# Patient Record
Sex: Male | Born: 1935 | Race: White | Hispanic: No | Marital: Married | Smoking: Former smoker
Health system: Southern US, Community
[De-identification: ages and names within clinical notes are randomized; demographics above are authoritative.]

## PROBLEM LIST (undated history)

## (undated) DIAGNOSIS — R0609 Other forms of dyspnea: Secondary | ICD-10-CM

## (undated) DIAGNOSIS — E039 Hypothyroidism, unspecified: Secondary | ICD-10-CM

## (undated) DIAGNOSIS — E7800 Pure hypercholesterolemia, unspecified: Secondary | ICD-10-CM

## (undated) DIAGNOSIS — I2510 Atherosclerotic heart disease of native coronary artery without angina pectoris: Secondary | ICD-10-CM

## (undated) DIAGNOSIS — J449 Chronic obstructive pulmonary disease, unspecified: Secondary | ICD-10-CM

## (undated) DIAGNOSIS — R200 Anesthesia of skin: Secondary | ICD-10-CM

## (undated) DIAGNOSIS — I219 Acute myocardial infarction, unspecified: Secondary | ICD-10-CM

## (undated) DIAGNOSIS — D649 Anemia, unspecified: Secondary | ICD-10-CM

## (undated) DIAGNOSIS — T4145XA Adverse effect of unspecified anesthetic, initial encounter: Secondary | ICD-10-CM

## (undated) DIAGNOSIS — E079 Disorder of thyroid, unspecified: Secondary | ICD-10-CM

## (undated) DIAGNOSIS — E785 Hyperlipidemia, unspecified: Secondary | ICD-10-CM

## (undated) DIAGNOSIS — T39395A Adverse effect of other nonsteroidal anti-inflammatory drugs [NSAID], initial encounter: Secondary | ICD-10-CM

## (undated) DIAGNOSIS — I209 Angina pectoris, unspecified: Secondary | ICD-10-CM

## (undated) DIAGNOSIS — R202 Paresthesia of skin: Secondary | ICD-10-CM

## (undated) DIAGNOSIS — K922 Gastrointestinal hemorrhage, unspecified: Secondary | ICD-10-CM

## (undated) DIAGNOSIS — Z9289 Personal history of other medical treatment: Secondary | ICD-10-CM

## (undated) HISTORY — PX: WRIST HARDWARE REMOVAL: SHX2673

## (undated) HISTORY — PX: WRIST FRACTURE SURGERY: SHX121

## (undated) HISTORY — PX: CARDIAC CATHETERIZATION: SHX172

## (undated) HISTORY — PX: CORONARY ANGIOPLASTY WITH STENT PLACEMENT: SHX49

---

## 1985-05-08 DIAGNOSIS — I219 Acute myocardial infarction, unspecified: Secondary | ICD-10-CM

## 1985-05-08 DIAGNOSIS — T8859XA Other complications of anesthesia, initial encounter: Secondary | ICD-10-CM

## 1985-05-08 HISTORY — DX: Acute myocardial infarction, unspecified: I21.9

## 1985-05-08 HISTORY — DX: Other complications of anesthesia, initial encounter: T88.59XA

## 1985-05-08 HISTORY — PX: CORONARY ARTERY BYPASS GRAFT: SHX141

## 2001-09-03 ENCOUNTER — Encounter: Payer: Self-pay | Admitting: Family Medicine

## 2001-09-03 ENCOUNTER — Encounter: Admission: RE | Admit: 2001-09-03 | Discharge: 2001-09-03 | Payer: Self-pay | Admitting: Family Medicine

## 2001-10-16 ENCOUNTER — Encounter: Admission: RE | Admit: 2001-10-16 | Discharge: 2001-10-16 | Payer: Self-pay | Admitting: Family Medicine

## 2001-10-16 ENCOUNTER — Encounter: Payer: Self-pay | Admitting: Family Medicine

## 2002-09-04 ENCOUNTER — Ambulatory Visit (HOSPITAL_BASED_OUTPATIENT_CLINIC_OR_DEPARTMENT_OTHER): Admission: RE | Admit: 2002-09-04 | Discharge: 2002-09-05 | Payer: Self-pay | Admitting: Orthopedic Surgery

## 2002-09-05 ENCOUNTER — Encounter (INDEPENDENT_AMBULATORY_CARE_PROVIDER_SITE_OTHER): Payer: Self-pay | Admitting: Specialist

## 2003-02-17 ENCOUNTER — Ambulatory Visit (HOSPITAL_BASED_OUTPATIENT_CLINIC_OR_DEPARTMENT_OTHER): Admission: RE | Admit: 2003-02-17 | Discharge: 2003-02-17 | Payer: Self-pay | Admitting: Orthopedic Surgery

## 2003-04-28 ENCOUNTER — Ambulatory Visit (HOSPITAL_COMMUNITY): Admission: RE | Admit: 2003-04-28 | Discharge: 2003-04-28 | Payer: Self-pay | Admitting: Cardiology

## 2003-06-08 ENCOUNTER — Ambulatory Visit (HOSPITAL_COMMUNITY): Admission: RE | Admit: 2003-06-08 | Discharge: 2003-06-09 | Payer: Self-pay | Admitting: Cardiology

## 2003-07-23 ENCOUNTER — Encounter: Payer: Self-pay | Admitting: Cardiology

## 2003-07-23 ENCOUNTER — Ambulatory Visit (HOSPITAL_COMMUNITY): Admission: RE | Admit: 2003-07-23 | Discharge: 2003-07-23 | Payer: Self-pay | Admitting: Cardiology

## 2003-07-27 ENCOUNTER — Inpatient Hospital Stay (HOSPITAL_COMMUNITY): Admission: RE | Admit: 2003-07-27 | Discharge: 2003-07-28 | Payer: Self-pay | Admitting: Cardiology

## 2003-08-10 ENCOUNTER — Encounter: Admission: RE | Admit: 2003-08-10 | Discharge: 2003-08-10 | Payer: Self-pay | Admitting: Family Medicine

## 2003-09-03 ENCOUNTER — Inpatient Hospital Stay (HOSPITAL_COMMUNITY): Admission: AD | Admit: 2003-09-03 | Discharge: 2003-09-07 | Payer: Self-pay | Admitting: Cardiology

## 2004-03-22 ENCOUNTER — Ambulatory Visit (HOSPITAL_COMMUNITY): Admission: RE | Admit: 2004-03-22 | Discharge: 2004-03-22 | Payer: Self-pay | Admitting: Orthopedic Surgery

## 2004-03-22 ENCOUNTER — Encounter (INDEPENDENT_AMBULATORY_CARE_PROVIDER_SITE_OTHER): Payer: Self-pay | Admitting: *Deleted

## 2004-03-22 ENCOUNTER — Ambulatory Visit (HOSPITAL_BASED_OUTPATIENT_CLINIC_OR_DEPARTMENT_OTHER): Admission: RE | Admit: 2004-03-22 | Discharge: 2004-03-22 | Payer: Self-pay | Admitting: Orthopedic Surgery

## 2006-02-23 ENCOUNTER — Inpatient Hospital Stay (HOSPITAL_COMMUNITY): Admission: AD | Admit: 2006-02-23 | Discharge: 2006-02-26 | Payer: Self-pay | Admitting: Internal Medicine

## 2006-05-04 ENCOUNTER — Encounter (INDEPENDENT_AMBULATORY_CARE_PROVIDER_SITE_OTHER): Payer: Self-pay | Admitting: Specialist

## 2006-05-04 ENCOUNTER — Ambulatory Visit (HOSPITAL_BASED_OUTPATIENT_CLINIC_OR_DEPARTMENT_OTHER): Admission: RE | Admit: 2006-05-04 | Discharge: 2006-05-04 | Payer: Self-pay | Admitting: Orthopedic Surgery

## 2006-07-16 ENCOUNTER — Encounter: Admission: RE | Admit: 2006-07-16 | Discharge: 2006-07-16 | Payer: Self-pay | Admitting: Family Medicine

## 2006-07-25 ENCOUNTER — Encounter: Admission: RE | Admit: 2006-07-25 | Discharge: 2006-07-25 | Payer: Self-pay | Admitting: Family Medicine

## 2008-10-30 ENCOUNTER — Encounter: Admission: RE | Admit: 2008-10-30 | Discharge: 2008-10-30 | Payer: Self-pay | Admitting: Orthopedic Surgery

## 2008-11-13 ENCOUNTER — Encounter: Admission: RE | Admit: 2008-11-13 | Discharge: 2008-11-13 | Payer: Self-pay | Admitting: Orthopedic Surgery

## 2008-11-17 ENCOUNTER — Ambulatory Visit (HOSPITAL_BASED_OUTPATIENT_CLINIC_OR_DEPARTMENT_OTHER): Admission: RE | Admit: 2008-11-17 | Discharge: 2008-11-18 | Payer: Self-pay | Admitting: Orthopedic Surgery

## 2009-05-08 HISTORY — PX: COLONOSCOPY W/ POLYPECTOMY: SHX1380

## 2010-01-15 ENCOUNTER — Emergency Department (HOSPITAL_COMMUNITY): Admission: EM | Admit: 2010-01-15 | Discharge: 2010-01-15 | Payer: Self-pay | Admitting: Emergency Medicine

## 2010-05-08 HISTORY — PX: CATARACT EXTRACTION W/ INTRAOCULAR LENS  IMPLANT, BILATERAL: SHX1307

## 2010-09-20 NOTE — Op Note (Signed)
NAMECARLITO, [name]                ACCOUNT NO.:  [id number]   MEDICAL RECORD NO.:  [id number]          PATIENT TYPE:  AMB   LOCATION:  DSC                          FACILITY:  [facility]   PHYSICIAN:  [name]. [name], M.D. DATE OF BIRTH:  Oct 26, 1933   DATE OF PROCEDURE:  11/17/2008  DATE OF DISCHARGE:                               OPERATIVE REPORT   PREOPERATIVE DIAGNOSES:  1. Symptomatic os acromiale left shoulder.  2. MRI documented full-thickness rotator cuff tear supraspinatus,      infraspinatus tendons left shoulder.  3. Mild acromioclavicular degenerative arthritis.  4. Rule out significant biceps pathology.   POSTOPERATIVE DIAGNOSES:  1. Significant adhesive capsulitis.  2. Symptomatic os acromiale.  3. Full-thickness rotator cuff tear.  4. Modest biceps pathology and degenerative labral pathology at 12      o'clock, 1 o'clock, and 2 o'clock anteriorly.   OPERATION:  1. Examination of the left shoulder under anesthesia.  2. Arthroscopic capsulectomy and debridement of labrum and biceps as      well as rotator cuff debridement.  3. Arthroscopic subacromial decompression with os plasty and removal      of osteophyte on posterior portion of acromion with preservation of      acromioclavicular joint capsule to maintain stability of os      acromiale.  4. A hybrid repair of rotator cuff with arthroscopic debridement, use      of arthroscopic burs to decorticate greater tuberosity and open      suture placement through a limited muscle-splitting incision.   SURGEON:  [name]. [name], MD   ASSISTANT:  Annye Rusk, PA-C   ANESTHESIA:  General endotracheal supplemented by an interscalene block.   SUPERVISING ANESTHESIOLOGIST:  [name]. [name], MD   INDICATIONS:  [name] is a 77 year old gentleman well acquainted  with our practice.   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  He presented for evaluation  of a chronically painful left shoulder.  Clinical examination revealed  mild adhesive capsulitis and weakness of abduction and external  rotation.  Plain x-rays revealed an os acromiale.  An MRI confirmed a  rotator cuff tear with full-thickness and modestly retracted,  unfavorable AC anatomy and particularly unfavorable hypertrophic  osteophytes and an unstable os acromiale.   We advised [name] to undergo diagnostic arthroscopy anticipating  probable capsulectomy, debridement of his labrum, debridement of his  cuff, subacromial decompression, debridement of osteophytes on the  adjacent surface of the os acromiale, a choice not to resect the distal  clavicle to maintain stability of the os anterior fragment and rotator  cuff repair.   Given his choice to continue on a lifting capacity we will have a low  threshold to perform a hybrid repair with open suture placement for  maximum strength.   After informed consent [name] is brought to the operating room at  this time.  PROCEDURE:  [name] is brought to the operating room and placed in  supine position upon operating table.   Following an anesthesia consult with Dr. [name], general anesthesia  by endotracheal technique was recommended and accepted.   A left interscalene block was placed in the holding area under Dr.  Thornton Dales direct supervision followed by transfer of [name] to the  operating room and placement in the supine position.  General  endotracheal anesthesia was induced under Dr. Thornton Dales direct  supervision followed by careful positioning in a beach-chair position  with aid of a torso and head holder designed for shoulder arthroscopy.   The entire left upper extremity forequarter prepped with DuraPrep and  draped with impervious  arthroscopy drapes.   The procedure commenced with examination of left shoulder under  anesthesia.  [name] had rather substantial adhesive capsulitis under  anesthesia.  Combined elevation was limited at 155 degrees, external  rotation limited at 10 degrees, internal rotation limited at  approximately 20 degrees.  We did not manipulate his shoulder due to his  os acromiale and documented rotator cuff tear.   The arthroscope was placed with a standard blunt technique through a  posterior portal.  Diagnostic arthroscopy confirmed abundant adhesive  capsulitis granulation tissues and adhesions between the subscapularis  middle glenohumeral ligament and the anterior capsule.  There was a  moderate degree of degenerative type 1 SLAP injury to the labrum and  about a 15% degenerative tear of the biceps adjacent to the superior  labrum.   An anterior portal was created under direct vision followed by use of  suction shaver to debride the adhesive capsulitis tissues, augmented by  use of electrocautery with tenolysis of the subscapularis, release of  the adhesions to the glenohumeral ligaments and capsulectomy.  The  external rotation was increased to 70 degrees after capsular release and  debridement.  The biceps was debrided as was the deep surface rotator  cuff.   The scope was then removed from the glenohumeral joint and placed in a  subacromial space.  There was noted to be a very hypertrophic os  acromiale articulation that was easily visualized from within the bursa.  There was no capsule present on the bursal side.  The AC capsule was  intact and appeared to be stable.   After use of electrocautery to define the margins of the os acromiale  articulation, a suction bur was used to level the osteophyte on the  anterior and adjacent posterior fragments.  Care was taken to preserve  all the capsular attachments superiorly.  The suction shaver was then  used to debride bursa and the  rotator cuff tear easily visualized.  There was a two tendon tear involving most of the supraspinatus and the  anterior fibers of the infraspinatus.  This was debrided with a suction  shaver.   Given Mr. Sally choice to be an active worker at age 50 and a lifting  capacity in my judgment open technique with precise placement of anchors  and a double-row repair was indicated.   We proceeded to complete decortication of the footprint for rotator cuff  repair with the arthroscopic bur followed by a muscle-splitting  incision.  A suction shaver was used to debride redundant bursa.  The  resection at the os acromiale was very satisfactory.   A two row repair was created with a 5.5-mm PEEK corkscrews placed at the  medial footprint followed by placement of four mattress sutures that  were tied with open technique followed by use of a swivel lock  posteriorly and a PushLock anteriorly for an over-the-top crisscross  repair.  An excellent footprint was achieved.  The dog ears were  debrided with a rongeur.  The scope was then replaced in the joint  assuring that there were no sutures following the long head of the  biceps or causing other problems.  The joint was thoroughly lavaged as  was subacromial space.  The muscle split was repaired with simple suture  of 0 Vicryl followed by repair of the skin with subcutaneous suture of 2-  0 Vicryl and intradermal 3-0 Prolene.   Mr. Ewart tolerated the surgery and anesthesia well.  He was placed in a  DonJoy shoulder immobilizer, awakened from general anesthesia and  transferred to the recovery room with stable vital signs.   For aftercare he will be provided p.o. and IV Dilaudid as well as IV  Ancef 1 gram q.8 hours x3 doses as a prophylactic antibiotic.      [name] [name], M.D.  Electronically Signed     RVS/MEDQ  D:  11/17/2008  T:  11/18/2008  Job:  [id number]   cc:   Donia Guiles, M.D.  Nanetta Batty, M.D.

## 2010-09-23 NOTE — Op Note (Signed)
[name], [name]                          ACCOUNT NO.:  [id number]   MEDICAL RECORD NO.:  [id number]                   PATIENT TYPE:  INP   LOCATION:  2001                                 FACILITY:  [facility]   PHYSICIAN:  Graylin Shiver, M.D.                DATE OF BIRTH:  08/18/1933   DATE OF PROCEDURE:  09/07/2003  DATE OF DISCHARGE:  09/07/2003                                 OPERATIVE REPORT   PROCEDURE PERFORMED:  Colonoscopy.   INDICATIONS FOR PROCEDURE:  Iron deficiency anemia.   Informed consent was obtained after explanation of the risks of bleeding,  infection, and perforation.   PREMEDICATIONS:  The procedure was done immediately after an  esophagogastroduodenoscopy with no additional medication given.   DESCRIPTION OF PROCEDURE:  With the patient in the left lateral decubitus  position, a rectal exam was performed and no masses were felt.  The Olympus  colonoscope was inserted into the rectum and advanced around the colon to  the cecum.  Cecal landmarks were identified.  The cecum and ascending colon  were normal.  The transverse colon normal.  The descending colon, sigmoid  and rectum were normal.  The patient tolerated the procedure well without  complications.   IMPRESSION:  Normal colonoscopy to the cecum.                                               Graylin Shiver, M.D.    [name]  D:  09/08/2003  T:  09/08/2003  Job:  [id number]

## 2010-09-23 NOTE — Op Note (Signed)
NAMEEDU, ON                ACCOUNT NO.:  [id number]   MEDICAL RECORD NO.:  [id number]          PATIENT TYPE:  AMB   LOCATION:  DSC                          FACILITY:  [facility]   PHYSICIAN:  [name]. 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  [name] is also status post a second procedure for removal of one AcuTrak  screw and removal of a loose body noted on the dorsal aspect of his left  wrist with release of the left ring finger A-1 pulley on February 17, 2003.  He subsequently did well for one year until recently developing acute pain  in his left wrist.  He was seen on February 10, 2004 at which time x-ray  suggested a mature fusion at his midcarpal joint, however, he had  progressive narrowing at the dorsal aspect of his radiocarpal articulation,  primarily the radiolunate articulation, suggestive of progressive  radiocarpal arthrosis with pain despite what appeared to be a satisfactory  capitolunate arthrodesis with a standard AcuTrak screw.  A lidocaine block  of the wrist in the office completely relieved his pain.  I recommended that  he proceed with arthrodesis of his wrist.  He thought this over for a period  of four weeks and elects to proceed with arthrodesis of his left wrist at  this time anticipating a possible autogenous iliac graft versus allograft.   Preoperatively, we advised that we may remove the AcuTrak screw  if it  impedes our ability to obtain a satisfactory internal fixation construct.  [name] was status post coronary intervention in the spring of  2005.  Therefore, we obtained a cardiac clearance from Antionette Char, MD at  [facility].  This was obtained on February 15, 2004 and is  currently documented in the chart.  After informed consent [name] is  brought to the operating room at this time anticipating radiocarpal  arthrodesis with autogenous and/or allograft bone graft.   Preoperatively, he was apprised of the potential risks and benefits  including infection, possible failure of the hardware, possible failure to  obtain a satisfactory fusion and the need for possible secondary procedures.  Questions were invited and answered.   POSTOPERATIVE DIAGNOSIS:   OPERATION PERFORMED:   SURGEON:  [name]. [name], M.D.   DESCRIPTION OF PROCEDURE:  [name] was brought to the operating room  and placed in supine position on the operating table. Following induction of  general anesthesia by endotracheal technique, the left arm was prepped with  Betadine soap and solution and sterilely draped with a pneumatic tourniquet  upon the proximal brachium.  The left anterior iliac crest region was  likewise prepped with Betadine soap and solution and sterilely  draped with  towels and an [place] Steri-drape.   The procedure commenced with exsanguination of the left hand and arm with an  Esmarch bandage and inflation of the arterial tourniquet to 220 mmHg.  The  prior dorsal surgical scar was excised and the extensor tendons exposed by  release of the retinaculum on the radial aspect of the fourth dorsal  compartment.  The extensor tendons were retracted in an ulnar direction and  the dorsal aspect of the carpus explored.  The third dorsal compartment was  released and the extensor pollicis longus retracted radially.  A  subperiosteal elevation of the second dorsal compartment allowed inspection  of the radiocarpal articulation.  The space created by prior resection of  the scaphoid had filled in with scar.  This was  carefully resected with a  rongeur.  The midcarpal fusion had what appeared to be a secondary fracture  with probable avascular necrosis of the dorsal aspect of the lunate.  The  AcuTrak screw was slightly loose within the lunate. There was clearly false  motion present.  This is distinctly different from what we identified at the  time of his procedure of October 2004.  I question whether or not the same  microvascular process that led to the Prisers disease may have now claimed  the lunate.   The lunate was morcellized on its dorsal aspect to create a bone graft and  the articular surface of the capitate, lunate and distal radius were all  meticulously cleared of hyaline cartilage utilizing a curette and a small  osteotome.  A small osteotome was then used to fish scale the end of the  radius to bleeding cancellous bone and likewise the proximal pole of the  lunate and exposed portion of capitate were thoroughly roughened with an  osteotome and fine rongeur.  The AcuTrak screw was unwound with a hemostat  and removed.  A short ASIF titanium wrist fusion plate was selected the  appropriate size and the distal radius contoured with resection of Lister's  tubercle and a dorsal wedge of bone to appropriately contour the plate.  The  bone graft was morcellized and used to fill the cavity created by scaphoid  excision.  The wrist was placed at approximately 15 degrees of dorsiflexion  and neutral deviation and the plate was applied with standard ASIF technique  utilizing x-ray control of screw length and plate position.  The bone graft  was then tamped tightly and additional allograft was used to pack into all  the remaining spaces at the capsule at the site of the prior scaphoid  excision.  The capsule was then repaired over the plate with mattress  sutures of 3-0 Ethibond and the extensor retinaculum was reconstructed utilizing a rotation flap of the radial and ulnar portions of the   retinaculum to accommodate the bulk from the plate.  The wound was  thoroughly lavaged with sterile saline followed by release of the  tourniquet.  Some venous bleeding proximally was controlled with a bipolar  forceps.  Once hemostasis was achieved, the wound was closed with subdermal  sutures of 4-0 Vicryl and intradermal 3-0 Prolene.  A voluminous gauze  dressing was applied with a sugar tong splint maintaining the wrist in a  slightly supinated posture.  There were no apparent complications.   TOURNIQUET TIME:  81 minutes at 220 mmHg.   ESTIMATED BLOOD LOSS:  20 mL.   [name] will be admitted to the recovery care center for  observation of  his vital signs.  We anticipate IV PCA morphine and p.o. and IV Dilaudid for  pain as well as Ancef 1 g IV every eight hours as an IV prophylactic  antibiotic.  We anticipate discharge in 24 hours.  We have encouraged him to  avoid smoking and other activities that may impair fusion.      Robe   RVS/MEDQ  D:  03/22/2004  T:  03/22/2004  Job:  [id number]

## 2010-09-23 NOTE — Op Note (Signed)
[name], DUROSS                          ACCOUNT NO.:  [id number]   MEDICAL RECORD NO.:  [id number]                   PATIENT TYPE:  AMB   LOCATION:  DSC                                  FACILITY:  [facility]   PHYSICIAN:  [name]. [name]., M.D.          DATE OF BIRTH:  [birth year]   DATE OF PROCEDURE:  02/17/2003  DATE OF DISCHARGE:                                 OPERATIVE REPORT   PREOPERATIVE DIAGNOSES:  1. Chronic pain, left dorsal wrist with blue spotting noted on the dorsal     aspect of the lunate and scaphoid excision cavity.  2. 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  3. Retained hardware, status post ulnar forebone fusion.  4. Chronic stenosing tenosynovitis, left ring finger at A1 pulley.   POSTOPERATIVE DIAGNOSES:  1. Chronic pain, left dorsal wrist with blue spotting noted on the dorsal     aspect of the lunate and scaphoid excision cavity.  2. 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  3. Retained hardware, status post ulnar forebone fusion.  4. Chronic stenosing tenosynovitis, left ring finger at A1 pulley.   OPERATION:  1. Through a dorsal incision arthrotomy of the left wrist, with removal of     an 8.0 mm x 6.0 mm osseous loose body with synovectomy of scaphoid facet.  2. Pisiform excision through separate left volar ulnar incision.  3. Removal of Acutrak screw through the volar ulnar incision.  4. Release of A1 pulley, left ring finger through third separate palmar     incision.   SURGEON:  [name]. [name], M.D.   ASSISTANT:  [name]. [name], P.A.-C.   ANESTHESIA:  General by LMA.   ANESTHESIOLOGIST:  [name]. [name], M.D.   INDICATIONS FOR PROCEDURE:  The patient is a 77 year old gentleman referred  for the evaluation and management of a painful left wrist.  A clinical  examination revealed a chronic slack wrist deformity.  He also was noted to  have mild stenosing  tenosynovitis.  In April 2004, he underwent a scaphoid excision and ulnar forebone fusion.  Postoperatively he had some residual dorsal pain and swelling, and was noted  to have a loose body on the dorsal radial aspect of his lunate.  This was  observed for six months, and continued to be painful.  During his  rehabilitation he also complained of pain on the ulnar aspect of his wrist,  and was noted to have pisotriquetral arthrosis.  We recommended proceeding  to the operating room at this time for an arthrotomy on the dorsal aspect of  the wrist, to remove his loose body and a pisiform excision.  We anticipated  a removal of the Acutrak screw from the pisotriquetral approach, and in the  preoperative holding area the patient noted persistent triggering of his  left ring finger.  Therefore he requested that we also proceed with a  release of the A1  pulley and a correction of his chronic stenosing  tenosynovitis.   DESCRIPTION OF PROCEDURE:  The patient is brought to the operating room and  placed in the supine position on the operating room table.  Following the  induction of general anesthesia by LMA, the left arm was prepped with  Betadine soap and solution, and sterilely draped.  A pneumatic tourniquet  was applied to the proximal left arm.  Following the exsanguination of the  left arm with an Esmarch bandage, an arterial tourniquet was placed to 220  mmHg.  The procedure commenced with an excision of a portion of the dorsal  scar from the previous ulnar forebone fusion.  The subcutaneous tissues were  carefully divided, revealing the extensor retinaculum.  This was thickened  to approximately 3.0 mm in width.  The tendons of the fourth dorsal  compartment were retracted in an ulnar direction, and a transverse  arthrotomy was created directly over the lunate facet, taking care to spare  the dorsal radial carpal ligaments.  The loose body was palpated with the  aid of a 2.0 mm  osteotome, and sharp Freers.  This was circumferentially  dissected and excised.  The synovium that had filled the scaphoid facet was resected, to allow  visualization of other possible loose bodies.  This area was carefully  palpated, and no other predicaments were noted.  The wound was thoroughly  irrigated, followed by a repair of the capsule with a suture of #3-0  Ethibond, followed by a repair of the extensor retinaculum with mattress  sutures of #3-0 Ethibond.  The knots buried, and the repair of the skin with  intradermal #3-0 Prolene.  Attention was then directed to the palmar aspect of the wrist.  A  curvilinear incision was fashioned along the ulnar proximal border of the  pisiform.  This was developed, revealing the flexor carpal ulnaris.  A fine  osteotome was used to perform a subperiosteal, subcapsular, and subtendinous  resection of the pisiform.  The pisiform was defined on its palmar, radial,  distal, proximal and ulnar surfaces, followed by the use of a rongeur to  remove it piecemeal.  The removal of a portion of the high articular  cartilage of the triquetrum that was no longer articulating allowed  visualization of the Extract screw.  The buried screw was then removed with  the aid of an Extract solid screwdriver without difficulty.  The wound was  thoroughly lavaged with sterile saline, followed by repair of the flexor  carpi ulnaris to the fascia, supporting the tendon, with a mattress suture  of #3-0 Ethibond.  The hypothenar muscles that were split slightly to  facilitate pisiform excision were repaired with a mattress suture of #3-0  Ethibond, knot buried.  The skin was then repaired with intradermal #3-0  Prolene.  Attention was then directed to the palm.  A short transverse incision was  created directly over the palpably thickened A1 pulley of the left ring  finger.  The subcutaneous tissues were carefully divided, revealing hypertrophic palmar fascia.  The  fascia was dissected.  The A1 pulley was  isolated and split with scissors.  A small A0 pulley was noted and resected.  Thereafter a free range of motion of the fingers recovered.  The tendons  were delivered an incidental synovectomy was accomplished with rongeurs.  The wound was then repaired with interrupted sutures of #5-0 nylon.  A  compressive dressing was applied with a volar plaster splint, maintaining  the  wrist in 5 degrees of dorsiflexion.                                                 [name] [name]., M.D.    RVS/MEDQ  D:  02/17/2003  T:  02/17/2003  Job:  [id number]

## 2010-09-23 NOTE — Op Note (Signed)
[name], [name]                          ACCOUNT NO.:  [id number]   MEDICAL RECORD NO.:  [id number]                   PATIENT TYPE:  INP   LOCATION:  2001                                 FACILITY:  [facility]   PHYSICIAN:  Graylin Shiver, M.D.                DATE OF BIRTH:  January 21, 1934   DATE OF PROCEDURE:  09/07/2003  DATE OF DISCHARGE:  09/07/2003                                 OPERATIVE REPORT   PROCEDURE:  Esophagogastroduodenoscopy with biopsy for CLOtest.   INDICATIONS FOR PROCEDURE:  Iron deficiency anemia.   CONSENT:  Informed consent was obtained after explanation of the risks of  bleeding, infection, and perforation.   PREMEDICATION:  Fentanyl 60 mcg IV, Versed 6 mg IV.   PROCEDURE IN DETAIL:  With the patient in the left lateral decubitus  position, the Olympus gastroscope was inserted into the oropharynx and  passed into the esophagus.  It was advanced down the esophagus and into the  stomach and into the duodenum.  The second portion of the bulb and the  duodenum were normal.  The stomach showed a small antral erosion.  There was  also a focal area of erythema compatible with gastritis along the greater  curvature.  No other abnormalities were seen in the stomach.  The fundus and  cardia looked normal.  The esophagus looked normal.  Biopsy for CLOtest was  obtained from the stomach.  The patient tolerated the procedure well without  complications.   IMPRESSION:  One small antral erosion and a focal area of gastritis in the  stomach.   PLAN:  The CLOtest will be checked.                                               Graylin Shiver, M.D.    [name]  D:  09/08/2003  T:  09/08/2003  Job:  [id number]

## 2010-09-23 NOTE — Discharge Summary (Signed)
[name], [name]                          ACCOUNT NO.:  [id number]   MEDICAL RECORD NO.:  [id number]                   PATIENT TYPE:  INP   LOCATION:  2001                                 FACILITY:  [facility]   PHYSICIAN:  Jackie Plum, M.D.             DATE OF BIRTH:  02-Apr-1934   DATE OF ADMISSION:  09/03/2003  DATE OF DISCHARGE:  09/07/2003                                 DISCHARGE SUMMARY   DISCHARGE DIAGNOSES:  1. Iron deficiency anemia.     A. Esophagogastroduodenoscopy done by Dr. Evette Cristal on Sep 07, 2003, notable        for antral erosion and focal area of gastritis in the stomach.        CLOtest results is pending.  Outpatient follow-up recommended.     B. Colonoscopy done by Dr. Evette Cristal on Sep 07, 2003, was within normal        limits.  The patient is being discharged home on iron with follow-up        appointment to see Dr. Evette Cristal in two weeks.  2. History of coronary artery disease, status post stenting.  3. History of inguinal hernia.   DISCHARGE MEDICATIONS:  The patient is to resume all of his preadmission  medications.  New medicines include:  1. Iron sulfate 325 mg p.o. t.i.d.  2. Protonix 40 mg p.o. daily.   ACTIVITY:  As tolerated.   DIET:  Cardiac diet.   DISCHARGE INSTRUCTIONS:  The patient is to call M.D. with any symptoms of  feeling dizzy, shortness of breath, chest pain, dark-colored stools, or  bright red blood per rectum.  He will follow up with Dr. Evette Cristal as stated  above.  He is to follow up with cardiologist, Dr. Aleen Campi in two weeks.  The patient is to call for appointment.   REASON FOR ADMISSION:  Symptomatic anemia, rule out MI.  The patient is a 77-  year-old gentleman with a history of CAD, who presented with chest pain,  tightness, and dyspnea.  He has experienced some easy fatigability and  dyspnea on exertion.  The patient was noted to have a hemoglobin of 6.9  which was macrocytic in nature and was admitted for further evaluation of  his  chest pressure and anemia.  He had been on Plavix and aspirin.   HOSPITAL COURSE:  Admitted to the [facility].  There was no  evidence of coffeeground emesis, melena, or hematochezia while he was in the  hospital.  His hemodynamics remained stable.  Iron studies were positive for  iron deficiency anemia.  He was seen by Dr. Aleen Campi who agreed that the  patient's symptoms were likely secondary to his anemia and recommended GI  follow-up.  EGD and colonoscopy were done with results as indicated above.  The patient is discharged home in stable and satisfactory condition.  He was  cleared to be okay to take his Plavix and is  going to be following up with  Dr. Evette Cristal as noted in about two weeks.   CONSULTATIONS:  Dr. Aleen Campi and Dr. Evette Cristal.   PROCEDURE:  EGD and colonoscopy as stated above.   CONDITION ON DISCHARGE:  Improved and satisfactory.                                                Jackie Plum, M.D.    GO/MEDQ  D:  10/29/2003  T:  10/29/2003  Job:  [id number]   cc:   Aram Candela. Aleen Campi, M.D.  [address]  [place]  [place] [address]  Fax: [phone]   Graylin Shiver, M.D.  [phone] [address]  [address]  [place], [place] [address]  Fax: [phone]

## 2010-09-23 NOTE — H&P (Signed)
[name], [name]                          ACCOUNT NO.:  [id number]   MEDICAL RECORD NO.:  [id number]                   PATIENT TYPE:  INP   LOCATION:  2001                                 FACILITY:  [facility]   PHYSICIAN:  [name], D.O.                 DATE OF BIRTH:  April 15, 1934   DATE OF ADMISSION:  09/03/2003  DATE OF DISCHARGE:                                HISTORY & PHYSICAL   PRIMARY CARE PHYSICIAN:  Donia Guiles, M.D.   CARDIOLOGIST:  Aram Candela. Tysinger, M.D.   CHIEF COMPLAINT:  Chest tightness.   HISTORY OF PRESENT ILLNESS:  This is a 77 year old Caucasian male with  history of coronary artery disease, status post recent PTCA and stenting of  his LAD on July 27, 2003 when he presented with anginal symptoms and had  not had resolution of these symptoms since.  He describes exertional chest  pain described across his chest, similar to previous pain associated with  his MI back in January 2005.  He states that he is unable to walk even 50-60  feet without becoming severely short of breath.  He was being evaluated in  his cardiologist's office today and being set up for a cardiac  catheterization on Sep 06, 2003 when repeat screening labs revealed his  hemoglobin to be low, at least 7.2.  I do not have these values with me  right now.  Dr. Adelene Idler office called me and asked me to admit and  evaluate [name] in the hospital, and transfuse him as indicated.  Mr.  [name] has had no complaints of bright red blood per rectum or hematemesis,  melena, coffee grounds emesis.  He denies any nausea, vomiting, or diarrhea.  In fact, he reports some constipation.  Probably three or four weeks ago he  noticed some dark stool; otherwise he has been stable with the exception of  his slowly worsening dyspnea on exertion and exertional fatigue.  He states  that he is not quite recovered from his MI in January 2005.  In any event,  he is directly admitted to 2000 for evaluation and  management and  transfusion.   PAST MEDICAL HISTORY:  1. Coronary artery disease with single-vessel CABG prompted by rupture     during a PTCA in 1987.  He has undergone cardiac catheterization in     January 2005 with two stents placed at that time.  Subsequently underwent     cardiac catheterization July 27, 2003, placing a stent in the LAD.     Review of the catheterization report from the last time, his RCA graft     appeared normal.  Left circumflex was normal and reported was a PTCA and     stent in his LAD at the conclusion of that procedure.  2. Hyperlipidemia.   PAST SURGICAL HISTORY:  1. CABG and left wrist surgery in the distant past.  2. He has history of nonincarcerated right inguinal hernia.   MEDICATIONS:  1. Plavix 75 mg p.o. daily.  2. Welchol 625 mg p.o. q.a.m.  3. Aspirin 325 mg p.o. q.a.m.  4. Vytorin 10/50 q.p.m.   ALLERGIES:  No known drug allergies.   SOCIAL HISTORY:  He quit tobacco three to four years ago after 1-1/2 packs  per day for 25 years.  He denies alcohol.  He has three children alive and  well.  He is former Engineer, materials.   FAMILY HISTORY:  His mother died at age 13.  No early heart disease or  cancer that he is aware.  Father died in his 29's of emphysema.   REVIEW OF SYSTEMS:  He denies any fevers, chills, night sweats.  He reports  fatigue mostly associated with exertion.  He denies anorexia, weight loss,  appetite loss. He does report dizziness.  However, this again is with  exertion.  He reports chest pain and tightness across his chest as described  in the history of present illness.  He denies productive cough.  He  describes tightness in his chest when he takes a deep breath.  He denies any  productive cough or upper respiratory symptoms.  He denies any nausea,  vomiting, diarrhea.  Denies any abdominal pain.  Denies any melena,  hematochezia, coffee ground emesis.  Denies any abdominal swelling.  Denies  any lower extremity  swelling, dysuria, back pain.   PHYSICAL EXAMINATION:  VITAL SIGNS:  The patient's vital signs revealed  blood pressure 106/99, respirations 20, heart rate 71, temperature 97.3  degrees.  GENERAL:  He is alert, in no acute distress, and not tachypneic.  NECK:  Supple.  No pronounced JVD.  No palpable thyromegaly.  CARDIOVASCULAR:  Normal S1 and S2 without S3 or S4 to auscultation.  PULMONARY:  Diminished breath sounds, some rales, with much more decreased  breath sounds on the right than the left.  There was no dullness to  percussion.  ABDOMEN:  Soft.  There was a palpable liver edge below the right costal  margin suggestive of hepatomegaly.  EXTREMITIES:  No cyanosis or clubbing.  Positive trace bipedal edema.   LABORATORY DATA:  The only laboratory data available at this time include  CBC which reveals a WBC of 5.6, hemoglobin 6.9, hematocrit 20, MCV 82,  platelet count 272,000.  Comprehensive metabolic panel reveals sodium 161,  potassium 2.6, chloride 105, CO2 25, BUN 12, creatinine 1, glucose 115,  total bilirubin 0.4, alk phos 46, AST 42, ALT 16, albumin 3.6, calcium 8.5,  LDH 131.  EKG and chest x-ray are pending at this time.   IMPRESSION:  1. Anemia with borderline low MCV and Hemoccult-negative stool.  2. Angina with exertion.  3. Underlying coronary artery disease.  4. History of tobacco abuse.  5. Increased lipids.   PLAN:  1. Admit [name] for blood transfusion and anemia evaluation.  I have     notified Dr. Evette Cristal for colonoscopy as     [name] has not undergone colonoscopy in the past.  2. Check post transfusion H&H on studies and supplement iron if he is iron     deficient.  3. Further plans pending results of orders.  [name], D.O.    ESS/MEDQ  D:  09/03/2003  T:  09/03/2003  Job:  [id number]   cc:   Aram Candela. Aleen Campi, M.D.  [address] [place]  [place]  [place] [address] Fax: [phone]   Donia Guiles, M.D.  [address] [place]  [place] [address]  Fax: [phone]

## 2010-09-23 NOTE — H&P (Signed)
[name], [name]                ACCOUNT NO.:  [id number]   MEDICAL RECORD NO.:  [id number]          PATIENT TYPE:  INP   LOCATION:  2002                         FACILITY:  [facility]   PHYSICIAN:  Lonia Blood, M.D.      DATE OF BIRTH:  1933/10/29   DATE OF ADMISSION:  02/23/2006  DATE OF DISCHARGE:                                HISTORY & PHYSICAL   PRIMARY CARE PHYSICIAN:  Dr. Lucas Mallow, [facility]   PRESENTING COMPLAINT:  Persistent pneumonia, weight loss x2 weeks.   HISTORY OF PRESENT ILLNESS:  The patient is a 77 year old gentleman with  history of coronary artery disease and hypertension who has apparently been  having cough, fever, and weight loss that has gone on for like 3 weeks.  His  cough has been persistent and was seen in the office multiple times.  On  February 12, 2006, he had a chest x-ray done that showed right upper lobe  pneumonia.  He was started on Avelox and was treated as an outpatient but he  soon started to fail the outpatient treatment.  He has continued to have  fever including some night sweats, and had a PPD placed in the office which  came back as negative.  He was subsequently sent as a direct admit from Dr.  Pollie Friar office for admission in the hospital.  Patient has been having dry  cough for the most part, with scanty sputum, with no blood.  He had chest  pain associated with it.  He has lost between 12 and 20 pounds as indicated.  Denied any nausea, vomiting, no rhinorrhea, nose clear.   PAST MEDICAL HISTORY:  Significant for:  1. Coronary artery disease status post CABG in 1997 and subsequent PTCA 2      years ago.  2. History of hypertension.  3. History of iron deficiency anemia.  4. Abnormal PTT, liver function tests.  5. History of arrhythmias with palpitations.  6. Postural hypotension.   ALLERGIES:  Patient is allergic to CODEINE.   MEDICATIONS:  Include:  1. Crestor 5 mg daily.  2. Aspirin 81 mg daily.  3. Mucinex 600 mg  2 tablets b.i.d.  4. Colace 100 mg 2 tablets t.i.d.   SOCIAL HISTORY:  Patient lives in [place].  He is married.  Quit tobacco  20 years ago.  No alcohol use.   FAMILY HISTORY:  His mother died at the age of 1 from cancer.  Father died  at age 63 emphysema and some pneumonia.  Grandfather also died from  pneumonia.   REVIEW OF SYSTEMS:  12-point review of systems is mainly from HPI.   PHYSICAL EXAMINATION:  On his exam today, temperature 97.7.  Blood pressure  112/62.  Pulse 77.  Respiratory rate 20.  Sats 95% on room air.  His weight  is 134.5 pounds.  GENERALLY:  Patient is awake, alert, oriented, pleasant, in no acute  distress.  HEENT:  PERRLA.  EOMI.  NECK:  His neck is supple.  No JVD.  Mild cervical lymphadenopathy.  CHEST:  Clear to auscultation bilaterally.  No  wheezes.  No rales.  CARDIOVASCULAR SYSTEM:  S1, S2.  No murmurs.  ABDOMEN:  His abdomen is soft, nontender, with positive bowel sounds.  EXTREMITIES:  His extremities show no edema, cyanosis or clubbing.   LABORATORY DATA:  Currently pending.  Chest x-ray from February 12, 2006, showed right upper lobe infiltrate.   ASSESSMENT:  Therefore, this is a 77 year old gentleman with what appeared  to be persistent pneumonia.  Differentials are wide.  This could be a  nonbacterial pneumonia or a pneumonia that is resistant to quinolone which  he has been on.  Also could be TB, but his PPD has been negative.  It could  also be a BOOP.  Cancer can not be entirely ruled out, although chest x-ray  has shown only pneumonia.   PLAN:  Plan therefore will be:  1. Persistent pneumonia.  Will admit the patient, get a chest x-ray, but      follow that up with chest CT without contrast to further characterize      the lesion if still there.  Treat him now with IV Zosyn while waiting      for sputum culture and blood cultures to come back.  2. Hypertension.  Blood pressure is fine, and we will continue his home       medication.  3. Dyslipidemia.  Again, continue him on his home medication.  4. Coronary artery disease.  Patient has just had a work up done including      a Cardiolite that was negative, and he has no chest pain at this point,      so we will elect to observe him in the hospital.  Otherwise, we will      give patient supportive therapy for his cough, including Mucinex.      Lonia Blood, M.D.  Electronically Signed     LG/MEDQ  D:  02/23/2006  T:  02/24/2006  Job:  [id number]

## 2010-09-23 NOTE — Cardiovascular Report (Signed)
[name], [name]                          ACCOUNT NO.:  [id number]   MEDICAL RECORD NO.:  [id number]                   PATIENT TYPE:  OIB   LOCATION:  6524                                 FACILITY:  [facility]   PHYSICIAN:  John R. Tysinger, M.D.              DATE OF BIRTH:  [birth year]   DATE OF PROCEDURE:  07/27/2003  DATE OF DISCHARGE:  07/28/2003                              CARDIAC CATHETERIZATION   PROCEDURES:  1. Left heart catheterization.  2. Coronary cineangiography.  3. Vein graft cineangiography.  4. Left ventricular cineangiography.  5. Angioplasty with percutaneous transluminal coronary angioplasty and bare     metal stent of the distal left anterior descending coronary artery.  6. Angioseal of the right femoral artery.  7. Intracoronary nitroglycerin injection.   INDICATION FOR PROCEDURE:  This 77 year old male has a history of coronary  artery disease and is status post single-vessel vein graft bypass surgery in  1991.  He has had episodes of chest pain since then, finding good appearance  of his vein graft.  He had an unstable episode of chest pain in January 2005  and cardiac catheterization revealed a significant lesion in his mid- to  distal LAD.  He underwent angioplasty and had two short drug-eluting stents  placed with the final result appearing normal.  Post catheterization he has  continued to have chest pain and a repeat Cardiolite study showed evidence  of ischemia with the computer suggesting a tiny focus of statistically-  significant reversible perfusion in the apical anteroseptal wall.  With his  continued chest pain and abnormal finding on his Cardiolite study, we  scheduled him for repeat catheterization and possible angioplasty.   PROCEDURE:  After signing an informed consent, the patient was premedicated  with 5 mg of Valium by mouth and brought to the cardiac catheterization lab.  His right groin was prepped and draped in a sterile fashion and  anesthetized  locally with 1% lidocaine.  A 6  French introducer sheath was inserted  percutaneously into the right femoral artery.  Six [place] #4 Judkins  coronary catheters were used to make injections into the native coronary  arteries.  The right coronary catheter was used to make an injection into  the right coronary artery vein graft.  A 6 French pigtail catheter was used  to measure pressures in the left ventricle and aorta and to make a midstream  injection into the left ventricle.  After noting a severe stenosis distal to  the prior drug-eluting stents placed in the mid- to distal LAD, we reviewed  his cines and history with Charlies Constable, M.D., who recommended angioplasty  of the segment after the stents in the distal LAD.  His primary  recommendation was to use a small balloon to attempt a good result using a  balloon only.  If there was residual area not successful, then he  recommended using a bare metal  stent of small size, 2.0-2.25, to cover the  area that would not clear up with the balloon only.  This was in hopes of  being able to use a shorter stent.  With this recommendation we discussed  alternatives with the patient for further medical treatment versus  intervention today, and the patient readily requested intervention to help  in his clinical status.  We then selected a 6 [place] guide catheter,  which was advanced to the root of the aorta.  After engaging the ostium of  the left main coronary artery, we inserted a short HTF guidewire through the  catheter and into the LAD.  With mild difficulty it was passed through the  middle segment and through the distal lesion and the tip was positioned  distally at the apex.  We then selected a 2.0 x 20 mm Maverick balloon  catheter, which after proper preparation was inserted over the guidewire and  passed into the distal LAD lesion.  Two inflations were made, the first at 8  atmospheres for 63 seconds and the second at 12  atmospheres for two minutes.  After this second inflation, injection again into the left coronary artery  showed good results proximally and distally but with a residual 40%  eccentric lesion in the middle segment of the dilation.  With this finding  we then selected a 2.25 x 18 mm Vision bare metal stent system and after  proper preparation, it was inserted over the guidewire and positioned within  the area of failure to dilate with the balloon.  We then deployed this stent  with a maximum pressure of 10 atmospheres for a total of 23 seconds.  After  the deployment, we then withdrew the stent deployment system and injection  again into the left coronary artery showed an excellent angiographic result  in the stented area but with continuing narrowing just distal to the stent.  We then reinserted the 2.0 x 20 mm balloon catheter and only exposing it  distally for a 5-8 mm segment. We made two further inflations, the first at  6 atmospheres for 65 seconds and the second at 4 atmospheres for three  minutes.  After this final inflation the 2.0 balloon catheter was removed  and injections again into the left coronary artery showed an excellent  angiographic result with 0% residual lesion and normal TIMI-3 antegrade  flow.  There was no evidence of dissection and no evidence for clot.  The  patient tolerated the procedure well and no complications were noted.  At  the end of the procedure the catheter and sheath were removed from the right  femoral artery and hemostasis was easily obtained with an Angioseal closure  system.   MEDICATIONS GIVEN:  1. Heparin 3800 units IV.  2. Integrilin drip per pharmacy protocol.   CORONARY CINEANGIOGRAPHY FINDINGS:  1. Left coronary artery:  The ostium and left main appear normal.  2. Left anterior descending:  The mid-LAD had minor irregularities.  There     is a large bifurcation in the middle segment with a large diagonal    branch, which appears normal.   The segment that remains in the     interventricular groove has a mild 20% stenosis at its takeoff.  This is     followed by the area of prior stenting with the drug-eluting stents, and     the stented area is normal in appearance.  Just distal to the stent there  is an area approximately 20 mm long that is irregular and has a possible     dissection line and also an area of possible clot.  There was TIMI-2     antegrade flow through this area, filling the distal LAD at the apex.     This appears to be a residual from placing the stents in January 2005.     The distal LAD beyond this irregular area appeared normal.  3. Circumflex coronary artery appears normal.  4. Right coronary artery:  The right coronary artery is totally occluded at     its origin.  5. Right coronary artery vein graft:  The right coronary artery vein graft     is normal in appearance with very good flow into the distal right     coronary artery.   LEFT VENTRICULAR CINEANGIOGRAM:  The left ventricular chamber size appears  normal.  The overall left ventricular contractility is normal.  There is  mild inferobasal hypokinesia.  The ejection fraction was estimated at 60-  70%.   ANGIOPLASTY CINEANGIOGRAPHY:  Cines taken during the angioplasty procedure  showed proper positioning of the guidewire and balloon catheters.  There was  a good balloon form obtained with the 2.0 balloon.  Further cines showed  proper positioning of the bare metal stent system, and the injections  following the bare metal stent deployment showed an excellent angiographic  result within the stent and with a residual narrowed area just distal to the  stent.  Further cines showed proper positioning of the 2.0 balloon with a  good balloon form obtained, and final injections into the left coronary  showed an excellent angiographic result with 0% residual lesion and no  evidence for dissection or clot.  There was normal TIMI-3 antegrade flow.    FINAL DIAGNOSES:  1. Severe stenosis in the distal left anterior descending coronary artery,     95%, distal to the prior drug-eluting stents placed on June 08, 2003.  2. Normal circumflex and proximal left anterior descending coronary artery.  3. Normal appearance of the mid-left anterior descending coronary artery     within the drug-eluting stents.  4. Normal left ventricular function.  5. Normal right coronary artery vein graft, unchanged from prior study.  6. Successful angioplasty with percutaneous transluminal coronary     angioplasty and bare metal stent placement in the distal left anterior     descending coronary artery.  7. Successful Angioseal of the right femoral artery.   DISPOSITION:  Will monitor on the EAD overnight.  Will restart Plavix as he  had been instructed by the surgeon to discontinue the Plavix last week in  anticipation of a hernia repair later this week.  Will also continue the Integrilin drip for a total of 18 hours.                                               John R. Aleen Campi, M.D.    JRT/MEDQ  D:  07/27/2003  T:  07/29/2003  Job:  [id number]   cc:   [facility]

## 2010-09-23 NOTE — Op Note (Signed)
[name], [name]                          ACCOUNT NO.:  [id number]   MEDICAL RECORD NO.:  [id number]                   PATIENT TYPE:  AMB   LOCATION:  DSC                                  FACILITY:  [facility]   PHYSICIAN:  [name]. [name]., M.D.          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   OPERATION PERFORMED:  1. Excision of right scaphoid.  2. Ulnar four bone intercarpal fusion utilizing local bone graft and extract     screw fixation x2.   SURGEON:  [name]. [name], M.D.   ASSISTANT:  Jonni Sanger, P.A.   ANESTHESIA:  General by LMA.   SUPERVISING ANESTHESIOLOGIST:  [name]. [name], M.D.   INDICATIONS FOR PROCEDURE:  The patient is a 77 year old right hand dominant  man retired from [employer].  He has enjoyed playing golf.  He has developed a  painful arthrosis of his right wrist.  He was referred by Dr. Gavin Potters in  [facility] for evaluation of his painful wrist and was noted to have signs  of avascular necrosis of the scaphoid and an advanced collapse pattern  leading to midcarpal arthritis.   Due to disabling pain, he is brought to the operating room at this time  anticipating excision of his right scaphoid, synovectomy and ulnar four bone  fusion.  Preoperatively, he was advised of the potential risks and benefits  of surgery.  Questions were invited and answered.   DESCRIPTION OF PROCEDURE:  The patient was brought to the operating room and  placed in supine position on the operating table.  Following induction  of  general anesthesia by LMA, the left arm was prepped with Betadine soap and  solution and sterilely draped.  1g of Ancef was administered as an IV  prophylactic antibiotic.  Following exsanguination of the limb with an  Esmarch bandage, an arterial tourniquet inflated to  240 mmHg. The procedure commenced with a longitudinal incision over the  fourth dorsal compartment.  The subcutaneous tissues were carefully divided.  The fourth dorsal compartment was incised on its radial border and the  tendons retracted in an ulnar direction.  The dorsal capsule of the wrist  was identified and the posterior interosseous nerve resected.  A transverse  arthrotomy was created across the base of the second, third and fourth  dorsal compartments exposing the proximal pole of the scaphoid and the  midcarpal joint.  The scaphoid was morselized and removed piecemeal with a  rongeur. A complete synovectomy was accomplished.  The midcarpal joint was inspected.  There was bone-on-bone arthropathy of  the capitate lunate articulation.  The triquetral hamate articulation had  nearly normal cartilage.  There was considerable synovitis within the  midcarpal joint.  After thorough synovectomy was accomplished, the adjacent  surfaces of the capitate, lunate, hamate and triquetrum were resected in a  chevron manner with an oscillating saw.  Bone fragments were removed.  A  volar synovectomy was accomplished followed by stacking of the capitate and  hamate over the lunate and triquetrum in the manner of a four bone fusion  followed by fixation with two 0.045 inch Kirschner wires.   A 22.5 mm standard Acutrak screw was used from distal to proximal to secure  the capitate lunate articulation in a neutral position.  A 25 mm Acutrak  screw was used to fix the triquetrum, hamate and capitate from an ulnar  proximal to radial distal direction.   Very satisfactory fixation of the four bones was accomplished.  The  wounds  were irrigated followed by closure of the dorsal capsule with individual  sutures of 3-0 Ethibond followed by repair of the retinaculum with mattress  sutures of 3-0 Ethibond.  The dorsal compartments were anatomically  reconstructed.  Range of motion of the wrist was examined.  Dorsiflexion to  25 degrees and palmar flexion to 45 degrees was noted with radial deviation  of 10 degrees, ulnar deviation 20 degrees.  There were no apparent  complications.   The wound was repaired with subdermal sutures of 4-0 Vicryl and intradermal  3-0 Prolene suture.  A compressive dressing was applied with a volar plaster  splint maintaining the wrist in five degrees dorsiflexion.                                                [name] [name]., M.D.    RVS/MEDQ  D:  09/04/2002  T:  09/04/2002  Job:  [id number]

## 2010-09-23 NOTE — Consult Note (Signed)
NAME:  [name], [name]                          ACCOUNT NO.:  [id number]   MEDICAL RECORD NO.:  [id number]                   PATIENT TYPE:  INP   LOCATION:  2001                                 FACILITY:  [facility]   PHYSICIAN:  Graylin Shiver, M.D.                DATE OF BIRTH:  [birth year]   DATE OF CONSULTATION:  09/04/2003  DATE OF DISCHARGE:                                   CONSULTATION   REASON FOR CONSULTATION:  This patient is a 77 year old male who has been  experiencing chest pressure, tightness, and dyspnea. He saw his cardiologist  for this problem and blood tests revealed that he was anemic. He was  admitted to the hospital last evening for further evaluation. He received  three units of packed red blood cells after he was found to have a  hemoglobin of 6.9. The patient states that he feels much better today after  receiving blood. His stool was heme-negative. He denies seeing any blood per  GI tract. Specifically he denies hematemesis, coffee-ground emesis, melena,  or hematochezia. He does have some occasional heartburn. He states that he  might have had an ulcer years ago. He has never had his colon checked. The  patient has been on Plavix and aspirin.   PAST MEDICAL HISTORY:  Coronary artery disease.   PAST SURGICAL HISTORY:  1. Coronary artery bypass graft in 1987.  2. Coronary artery stent.  3. Inguinal hernia surgery.   ALLERGIES:  No known drug allergies.   SOCIAL HISTORY:  He does not smoke or drink alcohol.   PHYSICAL EXAMINATION:  VITAL SIGNS:  Stable. He does not appear in any acute  distress. He is nonicteric.  HEART:  Regular rhythm. No murmurs.  LUNGS:  Clear.  ABDOMEN:  Bowel sounds normal, soft, nontender, no hepatosplenomegaly.   IMPRESSION:  1. Iron deficiency anemia.  2. Coronary artery disease.   PLAN:  The patient has received three units of packed red blood cells. He is  feeling much better. I will plan to do an EGD and a colonoscopy on  Monday. I  will hold his Plavix and aspirin over the weekend.                                               Graylin Shiver, M.D.    [name]  D:  09/04/2003  T:  09/04/2003  Job:  [id number]   cc:   Aram Candela. Aleen Campi, M.D.  [address]  [place]  [place] [address]  Fax: [phone]   Donia Guiles, M.D.  [address] [place]  [place] [address]  Fax: [phone]

## 2010-09-23 NOTE — Discharge Summary (Signed)
[name], [name]                ACCOUNT NO.:  [id number]   MEDICAL RECORD NO.:  [id number]          PATIENT TYPE:  INP   LOCATION:  2002                         FACILITY:  [facility]   PHYSICIAN:  Mobolaji B. Bakare, M.D.DATE OF BIRTH:  08/21/1933   DATE OF ADMISSION:  02/23/2006  DATE OF DISCHARGE:  02/26/2006                                 DISCHARGE SUMMARY   PRIMARY CARE PHYSICIAN:  Dr. Lucas Mallow   DATE OF ADMISSION:  February 23, 2006   DATE OF DISCHARGE:  February 26, 2006   FINAL DIAGNOSES:  1. Community-acquired pneumonia, failed outpatient treatment.  2. 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   SECONDARY DIAGNOSES:  1. Hypertension.  2. Iron deficiency anemia.  3. History of postural hypotension.  4. CAD.  5. Dyslipidemia.   PROCEDURES:  1. Chest x-ray done on the 19th of October 2007 showed right upper lobe      pneumonia, chronic lung changes, rounded nodular density in the right      lung base which was recommended for followup.  2. CT scan of the chest showed marked erythematous changes, right upper      lobe air space filling and right hilar lymph node which is felt to be      secondary to infectious process although malignancy could not be ruled      out entirely.  3. CT scan of the abdomen and pelvis showed a short segment aneurysm of      the proximal right iliac artery measuring 18 mm.   BRIEF HISTORY:  Mr. Radney is a pleasant 77 year old Caucasian male who  developed fever and cough associated with weight loss for three weeks prior  to  hospitalization.  He was treated with Avelox and patient said this did  not improve the cough.  He appeared to be short-winded and was sent to the  hospital for evaluation.  He had PPD placed in the outpatient center and  this was negative.  According to patient, his appetite has been poor and he  has lost in between 12 to 20 pounds.  On initial evaluation in the emergency  room, temperature was 97.7, blood pressure was 112/62,  pulse of 77, O2 sats  of 97% on room air.  Patient's weight was 134.5 pounds.  Physical  examination was unremarkable except for bibasilar rales.  He was started on  intravenous Zosyn, but failed outpatient treatment of community-acquired  pneumonia.  His initial laboratory data showed a white cell count of 7.3 and  hemoglobin of 10.6.   HOSPITAL COURSE:  1. Community-acquired pneumonia.  Patient responded well to intravenous      Zosyn which he received for approximately 73 hours.  He did not require      home oxygen.  His saturation was maintained on room air above 92%.  He      has been unable to produce sputum for culture.  Eventually, the cough      has subsided.  He was evaluated with a CT scan of the chest which      showed airspace disease in  the right upper lobe and felt to be      infectious process, but malignancy could not be entirely ruled out.      Patient will need to follow up in six to eight weeks with a chest x-ray      and followup CT scan of the chest.  2. Weight loss.  Weight loss is felt to be related to probably      multifactorial.  Patient has underlying excessive amounts of changes on      CT scan and he has poor appetite with the onset of cough and fever.      While in the hospital, his appetite has improved.  He was evaluated      with a CT scan of the abdomen and pelvis to rule out any malignancy or      lymphadenopathy and this was essentially normal although it did show 18      mm right iliac aneurysm.  Patient will follow up with regular weekly      check to ensure improvement in his weight status.  3. Chronic anemia.  Patient does have history of iron deficiency anemia.      He had anemia during this hospitalization which showed a ferritin of      145, vitamin B12 272, TIBC of 232, iron 28%, saturation 12%, folate      7.7.  Patient was continued with iron supplement.  4. All other medical problems were stable during the course of       hospitalization.   DISCHARGE MEDICATIONS:  1. Augmentin 875 mg p.o. b.i.d. for ten days.  2. Aspirin 81 mg daily.  3. Mucinex 600 mg two tablets b.i.d.  4. Tessalon Perles to use as before.   DISCHARGE LABORATORY DATA:  Sodium 142, potassium 4.3, chloride 110, CO2 27,  glucose 83, BUN 8, creatinine 0.8, calcium 8.2, blood cultures have been  negative so far, white cell 7.7, platelets 526, hemoglobin 10.8.   RECOMMENDATIONS:  1. Followup chest x-ray in six to eight weeks.  2. Follow up with Dr. Lucas Mallow in one to two weeks.      Mobolaji B. Corky Downs, M.D.  Electronically Signed     MBB/MEDQ  D:  02/26/2006  T:  02/26/2006  Job:  [id number]   cc:   Jaclyn Prime. Lucas Mallow, M.D.

## 2010-09-23 NOTE — Op Note (Signed)
NAMEMARIEL, GAUDIN                ACCOUNT NO.:  [id number]   MEDICAL RECORD NO.:  [id number]          PATIENT TYPE:  AMB   LOCATION:  DSC                          FACILITY:  [facility]   PHYSICIAN:  [name]. [name], M.D. DATE OF BIRTH:  [birth year]   DATE OF PROCEDURE:  05/04/2006  DATE OF DISCHARGE:                               OPERATIVE REPORT   PREOPERATIVE DIAGNOSIS:  Status post arthrodesis left wrist utilizing an  AO titanium plate set and screws, with development of postoperative  swelling overlying long finger metacarpal, with recent x-rays revealing  instability of the distal screw in the third metacarpal and fracture of  the metaphysis groove in the third metacarpal, evidencing  carpometacarpal instability.   POSTOPERATIVE DIAGNOSIS:  Status post arthrodesis left wrist utilizing  an AO titanium plate set and screws, with development of postoperative  swelling overlying long finger metacarpal, with recent x-rays revealing  instability of the distal screw in the third metacarpal and fracture of  the metaphysis groove in the third metacarpal, evidencing  carpometacarpal instability.   OPERATIONS:  Removal of AO titanium plate, with debridement of titanium  debris from soft tissues of plate pseudocapsule, and removal of 7-1/2  screws securing the plate. The threaded shaft of the proximal metaphysis  screw in the long finger metacarpal was left behind, as it had fracture  due to repetitive strain, and in my judgment the risk of removal  outweighed the benefits of removal.   OPERATING SURGEON:  [name]. [name], M.D.   ASSISTANT:  [name]. [name], P.A.-C.   ANESTHESIA:  General by LMA.   SUPERVISING ANESTHESIOLOGIST:  Germaine Pomfret, M.D.   INDICATIONS:  Donaven Criswell is a 77 year old gentleman who experienced  Preiser's disease of the left scaphoid.  He went on to develop a painful  left wrist and initially underwent a scaphoid excision and ulnar four-  bone  fusion.  Despite fusion of his capitolunate joint, he developed a  secondary pain phenomenon and ultimately was advised to undergo wrist  arthrodesis.   On March 22, 2006, we performed an arthrodesis of his left wrist  utilizing an AO titanium plate set, with the initial recommendation of  use of titanium based on its flexible qualities.   Mr. Carrick was on to fuse his radiocarpal and midcarpal joints  satisfactorily.  However, he returned with pain overlying his long  finger metacarpal and was noted to have instability of his long finger  metacarpal at the carpometacarpal joint.  X-ray of his hand and wrist  revealed that his wrist was fused. However, his long finger metacarpal  head micromotion and loosening of his distal screw and fracture of his  metaphysis screw.  We recommended plate removal.   Experience since the time of his plate implantation has suggested that  titanium plate sets are problematic due to the brittleness of titanium.  We have subsequently ceased to utilize titanium and are in the process  of removing titanium plate and screw sets at this time when there are  clinical indications to do so.   Mr. Oberholzer was advised  to proceed with plate and screw removal of this  time, anticipating leaving the threaded portion of the fractured screw  behind in that removal of the screw would weaken the long finger  metacarpal and create a risk for pathologic fracture.   There does not appear to be any data at this time warranting the removal  of a threaded stable screw fragment.   PROCEDURE:  Gilford Lardizabal was brought to the operating room and placed in  the supine position upon the operating table.   Following an anesthesia consult by Dr. Jean Rosenthal, general anesthesia by  LMA was recommended.   Dr. Jean Rosenthal had reviewed Mr. Bagnall recent medical records in which he  had had a cardiac evaluation and treatment in the fall of 2007 both  outpatient and inpatient for a right  upper lobe pneumonia.   Under Dr. Edison Pace direct supervision, general anesthesia by LMA  technique was induced, followed by Betadine scrub and paint of the left  upper extremity. A pneumatic tourniquet was applied to the proximal left  brachium.   Following exsanguination of the left arm with an Esmarch bandage, the  arterial tourniquet was inflated to 250 mmHg.  Mr. Jacome had received 1  g of Ancef as an IV prophylactic antibiotic.   The procedure commenced with resection of the prior surgical scar.  Subcutaneous tissues were carefully divided, taking care to spare the  dorsal longitudinal veins.  The plate was identified by palpation  distally and, with the aid of a scalpel and periosteal elevator. the  pseudocapsule surrounding the plate was elevated, exposing the plate and  screws.  Distally, at the metacarpal insertion, the distal screw was  noted to be loose.  The more proximal metaphyseal screw had fractured.  The radial screws were quite firmly fixed in the radius, with excellent  bone response growing around the plate.   With great care, all of the screws remove the radius, carpus, and  metacarpal.  The threaded portion of the fracture screw in the long  finger metacarpal was left behind.  There was noted to be 3 degrees of  flexion/extension through the carpometacarpal joint of the long finger  which was probably the source of repetitive strain leading to fracture  of the screw and loosening of the most distal screw.   As is typically the case, there was some titanium residue staining the  soft tissues.  This was curetted and removed with a rongeur as much as  technically possible.   The wound was then irrigated with sterile saline, followed by  hemostasis.  The extensor retinaculum was repaired with mattress sutures  of 4-0 Vicryl followed by repair of the skin with subdermal sutures of 4-  0 Vicryl and intradermal 3-0 Prolene.  Mr. Jason Fila was placed in a compressive  dressing with a volar plaster  splint to mobilize the carpometacarpal joint.   The tourniquet released, with immediate capillary fill to all fingers  and the thumb.  There were no apparent complications.   He subsequently was transferred to the recovery room with stable vital  signs.      [name] [name], M.D.  Electronically Signed     RVS/MEDQ  D:  05/04/2006  T:  05/04/2006  Job:  [id number]

## 2010-10-27 ENCOUNTER — Other Ambulatory Visit: Payer: Self-pay | Admitting: Gastroenterology

## 2011-12-28 ENCOUNTER — Other Ambulatory Visit: Payer: Self-pay | Admitting: Cardiology

## 2011-12-28 NOTE — H&P (Signed)
MS/PRE-CATH WORK UP/ABN NUCLEAR TODAY.      HPI:     General:           Mr Eltzroth is a 78 year old male followed by Dr Anne Fu with hx of coronary artery disease status post bypass/ then PCI's and hyperlipidemia. Cardiolite was performed in October of 2011 and was overall low risk with no evidence of any significant ischemia identified. Overall low risk scan. Normal EF. After his bypass surgery in 1987 he has had 3 subsequent percutaneous interventions. EKG shows sinus rhythm with PVC, rate 67.          He was seen due to problems with increasing SOB over the last 2 months. For example, chasing a dog he felts excessively short-winded and occasionally has fleeting chest discomfort. When laying on right side feels dizzy and breath feels short. Very SOB with activity and will develop chest tightness when going to mailbox, feels like a slug coming back. He has dizzy episodes multiple times during the day. Does not necessarily occur when standing up. Marland Kitchen He states that during his original cardiac catheterization, everything looked well and then he went to emergency surgery because one of the "arteries ruptured"          Today during nuclear exercise stress test he developed ST segment depression in stage 1 associated with chest pain, and nuclear images with basal inferior ischemia. Dr Anne Fu was in to see pt and decided to proceed with out pt cardiac cath early next week. Pt is currently pain free. .         Patient denies palpitations, syncope, swelling + PND,.     ROS:      as noted in HPI, no GI complaint, no black or bloody BMs, no abdominal pain, as noted in HPI, no diarrhea, no N/V, no fever, chills, occasional chest congestion, appetite stable. no IVP nor seafood allergies no neurological changes, he had problems with colon mass which was removed last year and now has colonoscopies every 6 months, next due end of next week, he is aware not to proceed with colonoscopy until cardiac cath performed and cleared  by cardiologist. 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     Medical History: CAD, CABG - 1987, 3 stents post. - Dr Anne Fu, Hyperlipidemia, Orthostatic hypotension, Vertigo.      Surgical History: CABG 12/1985, colonoscopy 2005, B cataracts - Dr Dione Booze 06/2010, Left rotator cuff 2010, Left wrist surgery 2008, removal of colon mass in 3 sections was benign with plans for recurrent colonoscopies every 6 months to 1 yr 2012.      Hospitalization/Major Diagnostic Procedure: Not with in past year 09/2010.      Family History:  Father: deceased MI at 25 Mother: deceased  No early CAD Negative Gi Family History.     Social History:      General: History of smoking  cigarettes: Former smoker Quit 20 years ago.  no Smoking. no Alcohol. Caffeine: yes, 2+ servings daily. no Diet. Exercise: yes, 15-81min aerobics daily - likes to hit heavy bag. Occupation: retired, works as Office manager. Marital Status: married. Children: 3 kids, 7 grandkids.     Former Cabin crew.     Medications: Aspirin EC 81 MG Tablet Delayed Release 1 tablet Once a day, Centrum Silver Ultra Mens . Tablet 1 tablet once a day, Nitroglycerin 0.4 mg , Crestor 10 MG Tablet 1 tablet once a day, Medication List reviewed and reconciled with the patient  Allergies: Lipitor: Aches.      Objective:    Vitals: Wt 138.2, Wt change 1.2 lb, Ht 66, BMI 22.30, Pulse sitting 76, BP sitting 116/72.     Examination:     General Examination:         GENERAL APPEARANCE alert, oriented, NAD, pleasant, pale.  NECK: supple, no carotid bruits, no JVD.  LUNGS: clear to auscultation bilaterally, no wheezes, rhonchi, rales.  HEART: no murmurs, regular rate and rhythm.  ABDOMEN:  soft and not tender, + bowel sounds.  EXTREMITIES:  no edema.  PERIPHERAL PULSES: normal (2+) bilaterally.            Assessment:    Assessment:  1. Abnormal nuclear stress test - 794.39 (Primary), nuclear images with mild basal inferior defect but significant ST  depression on EKG during exericse, plan to proceed with cardiac cathereterization   2. Coronary atherosclerosis of unspecified type of vessel, native or graft - 414.00   3. Bradycardia - 427.89   4. Dizziness - 780.4     Plan:    1. Abnormal nuclear stress test        LAB: Basic Metabolic      GLUCOSE 138 70-99 - mg/dL H       BUN 15 0-98 - mg/dL        CREATININE 1.19 0.60-1.30 - mg/dl        eGFR (NON-AFRICAN AMERICAN) 86 >60 - calc        eGFR (AFRICAN AMERICAN) 105 >60 - calc        SODIUM 140 136-145 - mmol/L        POTASSIUM 3.9 3.5-5.5 - mmol/L        CHLORIDE 104 98-107 - mmol/L        C02 29 22-32 - mg/dL        ANION GAP 14.7 8.2-95.6 - mmol/L        CALCIUM 9.5 8.6-10.3 - mg/dL               Brenna Friesenhahn A 12/28/2011 03:21:42 PM > ok for cath        LAB: CBC with Diff     WBC 8.3 4.0-11.0 - K/ul         RBC 3.83 4.20-5.80 - M/uL L        HGB 11.6 13.0-17.0 - g/dL L        HCT 21.3 08.6-57.8 - % L       MCH 30.3 27.0-33.0 - pg        MPV 8.7 7.5-10.7 - fL        MCV 93.2 80.0-94.0 - fL        MCHC 32.5 32.0-36.0 - g/dL        RDW 46.9 62.9-52.8 - %        NRBC# 0.00 -        PLT 181 150-400 - K/uL         NEUT % 79.5 43.3-71.9 - % H       NRBC% 0.00 - %         LYMPH% 12.3 16.8-43.5 - % L       MONO % 7.6 4.6-12.4 - %        EOS % 0.2 0.0-7.8 - %        BASO % 0.4 0.0-1.0 - %        NEUT # 6.6 1.9-7.2 - K/uL         LYMPH# 1.00 1.10-2.70 -  K/uL L       MONO # 0.6 0.3-0.8 - K/uL        EOS # 0.0 0.0-0.6 - K/uL        BASO # 0.0 0.0-0.1 - K/uL               Neilan Rizzo A 12/28/2011 03:21:29 PM > for cath        LAB: PT and PTT (696295) Risks and benefits of cardiac catheterization have been reviewed including risk of stroke, heart attack, death, bleeding, renal impariment and arterial damage. There was ample oppurtuny to answer questions. Alternatives were discussed. Patient understands and wishes to proceed.       2. Coronary atherosclerosis of  unspecified type of vessel, native or graft  Continue Aspirin EC Tablet Delayed Release, 81 MG, 1 tablet, Orally, Once a day ;  Continue Nitroglycerin 0.4 mg ;  Continue Crestor Tablet, 10 MG, 1 tablet, Orally, once a day .    currently awaiting echocardiogram and holter monitor results (obtained due to dizziness and first degree HB and bradycardia). which are pending.          Immunizations:       Labs:      Procedure Codes: 28413 ECL BMP, 85025 ECL CBC PLATELET DIFF, 24401 BLOOD COLLECTION ROUTINE VENIPUNCTURE     Preventive:           Follow Up: MS pending cath (Reason: CAD, post cath)        Provider: Michaell Cowing. Emelda Fear, NP  Patient: Kyvon, Hu  DOB: Feb 04, 1934  Date: 12/28/2011

## 2011-12-29 ENCOUNTER — Other Ambulatory Visit: Payer: Self-pay | Admitting: Cardiology

## 2011-12-29 NOTE — H&P (Signed)
Office Visit     Patient: [name], [name] Provider: Cynthia A. Ferguson, NP  DOB: 10/23/1933   Age: 78 Y   Sex: Male Date: 12/28/2011  Phone: [phone]  Address: [address], [place], [place]  Pcp: KIMBERLEE D SHAW    --------------------------------------------------------------------------------  Subjective:    CC:      1. MS/PRE-CATH WORK UP/ABN NUCLEAR TODAY.      HPI:     General:           [name] is a 78-year-old male followed by [name] with hx of coronary artery disease status post bypass/ then PCI's and hyperlipidemia. Cardiolite was performed in October of 2011 and was overall low risk with no evidence of any significant ischemia identified. Overall low risk scan. Normal EF. After his bypass surgery in 1987 he has had 3 subsequent percutaneous interventions. EKG shows sinus rhythm with PVC, rate 67.          He was seen due to problems with increasing SOB over the last 2 months. For example, chasing a dog he felts excessively short-winded and occasionally has fleeting chest discomfort. When laying on right side feels dizzy and breath feels short. Very SOB with activity and will develop chest tightness when going to mailbox, feels like a slug coming back. He has dizzy episodes multiple times during the day. Does not necessarily occur when standing up. . He states that during his original cardiac catheterization, everything looked well and then he went to emergency surgery because one of the "arteries ruptured"          Today during nuclear exercise stress test he developed ST segment depression in stage 1 associated with chest pain, and nuclear images with basal inferior ischemia. [name] was in to see pt and decided to proceed with out pt cardiac cath early next week. Pt is currently pain free. .         Patient denies palpitations, syncope, swelling + PND,.     ROS:      as noted in HPI, no GI complaint, no black or bloody BMs, no abdominal pain, as noted  in HPI, no diarrhea, no N/V, no fever, chills, occasional chest congestion, appetite stable. no IVP nor seafood allergies no neurological changes, he had problems with colon mass which was removed last year and now has colonoscopies every 6 months, next due end of next week, he is aware not to proceed with colonoscopy until cardiac cath performed and cleared by cardiologist. 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     Medical History: CAD, CABG - 1987, 3 stents post. - [name], Hyperlipidemia, Orthostatic hypotension, Vertigo.      Surgical History: CABG 12/1985, colonoscopy 2005, B cataracts - [name] 06/2010, Left rotator cuff 2010, Left wrist surgery 2008, removal of colon mass in 3 sections was benign with plans for recurrent colonoscopies every 6 months to 1 yr 2012.      Hospitalization/Major Diagnostic Procedure: Not with in past year 09/2010.      Family History:  Father: deceased MI at 90 Mother: deceased  No early CAD Negative Gi Family History.     Social History:      General: History of smoking  cigarettes: Former smoker Quit 20 years ago.  no Smoking. no Alcohol. Caffeine: yes, 2+ servings daily. no Diet. Exercise: yes, 15-20min aerobics daily - likes to hit heavy bag. Occupation: retired, works as security. Marital Status: married. Children: 3   kids, 7 grandkids.     [employer].     Medications: Aspirin EC 81 MG Tablet Delayed Release 1 tablet Once a day, Centrum Silver Ultra Mens . Tablet 1 tablet once a day, Nitroglycerin 0.4 mg , Crestor 10 MG Tablet 1 tablet once a day, Medication List reviewed and reconciled with the patient     Allergies: Lipitor: Aches.      Objective:    Vitals: Wt 138.2, Wt change 1.2 lb, Ht 66, BMI 22.30, Pulse sitting 76, BP sitting 116/72.     Examination:     General Examination:         GENERAL APPEARANCE alert, oriented, NAD, pleasant, pale.  NECK: supple, no carotid bruits, no JVD.  LUNGS: clear to auscultation bilaterally,  no wheezes, rhonchi, rales.  HEART: no murmurs, regular rate and rhythm.  ABDOMEN:  soft and not tender, + bowel sounds.  EXTREMITIES:  no edema.  PERIPHERAL PULSES: normal (2+) bilaterally.            Assessment:    Assessment:  1. Abnormal nuclear stress test - 794.39 (Primary), nuclear images with mild basal inferior defect but significant ST depression on EKG during exericse, plan to proceed with cardiac cathereterization   2. Coronary atherosclerosis of unspecified type of vessel, native or graft - 414.00   3. Bradycardia - 427.89   4. Dizziness - 780.4     Plan:    1. Abnormal nuclear stress test        LAB: Basic Metabolic      GLUCOSE 138 70-99 - mg/dL H       BUN 15 6-26 - mg/dL        CREATININE 0.86 0.60-1.30 - mg/dl        eGFR (NON-AFRICAN AMERICAN) 86 >60 - calc        eGFR (AFRICAN AMERICAN) 105 >60 - calc        SODIUM 140 136-145 - mmol/L        POTASSIUM 3.9 3.5-5.5 - mmol/L        CHLORIDE 104 98-107 - mmol/L        C02 29 22-32 - mg/dL        ANION GAP 10.8 6.0-20.0 - mmol/L        CALCIUM 9.5 8.6-10.3 - mg/dL               FERGUSON,CYNTHIA A 12/28/2011 03:21:42 PM > ok for cath        LAB: CBC with Diff     WBC 8.3 4.0-11.0 - K/ul         RBC 3.83 4.20-5.80 - M/uL L        HGB 11.6 13.0-17.0 - g/dL L        HCT 35.7 39.0-52.0 - % L       MCH 30.3 27.0-33.0 - pg        MPV 8.7 7.5-10.7 - fL        MCV 93.2 80.0-94.0 - fL        MCHC 32.5 32.0-36.0 - g/dL        RDW 14.8 11.5-15.5 - %        NRBC# 0.00 -        PLT 181 150-400 - K/uL         NEUT % 79.5 43.3-71.9 - % H       NRBC% 0.00 - %         LYMPH% 12.3 16.8-43.5 - % L         MONO % 7.6 4.6-12.4 - %        EOS % 0.2 0.0-7.8 - %        BASO % 0.4 0.0-1.0 - %        NEUT # 6.6 1.9-7.2 - K/uL         LYMPH# 1.00 1.10-2.70 - K/uL L       MONO # 0.6 0.3-0.8 - K/uL        EOS # 0.0 0.0-0.6 - K/uL        BASO # 0.0 0.0-0.1 - K/uL               FERGUSON,CYNTHIA A 12/28/2011 03:21:29 PM > for cath         LAB: PT and PTT (020321)     aPTT 26 24-33 - SEC        INR 1.1 0.8-1.2 -        Prothrombin Time 11.2 9.1-12.0 - SEC               FERGUSON,CYNTHIA A 12/29/2011 08:34:06 AM > ok for cath   Risks and benefits of cardiac catheterization have been reviewed including risk of stroke, heart attack, death, bleeding, renal impariment and arterial damage. There was ample oppurtuny to answer questions. Alternatives were discussed. Patient understands and wishes to proceed.       2. Coronary atherosclerosis of unspecified type of vessel, native or graft  Continue Aspirin EC Tablet Delayed Release, 81 MG, 1 tablet, Orally, Once a day ;  Continue Nitroglycerin 0.4 mg ;  Continue Crestor Tablet, 10 MG, 1 tablet, Orally, once a day .    currently awaiting echocardiogram and holter monitor results (obtained due to dizziness and first degree HB and bradycardia). which are pending.          Procedures:       cc to EPIC.     Immunizations:       Labs:      Procedure Codes: 80048 ECL BMP, 85025 ECL CBC PLATELET DIFF, 36415 BLOOD COLLECTION ROUTINE VENIPUNCTURE     Preventive:           Follow Up: MS pending cath (Reason: CAD, post cath)        Provider: Cynthia A. Ferguson, NP  Patient: [name], [name]  DOB: 02/24/1934  Date: 12/28/2011   

## 2012-01-01 ENCOUNTER — Encounter (HOSPITAL_BASED_OUTPATIENT_CLINIC_OR_DEPARTMENT_OTHER): Payer: Self-pay

## 2012-01-01 ENCOUNTER — Encounter (HOSPITAL_BASED_OUTPATIENT_CLINIC_OR_DEPARTMENT_OTHER)
Admission: RE | Disposition: A | Discharge status: Home / Self Care | Payer: Self-pay | Source: Ambulatory Visit | Attending: Cardiology

## 2012-01-01 ENCOUNTER — Inpatient Hospital Stay (HOSPITAL_BASED_OUTPATIENT_CLINIC_OR_DEPARTMENT_OTHER)
Admission: RE | Admit: 2012-01-01 | Discharge: 2012-01-01 | Disposition: A | Discharge status: Home / Self Care | Payer: Medicare Other | Source: Ambulatory Visit | Attending: Cardiology | Admitting: Cardiology

## 2012-01-01 ENCOUNTER — Inpatient Hospital Stay (HOSPITAL_BASED_OUTPATIENT_CLINIC_OR_DEPARTMENT_OTHER): Admit: 2012-01-01 | Payer: Self-pay | Admitting: Cardiology

## 2012-01-01 ENCOUNTER — Other Ambulatory Visit: Payer: Self-pay | Admitting: Interventional Cardiology

## 2012-01-01 DIAGNOSIS — E785 Hyperlipidemia, unspecified: Secondary | ICD-10-CM | POA: Insufficient documentation

## 2012-01-01 DIAGNOSIS — R9439 Abnormal result of other cardiovascular function study: Secondary | ICD-10-CM | POA: Insufficient documentation

## 2012-01-01 DIAGNOSIS — R0609 Other forms of dyspnea: Secondary | ICD-10-CM | POA: Insufficient documentation

## 2012-01-01 DIAGNOSIS — Z951 Presence of aortocoronary bypass graft: Secondary | ICD-10-CM | POA: Insufficient documentation

## 2012-01-01 DIAGNOSIS — I2510 Atherosclerotic heart disease of native coronary artery without angina pectoris: Secondary | ICD-10-CM | POA: Diagnosis present

## 2012-01-01 DIAGNOSIS — I498 Other specified cardiac arrhythmias: Secondary | ICD-10-CM | POA: Insufficient documentation

## 2012-01-01 DIAGNOSIS — R42 Dizziness and giddiness: Secondary | ICD-10-CM | POA: Insufficient documentation

## 2012-01-01 DIAGNOSIS — R0989 Other specified symptoms and signs involving the circulatory and respiratory systems: Secondary | ICD-10-CM | POA: Insufficient documentation

## 2012-01-01 SURGERY — JV LEFT HEART CATHETERIZATION WITH CORONARY ANGIOGRAM
Anesthesia: Moderate Sedation

## 2012-01-01 MED ORDER — SODIUM CHLORIDE 0.9 % IV SOLN: 1.0000 mL/kg/h | INTRAVENOUS | Status: AC

## 2012-01-01 MED ORDER — SODIUM CHLORIDE 0.9 % IV SOLN: INTRAVENOUS | Status: DC

## 2012-01-01 MED ORDER — DIAZEPAM 5 MG PO TABS
5.0000 mg | ORAL_TABLET | ORAL | Status: AC
  Administered 2012-01-01: 5 mg via ORAL

## 2012-01-01 MED ORDER — SODIUM CHLORIDE 0.9 % IV SOLN: 250.0000 mL | INTRAVENOUS | Status: DC | PRN

## 2012-01-01 MED ORDER — ONDANSETRON HCL 4 MG/2ML IJ SOLN: 4.0000 mg | Freq: Four times a day (QID) | INTRAMUSCULAR | Status: DC | PRN

## 2012-01-01 MED ORDER — ASPIRIN 81 MG PO CHEW
324.0000 mg | CHEWABLE_TABLET | ORAL | Status: AC
  Administered 2012-01-01: 324 mg via ORAL

## 2012-01-01 MED ORDER — ACETAMINOPHEN 325 MG PO TABS: 650.0000 mg | ORAL_TABLET | ORAL | Status: DC | PRN

## 2012-01-01 MED ORDER — SODIUM CHLORIDE 0.9 % IJ SOLN: 3.0000 mL | INTRAMUSCULAR | Status: DC | PRN

## 2012-01-01 MED ORDER — SODIUM CHLORIDE 0.9 % IJ SOLN: 3.0000 mL | Freq: Two times a day (BID) | INTRAMUSCULAR | Status: DC

## 2012-01-01 NOTE — H&P (View-Only) (Signed)
Office Visit     Patient: [name], [place] Provider: Michaell Cowing. Emelda Fear, NP  DOB: [birth year]   Age: 78 Y   Sex: Male Date: 12/28/2011  Phone: [phone]  Address: [address], [place], [id number]  Pcp: Andria Meuse SHAW    --------------------------------------------------------------------------------  Subjective:    CC:      1. MS/PRE-CATH WORK UP/ABN NUCLEAR TODAY.      HPI:     General:           Mr Cork is a 78 year old male followed by Dr Anne Fu with hx of coronary artery disease status post bypass/ then PCI's and hyperlipidemia. Cardiolite was performed in October of 2011 and was overall low risk with no evidence of any significant ischemia identified. Overall low risk scan. Normal EF. After his bypass surgery in 1987 he has had 3 subsequent percutaneous interventions. EKG shows sinus rhythm with PVC, rate 67.          He was seen due to problems with increasing SOB over the last 2 months. For example, chasing a dog he felts excessively short-winded and occasionally has fleeting chest discomfort. When laying on right side feels dizzy and breath feels short. Very SOB with activity and will develop chest tightness when going to mailbox, feels like a slug coming back. He has dizzy episodes multiple times during the day. Does not necessarily occur when standing up. Marland Kitchen He states that during his original cardiac catheterization, everything looked well and then he went to emergency surgery because one of the "arteries ruptured"          Today during nuclear exercise stress test he developed ST segment depression in stage 1 associated with chest pain, and nuclear images with basal inferior ischemia. Dr Anne Fu was in to see pt and decided to proceed with out pt cardiac cath early next week. Pt is currently pain free. .         Patient denies palpitations, syncope, swelling + PND,.     ROS:      as noted in HPI, no GI complaint, no black or bloody BMs, no abdominal pain, as noted  in HPI, no diarrhea, no N/V, no fever, chills, occasional chest congestion, appetite stable. no IVP nor seafood allergies no neurological changes, he had problems with colon mass which was removed last year and now has colonoscopies every 6 months, next due end of next week, he is aware not to proceed with colonoscopy until cardiac cath performed and cleared by cardiologist. 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     Medical History: CAD, CABG - 1987, 3 stents post. - Dr Anne Fu, Hyperlipidemia, Orthostatic hypotension, Vertigo.      Surgical History: CABG 12/1985, colonoscopy 2005, B cataracts - Dr Dione Booze 06/2010, Left rotator cuff 2010, Left wrist surgery 2008, removal of colon mass in 3 sections was benign with plans for recurrent colonoscopies every 6 months to 1 yr 2012.      Hospitalization/Major Diagnostic Procedure: Not with in past year 09/2010.      Family History:  Father: deceased MI at 16 Mother: deceased  No early CAD Negative Gi Family History.     Social History:      General: History of smoking  cigarettes: Former smoker Quit 20 years ago.  no Smoking. no Alcohol. Caffeine: yes, 2+ servings daily. no Diet. Exercise: yes, 15-52min aerobics daily - likes to hit heavy bag. Occupation: retired, works as Office manager. Marital Status: married. Children: 3  kids, 7 grandkids.     Former Cabin crew.     Medications: Aspirin EC 81 MG Tablet Delayed Release 1 tablet Once a day, Centrum Silver Ultra Mens . Tablet 1 tablet once a day, Nitroglycerin 0.4 mg , Crestor 10 MG Tablet 1 tablet once a day, Medication List reviewed and reconciled with the patient     Allergies: Lipitor: Aches.      Objective:    Vitals: Wt 138.2, Wt change 1.2 lb, Ht 66, BMI 22.30, Pulse sitting 76, BP sitting 116/72.     Examination:     General Examination:         GENERAL APPEARANCE alert, oriented, NAD, pleasant, pale.  NECK: supple, no carotid bruits, no JVD.  LUNGS: clear to auscultation bilaterally,  no wheezes, rhonchi, rales.  HEART: no murmurs, regular rate and rhythm.  ABDOMEN:  soft and not tender, + bowel sounds.  EXTREMITIES:  no edema.  PERIPHERAL PULSES: normal (2+) bilaterally.            Assessment:    Assessment:  1. Abnormal nuclear stress test - 794.39 (Primary), nuclear images with mild basal inferior defect but significant ST depression on EKG during exericse, plan to proceed with cardiac cathereterization   2. Coronary atherosclerosis of unspecified type of vessel, native or graft - 414.00   3. Bradycardia - 427.89   4. Dizziness - 780.4     Plan:    1. Abnormal nuclear stress test        LAB: Basic Metabolic      GLUCOSE 138 70-99 - mg/dL H       BUN 15 4-78 - mg/dL        CREATININE 2.95 0.60-1.30 - mg/dl        eGFR (NON-AFRICAN AMERICAN) 86 >60 - calc        eGFR (AFRICAN AMERICAN) 105 >60 - calc        SODIUM 140 136-145 - mmol/L        POTASSIUM 3.9 3.5-5.5 - mmol/L        CHLORIDE 104 98-107 - mmol/L        C02 29 22-32 - mg/dL        ANION GAP 62.1 3.0-86.5 - mmol/L        CALCIUM 9.5 8.6-10.3 - mg/dL               FERGUSON,CYNTHIA A 12/28/2011 03:21:42 PM > ok for cath        LAB: CBC with Diff     WBC 8.3 4.0-11.0 - K/ul         RBC 3.83 4.20-5.80 - M/uL L        HGB 11.6 13.0-17.0 - g/dL L        HCT 78.4 69.6-29.5 - % L       MCH 30.3 27.0-33.0 - pg        MPV 8.7 7.5-10.7 - fL        MCV 93.2 80.0-94.0 - fL        MCHC 32.5 32.0-36.0 - g/dL        RDW 28.4 13.2-44.0 - %        NRBC# 0.00 -        PLT 181 150-400 - K/uL         NEUT % 79.5 43.3-71.9 - % H       NRBC% 0.00 - %         LYMPH% 12.3 16.8-43.5 - % L  MONO % 7.6 4.6-12.4 - %        EOS % 0.2 0.0-7.8 - %        BASO % 0.4 0.0-1.0 - %        NEUT # 6.6 1.9-7.2 - K/uL         LYMPH# 1.00 1.10-2.70 - K/uL L       MONO # 0.6 0.3-0.8 - K/uL        EOS # 0.0 0.0-0.6 - K/uL        BASO # 0.0 0.0-0.1 - K/uL               FERGUSON,CYNTHIA A 12/28/2011 03:21:29 PM > for cath         LAB: PT and PTT ([id number])     aPTT 26 24-33 - SEC        INR 1.1 0.8-1.2 -        Prothrombin Time 11.2 9.1-12.0 - SEC               FERGUSON,CYNTHIA A 12/29/2011 08:34:06 AM > ok for cath   Risks and benefits of cardiac catheterization have been reviewed including risk of stroke, heart attack, death, bleeding, renal impariment and arterial damage. There was ample oppurtuny to answer questions. Alternatives were discussed. Patient understands and wishes to proceed.       2. Coronary atherosclerosis of unspecified type of vessel, native or graft  Continue Aspirin EC Tablet Delayed Release, 81 MG, 1 tablet, Orally, Once a day ;  Continue Nitroglycerin 0.4 mg ;  Continue Crestor Tablet, 10 MG, 1 tablet, Orally, once a day .    currently awaiting echocardiogram and holter monitor results (obtained due to dizziness and first degree HB and bradycardia). which are pending.          Procedures:       cc to EPIC.     Immunizations:       Labs:      Procedure Codes: 13086 ECL BMP, 85025 ECL CBC PLATELET DIFF, 57846 BLOOD COLLECTION ROUTINE VENIPUNCTURE     Preventive:           Follow Up: MS pending cath (Reason: CAD, post cath)        Provider: Michaell Cowing. Emelda Fear, NP  Patient: Benjy, Kana  DOB: 1933/05/16  Date: 12/28/2011

## 2012-01-01 NOTE — Interval H&P Note (Signed)
History and Physical Interval Note:  01/01/2012 9:58 AM  [name]  has presented today for surgery, with the diagnosis of cp  The various methods of treatment have been discussed with the patient and family. After consideration of risks, benefits and other options for treatment, the patient has consented to  Procedure(s) (LRB): JV LEFT HEART CATHETERIZATION WITH CORONARY ANGIOGRAM (N/A) as a surgical intervention .  The patient's history has been reviewed, patient examined, no change in status, stable for surgery.  I have reviewed the patient's chart and labs.  Questions were answered to the patient's satisfaction.     SKAINS, MARK

## 2012-01-01 NOTE — OR Nursing (Signed)
Discharge instructions reviewed and signed, pt stated understanding, ambulated in hall without difficulty, site level 0, transported to wife's car via wheelchair 

## 2012-01-01 NOTE — OR Nursing (Signed)
Meal served 

## 2012-01-01 NOTE — CV Procedure (Signed)
CARDIAC CATHETERIZATION  PROCEDURE:  Left heart catheterization with selective coronary angiography, left ventriculogram.  INDICATIONS:  53 her old male with coronary artery disease status post single-vessel bypass in 1987, SVG to RCA which remains patent with subsequent percutaneous intervention x2 to the LAD mid to distal here with abnormal nuclear stress test, increasing dyspnea .  The risks, benefits, and details of the procedure were explained to the patient.  The patient verbalized understanding and wanted to proceed.  Informed written consent was obtained.  PROCEDURE TECHNIQUE:  After Xylocaine anesthesia and visualization of the femoral head via fluoroscopy, a 101F sheath was placed in the right femoral artery with a single anterior needle wall stick. Excellent blood flow was demonstrated however the wire would not adequately pass into the lumen of the femoral artery. Fluoroscopy was obtained and demonstrated a heavily calcified segment. A Versicor wire was then used but once again would not successfully pass. The needle was then removed and pressure held. A second attempt was made slightly inferior to the previous needle stick and was successful.   Left coronary angiography was done using a Judkins L4 catheter.  Right coronary angiography was done using a Judkins R4 catheter.  SVG graft was selected with right coronary artery bypass graft catheter. Left ventriculography was done using a pigtail catheter.    CONTRAST:  Total of 100 ml.  FLOUROSCOPY TIME: 5.8 minutes.   COMPLICATIONS:  None.    HEMODYNAMICS:  Aortic pressure was 126/62/90 mmHg; LV systolic pressure was 126 mmHg; LVEDP 16 mmHg.  There was no gradient between the left ventricle and aorta.    ANGIOGRAPHIC DATA:    Left main: Heavily calcified distal left main/proximal LAD. The left main tapers in the LAO caudal view and constitutes a 40-50% stenosis.  Left anterior descending (LAD): The LAD is heavily calcified throughout the  vessel especially in its proximal/mid segment. The 2 previously placed stents are patent however just proximal to the proximal stent there is a 99% stenosis. The LAD is rather small in caliber.  Circumflex artery (CIRC): There are 4 small to moderate size obtuse marginal branches with minor luminal irregularities.  Right coronary artery (RCA): Proximal occlusion, no right to right collaterals.  Bypass graft: SVG to RCA remains widely patent. The RCA backfills to the midsegment. At the anastomosis site of the PDA there is mild narrowing but overall the graft supplies a large territory of the inferior wall.  LEFT VENTRICULOGRAM:  Left ventricular angiogram was done in the 30 RAO projection and revealed normal left ventricular wall motion and systolic function with an estimated ejection fraction of 60 %. The aortic arch is diffusely calcified. The descending aorta is also calcified but no obvious aneurysmal segments are noted. The common femoral artery is calcified.  IMPRESSIONS:  1. 40-50% distal left main, heavily calcified LAD with 99% stenosis just proximal to the previously placed patent stents. RCA is 100% occluded proximally. SVG graft is widely patent to the RCA territory.  2. Normal left ventricular systolic function.  LVEDP 16 mmHg.  Ejection fraction 60%.  RECOMMENDATION:  I will discuss with interventional cardiology, given the diffuse calcified nature of the LAD a planned procedure i.e. possibility of rotablation must be considered. I discussed with patient and family.

## 2012-01-01 NOTE — OR Nursing (Signed)
Dr Skains at bedside to discuss results and treatment plan with pt and family 

## 2012-01-01 NOTE — OR Nursing (Signed)
Tegaderm dressing applied, site level 0, bedrest begins at 1055 

## 2012-01-04 ENCOUNTER — Encounter (HOSPITAL_COMMUNITY)
Admission: RE | Disposition: A | Discharge status: Home / Self Care | Payer: Self-pay | Source: Ambulatory Visit | Attending: Interventional Cardiology

## 2012-01-04 ENCOUNTER — Encounter (HOSPITAL_COMMUNITY): Payer: Self-pay | Admitting: General Practice

## 2012-01-04 ENCOUNTER — Ambulatory Visit (HOSPITAL_COMMUNITY)
Admission: RE | Admit: 2012-01-04 | Discharge: 2012-01-05 | Disposition: A | Discharge status: Home / Self Care | Payer: Medicare Other | Source: Ambulatory Visit | Attending: Interventional Cardiology | Admitting: Interventional Cardiology

## 2012-01-04 DIAGNOSIS — R0609 Other forms of dyspnea: Secondary | ICD-10-CM

## 2012-01-04 DIAGNOSIS — E785 Hyperlipidemia, unspecified: Secondary | ICD-10-CM | POA: Insufficient documentation

## 2012-01-04 DIAGNOSIS — R0600 Dyspnea, unspecified: Secondary | ICD-10-CM

## 2012-01-04 DIAGNOSIS — R200 Anesthesia of skin: Secondary | ICD-10-CM

## 2012-01-04 DIAGNOSIS — I2510 Atherosclerotic heart disease of native coronary artery without angina pectoris: Secondary | ICD-10-CM | POA: Insufficient documentation

## 2012-01-04 DIAGNOSIS — I209 Angina pectoris, unspecified: Secondary | ICD-10-CM | POA: Insufficient documentation

## 2012-01-04 DIAGNOSIS — Z9861 Coronary angioplasty status: Secondary | ICD-10-CM | POA: Insufficient documentation

## 2012-01-04 DIAGNOSIS — Z951 Presence of aortocoronary bypass graft: Secondary | ICD-10-CM | POA: Insufficient documentation

## 2012-01-04 DIAGNOSIS — Z955 Presence of coronary angioplasty implant and graft: Secondary | ICD-10-CM

## 2012-01-04 HISTORY — DX: Other forms of dyspnea: R06.09

## 2012-01-04 HISTORY — DX: Dyspnea, unspecified: R06.00

## 2012-01-04 HISTORY — DX: Acute myocardial infarction, unspecified: I21.9

## 2012-01-04 HISTORY — PX: CORONARY ANGIOPLASTY WITH STENT PLACEMENT: SHX49

## 2012-01-04 HISTORY — PX: PERCUTANEOUS CORONARY STENT INTERVENTION (PCI-S): SHX5485

## 2012-01-04 HISTORY — DX: Pure hypercholesterolemia, unspecified: E78.00

## 2012-01-04 HISTORY — DX: Atherosclerotic heart disease of native coronary artery without angina pectoris: I25.10

## 2012-01-04 HISTORY — DX: Anesthesia of skin: R20.0

## 2012-01-04 HISTORY — DX: Personal history of other medical treatment: Z92.89

## 2012-01-04 HISTORY — DX: Paresthesia of skin: R20.2

## 2012-01-04 HISTORY — DX: Adverse effect of unspecified anesthetic, initial encounter: T41.45XA

## 2012-01-04 HISTORY — DX: Angina pectoris, unspecified: I20.9

## 2012-01-04 LAB — CBC WITH DIFFERENTIAL/PLATELET
Basophils Absolute: 0.1 10*3/uL (ref 0.0–0.1)
Basophils Relative: 1 % (ref 0–1)
Eosinophils Absolute: 0.3 10*3/uL (ref 0.0–0.7)
Hemoglobin: 11.8 g/dL — ABNORMAL LOW (ref 13.0–17.0)
MCHC: 33.2 g/dL (ref 30.0–36.0)
Neutro Abs: 4.2 10*3/uL (ref 1.7–7.7)
Neutrophils Relative %: 56 % (ref 43–77)
Platelets: 184 10*3/uL (ref 150–400)
RDW: 14.5 % (ref 11.5–15.5)

## 2012-01-04 LAB — BASIC METABOLIC PANEL
Chloride: 104 mEq/L (ref 96–112)
GFR calc Af Amer: >90 mL/min (ref >90)
GFR calc non Af Amer: 78 mL/min — ABNORMAL LOW (ref >90)
Potassium: 4.2 mEq/L (ref 3.5–5.1)
Sodium: 139 mEq/L (ref 135–145)

## 2012-01-04 LAB — APTT: aPTT: 30 seconds (ref 24–37)

## 2012-01-04 LAB — PROTIME-INR: Prothrombin Time: 13.9 seconds (ref 11.6–15.2)

## 2012-01-04 SURGERY — PERCUTANEOUS CORONARY STENT INTERVENTION (PCI-S)
Anesthesia: LOCAL

## 2012-01-04 MED ORDER — DIAZEPAM 2 MG PO TABS: 2.0000 mg | ORAL_TABLET | ORAL | Status: DC | PRN

## 2012-01-04 MED ORDER — ACETAMINOPHEN 325 MG PO TABS: 650.0000 mg | ORAL_TABLET | ORAL | Status: DC | PRN

## 2012-01-04 MED ORDER — CALCIUM CARBONATE ANTACID 500 MG PO CHEW
1.0000 | CHEWABLE_TABLET | Freq: Every day | ORAL | Status: DC
  Administered 2012-01-04: 200 mg via ORAL
  Filled 2012-01-04 (×2): qty 1

## 2012-01-04 MED ORDER — OXYCODONE-ACETAMINOPHEN 5-325 MG PO TABS: 1.0000 | ORAL_TABLET | ORAL | Status: DC | PRN

## 2012-01-04 MED ORDER — HEPARIN (PORCINE) IN NACL 2-0.9 UNIT/ML-% IJ SOLN
INTRAMUSCULAR | Status: AC
  Filled 2012-01-04: qty 2000

## 2012-01-04 MED ORDER — ADENOSINE 12 MG/4ML IV SOLN
12.0000 mL | INTRAVENOUS | Status: AC
  Administered 2012-01-04: 36 mg via INTRAVENOUS
  Filled 2012-01-04: qty 12

## 2012-01-04 MED ORDER — ASPIRIN EC 325 MG PO TBEC
325.0000 mg | DELAYED_RELEASE_TABLET | Freq: Every day | ORAL | Status: DC
  Administered 2012-01-05: 325 mg via ORAL
  Filled 2012-01-04: qty 1

## 2012-01-04 MED ORDER — CLOPIDOGREL BISULFATE 75 MG PO TABS
75.0000 mg | ORAL_TABLET | Freq: Every day | ORAL | Status: DC
  Administered 2012-01-05: 09:00:00 75 mg via ORAL
  Filled 2012-01-04: qty 1

## 2012-01-04 MED ORDER — SODIUM CHLORIDE 0.9 % IV SOLN: INTRAVENOUS | Status: AC

## 2012-01-04 MED ORDER — HEPARIN (PORCINE) IN NACL 2-0.9 UNIT/ML-% IJ SOLN
INTRAMUSCULAR | Status: AC
  Filled 2012-01-04: qty 1000

## 2012-01-04 MED ORDER — LIDOCAINE HCL (PF) 1 % IJ SOLN
INTRAMUSCULAR | Status: AC
  Filled 2012-01-04: qty 30

## 2012-01-04 MED ORDER — CLOPIDOGREL BISULFATE 300 MG PO TABS
600.0000 mg | ORAL_TABLET | Freq: Once | ORAL | Status: AC
  Administered 2012-01-04: 600 mg via ORAL
  Filled 2012-01-04: qty 2

## 2012-01-04 MED ORDER — DIAZEPAM 5 MG PO TABS
5.0000 mg | ORAL_TABLET | ORAL | Status: AC
  Administered 2012-01-04: 5 mg via ORAL
  Filled 2012-01-04: qty 1

## 2012-01-04 MED ORDER — ASPIRIN 81 MG PO CHEW
CHEWABLE_TABLET | ORAL | Status: AC
  Filled 2012-01-04: qty 4

## 2012-01-04 MED ORDER — NITROGLYCERIN 0.2 MG/ML ON CALL CATH LAB
INTRAVENOUS | Status: AC
  Filled 2012-01-04: qty 1

## 2012-01-04 MED ORDER — DIAZEPAM 5 MG PO TABS
ORAL_TABLET | ORAL | Status: AC
  Filled 2012-01-04: qty 1

## 2012-01-04 MED ORDER — ONDANSETRON HCL 4 MG/2ML IJ SOLN: 4.0000 mg | Freq: Four times a day (QID) | INTRAMUSCULAR | Status: DC | PRN

## 2012-01-04 MED ORDER — BIVALIRUDIN 250 MG IV SOLR
INTRAVENOUS | Status: AC
  Filled 2012-01-04: qty 250

## 2012-01-04 MED ORDER — SODIUM CHLORIDE 0.9 % IV SOLN: 250.0000 mL | INTRAVENOUS | Status: DC | PRN

## 2012-01-04 MED ORDER — NITROGLYCERIN 0.4 MG SL SUBL: 0.4000 mg | SUBLINGUAL_TABLET | SUBLINGUAL | Status: DC | PRN

## 2012-01-04 MED ORDER — FENTANYL CITRATE 0.05 MG/ML IJ SOLN
INTRAMUSCULAR | Status: AC
  Filled 2012-01-04: qty 2

## 2012-01-04 MED ORDER — ATORVASTATIN CALCIUM 20 MG PO TABS
20.0000 mg | ORAL_TABLET | Freq: Every day | ORAL | Status: DC
  Filled 2012-01-04 (×2): qty 1

## 2012-01-04 MED ORDER — SODIUM CHLORIDE 0.9 % IJ SOLN
3.0000 mL | Freq: Two times a day (BID) | INTRAMUSCULAR | Status: DC
Start: 2012-01-04 — End: 2012-01-04

## 2012-01-04 MED ORDER — MIDAZOLAM HCL 2 MG/2ML IJ SOLN
INTRAMUSCULAR | Status: AC
  Filled 2012-01-04: qty 2

## 2012-01-04 MED ORDER — ASPIRIN 81 MG PO CHEW
324.0000 mg | CHEWABLE_TABLET | ORAL | Status: AC
  Administered 2012-01-04: 324 mg via ORAL
  Filled 2012-01-04: qty 4

## 2012-01-04 MED ORDER — ADULT MULTIVITAMIN W/MINERALS CH
1.0000 | ORAL_TABLET | Freq: Every day | ORAL | Status: DC
Start: 2012-01-04 — End: 2012-01-05
  Administered 2012-01-04 – 2012-01-05 (×2): 1 via ORAL
  Filled 2012-01-04 (×2): qty 1

## 2012-01-04 MED ORDER — SODIUM CHLORIDE 0.9 % IJ SOLN: 3.0000 mL | INTRAMUSCULAR | Status: DC | PRN

## 2012-01-04 MED ORDER — SODIUM CHLORIDE 0.9 % IV SOLN
INTRAVENOUS | Status: DC
  Administered 2012-01-04: 08:00:00 via INTRAVENOUS

## 2012-01-04 NOTE — H&P (Signed)
The patient has an early positive ETT with ST depression, dyspnea, and angina. The nuclear study read by Dr. Anne Fu diagnoses inferior ischemia but in review of prior nuclear studies, there has always been reduced inferior wall uptake inferiorly. He has > 95% mid LAD restenosis and this is the likely cause for symptoms and early positive ETT. He is not on med therapy and has significant med intolerances. We discussed the planned procedure including PCI with DES and prolonged dual antiplatelet therapy. Also discussed the risk of stroke, MI, death, emergency PCI, limb ischemia, bleeding among others which was accepted by the patient.  May re look at the RCA graft and consider FFR.

## 2012-01-04 NOTE — Plan of Care (Signed)
Problem: Consults Goal: PCI Patient Education (See Patient Education module for education specifics.) Outcome: Completed/Met Date Met:  01/04/12 Reviewed information on stent with patient and wife. Verbalized understanding. Stated they saw the video and Dr.  Katrinka Blazing explained every thing to them very well. Denies questions.  Problem: Phase II Progression Outcomes Goal: Pain controlled Outcome: Completed/Met Date Met:  01/04/12 Patient remains pain free Goal: OOB to chair per PCI oders Outcome: Completed/Met Date Met:  01/04/12 Bedrest over at 1700 patient sitting up for dinner, tolerated well. Goal: Vascular site scale level 0 - I Vascular Site Scale Level 0: No bruising/bleeding/hematoma Level I (Mild): Bruising/Ecchymosis, minimal bleeding/ooozing, palpable hematoma < 3 cm Level II (Moderate): Bleeding not affecting hemodynamic parameters, pseudoaneurysm, palpable hematoma > 3 cm Outcome: Completed/Met Date Met:  01/04/12 Right groin site level 0 with no noted bleeding, swelling or hematoma. Gauze dressing remains dry and intact Goal: No post PCI angina Outcome: Completed/Met Date Met:  01/04/12 Pain free Goal: Distal pulses equal baseline assessment Outcome: Completed/Met Date Met:  01/04/12 Bilateral pedal pulses palpable +2 Goal: Discharge plan established Outcome: Completed/Met Date Met:  01/04/12 Plan to discharge tomorrow. Goal: Tolerating diet Outcome: Completed/Met Date Met:  01/04/12 Tolerating meals today with no problems

## 2012-01-04 NOTE — CV Procedure (Signed)
   PERCUTANEOUS CORONARY INTERVENTION   [name] is a 78 y.o. male  INDICATION:  Class III angina with early positive exercise treadmill test, and high-grade stenosis (ISR) mid LAD bare-metal stents.   PROCEDURE: 1. Saphenous vein graft angiography with FFR determination at distal anastomosis stenosis.  2. Drug-eluting stent implantation mid LAD.   CONSENT: The risks, benefits, and details of the procedure were explained to the patient. Risks including death, MI, stroke, bleeding, limb ischemia, renal failure and allergy were described and accepted by the patient.  Informed written consent was obtained prior to proceeding.  PROCEDURE TECHNIQUE:  After Xylocaine anesthesia a 6 French sheath was placed in the right  femoral artery with a single anterior needle wall stick.   Coronary guiding shots were made using a 6 French 4 cm XP LAD guide catheter. Antithrombotic therapy, bivalirudin bolus and infusion, was begun and determined to be therapeutic by ACT. Antiplatelet therapy, Plavix 600 mg, was loaded one hour prior to coming to the cath lab.  A Prowater wire was used to cross the stenosis in the mid LAD. We initially attempted to cross primarily with a 6 mm long by 2.5 mm diameter cutting balloon but was unsuccessful. We therefore predilated with a 2.0 x 12 mm long balloon to 6 atmospheres. We then able to come back with the cutting balloon and performed 2 inflations to 6 atmospheres each for 90 seconds . Complete occlusion of the artery did not reproduce symptoms. We then carefully positioned and deployed an 8 mm long by 2.5 mm Xience Xpedition drug-eluting stent at 10 atmospheres. We post dilated to 2.75 mm at 12 atmospheres using a 6 mm long Keyes balloon. 2 inflations were performed. TIMI grade 3 flow was noted. The angiographic appearance was quite acceptable.  We then turned our attention to the right coronary graft where there is an eccentric stenosis in the PDA/graft anastomosis.  Because of a question of inferior ischemia by nuclear testing, FFR was performed. After zeroing the equipment we passed with some difficulty a wave wire into the PDA and FFR was determined 120 seconds after the initiation of IV adenosine. FFR was 0.9.  CONTRAST:  Total of 150 cc.  COMPLICATIONS:  None.    ANGIOGRAPHIC RESULTS:   1. Eccentric 50-70% stenosis in the antegrade limb of the PDA anastomosis demonstrated to be hemodynamically insignificant by FFR of 0.9.  2. Successful DES implantation in the mid LAD for restenosis with reduction and 95% stenosis to 0% with TIMI grade 3 flow.   IMPRESSIONS:  1. Successful DES implantation in the mid LAD for in-stent restenosis reducing a greater than 95% stenosis to 0% with TIMI grade 3 flow.  2. No evidence of inducible ischemia in the right coronary territory based upon FFR of 0.9   RECOMMENDATION:  Aspirin and Plavix for 12 months.  Anti-ischemic medical therapy per Dr. Anne Fu.Marland Kitchen    Lesleigh Noe, MD 01/04/2012 10:46 AM

## 2012-01-05 DIAGNOSIS — I209 Angina pectoris, unspecified: Secondary | ICD-10-CM | POA: Diagnosis present

## 2012-01-05 LAB — BASIC METABOLIC PANEL
BUN: 16 mg/dL (ref 6–23)
CO2: 26 mEq/L (ref 19–32)
Calcium: 9.3 mg/dL (ref 8.4–10.5)
Creatinine, Ser: 0.91 mg/dL (ref 0.50–1.35)
Glucose, Bld: 95 mg/dL (ref 70–99)

## 2012-01-05 LAB — CBC
Hemoglobin: 11.1 g/dL — ABNORMAL LOW (ref 13.0–17.0)
MCH: 30.1 pg (ref 26.0–34.0)
MCV: 90.5 fL (ref 78.0–100.0)
RBC: 3.69 MIL/uL — ABNORMAL LOW (ref 4.22–5.81)

## 2012-01-05 MED ORDER — ASPIRIN 325 MG PO TBEC: 325.0000 mg | DELAYED_RELEASE_TABLET | Freq: Every day | ORAL | Status: DC

## 2012-01-05 MED ORDER — CLOPIDOGREL BISULFATE 75 MG PO TABS: 75.0000 mg | ORAL_TABLET | Freq: Every day | ORAL | Status: DC

## 2012-01-05 MED FILL — Dextrose Inj 5%: INTRAVENOUS | Qty: 50 | Status: AC

## 2012-01-05 NOTE — Progress Notes (Signed)
CARDIAC REHAB PHASE I   PRE:  Rate/Rhythm: 58SR  BP:  Supine: 114/55  Sitting:   Standing:    SaO2:   MODE:  Ambulation: 600 ft   POST:  Rate/Rhythem: 79SR  BP:  Supine:   Sitting: 105/57  Standing:    SaO2:  1610-9604 Pt walked 600 ft on RA with asst x 1 with steady gait. Tolerated well. Denied CP. Education completed with pt and wife. Permission given to refer to [facility] Phase 2. To bed after walk.  Duanne Limerick

## 2012-01-05 NOTE — Plan of Care (Signed)
Problem: Phase III Progression Outcomes Goal: No angina with increased activity Outcome: Completed/Met Date Met:  01/05/12 Patient free from pain, tolerating activity well. Goal: Tolerating diet Outcome: Completed/Met Date Met:  01/05/12 Appetite good tolerated dinner last night and breakfast this morning with no problems Goal: Discharge plan remains appropriate-arrangements made Outcome: Completed/Met Date Met:  01/05/12 Discharge home today as planned Goal: Vital signs stable Outcome: Completed/Met Date Met:  01/05/12 VSS Goal: Vascular site scale level 0 - I Vascular Site Scale Level 0: No bruising/bleeding/hematoma Level I (Mild): Bruising/Ecchymosis, minimal bleeding/ooozing, palpable hematoma < 3 cm Level II (Moderate): Bleeding not affecting hemodynamic parameters, pseudoaneurysm, palpable hematoma > 3 cm  Outcome: Completed/Met Date Met:  01/05/12 Vascular site right groin level 0 with bandaid dry and intact Goal: Cardiac Rehab if ordered Outcome: Completed/Met Date Met:  01/05/12 Cardiac into see patient this morning  Problem: Discharge Progression Outcomes Goal: Pain controlled with appropriate interventions Outcome: Completed/Met Date Met:  01/05/12 No pain today at rest or with activity

## 2012-01-05 NOTE — Discharge Summary (Addendum)
Patient ID: [name] MRN: [id number] DOB/AGE: [id number] y.o.  Admit date: 01/04/2012 Discharge date: 01/05/2012   Patient Active Problem List  Diagnosis  . Other nonspecific abnormal cardiovascular system function study  . Coronary atherosclerosis of native coronary artery  . Other and unspecified angina pectoris     Primary Discharge Diagnosis : Class III angina pectoris with early positive exercise treadmill test due to restenosis of LAD stents.  Secondary Discharge Diagnosis: Hyperlipidemia  Coronary artery disease with prior saphenous vein graft surgery to the distal right coronary 1987. The patient has subsequently had 3 mid LAD stents placed prior to this admission    Significant Diagnostic Studies: Drug-eluting stent implantation mid LAD.  Consults: None  Hospital Course: The patient had an uneventful procedure. Please refer to the procedure note. There was some discrepancy between the myocardial perfusion images and the lesion treated. Because of this a fractional flow reserve was performed and the PDA distribution without evidence of hemodynamic significance and an eccentric distal anastomosis stenosis. The patient had an uneventful evening of recovery and was deemed a candidate for discharge on 01/05/2012. Appropriate cardiac rehabilitation instructions were given    Discharge Exam: Blood pressure 105/57, pulse 64, temperature 97.5 F (36.4 C), temperature source Oral, resp. rate 19, height 5\' 7"  (1.702 m), weight 60.4 kg (133 lb 2.5 oz), SpO2 92.00%.   Right groin cath site is unremarkable without evidence of hematoma or ecchymosis. Femoral pulses 2+ and posterior tibial pulses 1-2+ as well. Labs:   Lab Results  Component Value Date   WBC 7.9 01/05/2012   HGB 11.1* 01/05/2012   HCT 33.4* 01/05/2012   MCV 90.5 01/05/2012   PLT 173 01/05/2012    Lab 01/05/12 0500  NA 138  K 3.9  CL 102  CO2 26  BUN 16  CREATININE 0.91  CALCIUM 9.3  PROT --    BILITOT --  ALKPHOS --  ALT --  AST --  GLUCOSE 95    Radiology: No new. EKG:  Normal sinus rhythm with first-degree AV block  FOLLOW UP PLANS AND APPOINTMENTS Discharge Orders    Future Orders Please Complete By Expires   Amb Referral to Cardiac Rehabilitation        Medication List  As of 01/05/2012  9:39 AM   TAKE these medications         aspirin 325 MG EC tablet   Take 1 tablet (325 mg total) by mouth daily.      calcium carbonate 500 MG chewable tablet   Commonly known as: TUMS - dosed in mg elemental calcium   Chew 1 tablet by mouth daily as needed. For heartburn      clopidogrel 75 MG tablet   Commonly known as: PLAVIX   Take 1 tablet (75 mg total) by mouth daily with breakfast.      multivitamin with minerals Tabs   Take 1 tablet by mouth daily.      nitroGLYCERIN 0.4 MG SL tablet   Commonly known as: NITROSTAT   Place 0.4 mg under the tongue every 5 (five) minutes as needed. For chest pain      rosuvastatin 10 MG tablet   Commonly known as: CRESTOR   Take 10 mg by mouth daily.           Follow-up Information    Follow up with Donato Schultz, MD on 01/12/2012. (10:30A)    Contact information:   [address] [place] [address]  [phone]          BRING ALL MEDICATIONS WITH YOU TO FOLLOW UP APPOINTMENTS  Time spent with patient to include physician time: 20 minutes  Signed: Lesleigh Noe 01/05/2012, 9:39 AM

## 2012-01-12 ENCOUNTER — Encounter (HOSPITAL_COMMUNITY): Payer: Self-pay | Admitting: Emergency Medicine

## 2012-01-12 ENCOUNTER — Observation Stay (HOSPITAL_COMMUNITY)
Admission: EM | Admit: 2012-01-12 | Discharge: 2012-01-13 | Disposition: A | Discharge status: Home / Self Care | Payer: Medicare Other | Attending: Cardiology | Admitting: Cardiology

## 2012-01-12 ENCOUNTER — Observation Stay (HOSPITAL_COMMUNITY): Payer: Medicare Other

## 2012-01-12 ENCOUNTER — Emergency Department (HOSPITAL_COMMUNITY): Payer: Medicare Other

## 2012-01-12 DIAGNOSIS — M47814 Spondylosis without myelopathy or radiculopathy, thoracic region: Secondary | ICD-10-CM | POA: Insufficient documentation

## 2012-01-12 DIAGNOSIS — R071 Chest pain on breathing: Principal | ICD-10-CM | POA: Diagnosis present

## 2012-01-12 DIAGNOSIS — E785 Hyperlipidemia, unspecified: Secondary | ICD-10-CM | POA: Diagnosis present

## 2012-01-12 DIAGNOSIS — R079 Chest pain, unspecified: Secondary | ICD-10-CM

## 2012-01-12 DIAGNOSIS — I2510 Atherosclerotic heart disease of native coronary artery without angina pectoris: Secondary | ICD-10-CM | POA: Diagnosis present

## 2012-01-12 HISTORY — DX: Chronic obstructive pulmonary disease, unspecified: J44.9

## 2012-01-12 LAB — CBC WITH DIFFERENTIAL/PLATELET
Eosinophils Absolute: 0.2 10*3/uL (ref 0.0–0.7)
Eosinophils Relative: 2 % (ref 0–5)
Lymphs Abs: 1.6 10*3/uL (ref 0.7–4.0)
MCH: 30.4 pg (ref 26.0–34.0)
MCV: 91 fL (ref 78.0–100.0)
Platelets: 182 10*3/uL (ref 150–400)
RBC: 3.35 MIL/uL — ABNORMAL LOW (ref 4.22–5.81)
RDW: 14.4 % (ref 11.5–15.5)

## 2012-01-12 LAB — COMPREHENSIVE METABOLIC PANEL
AST: 18 U/L (ref 0–37)
Albumin: 3.6 g/dL (ref 3.5–5.2)
Alkaline Phosphatase: 60 U/L (ref 39–117)
Chloride: 104 mEq/L (ref 96–112)
Potassium: 3.5 mEq/L (ref 3.5–5.1)
Sodium: 139 mEq/L (ref 135–145)
Total Bilirubin: 0.5 mg/dL (ref 0.3–1.2)

## 2012-01-12 LAB — CBC
HCT: 33.1 % — ABNORMAL LOW (ref 39.0–52.0)
Hemoglobin: 10.9 g/dL — ABNORMAL LOW (ref 13.0–17.0)
MCH: 29.8 pg (ref 26.0–34.0)
MCHC: 32.9 g/dL (ref 30.0–36.0)
RBC: 3.66 MIL/uL — ABNORMAL LOW (ref 4.22–5.81)

## 2012-01-12 LAB — BASIC METABOLIC PANEL
BUN: 14 mg/dL (ref 6–23)
CO2: 26 mEq/L (ref 19–32)
Calcium: 9.4 mg/dL (ref 8.4–10.5)
Creatinine, Ser: 0.91 mg/dL (ref 0.50–1.35)
Glucose, Bld: 106 mg/dL — ABNORMAL HIGH (ref 70–99)

## 2012-01-12 LAB — TROPONIN I: Troponin I: <0.3 ng/mL (ref <0.30)

## 2012-01-12 MED ORDER — ENOXAPARIN SODIUM 40 MG/0.4ML ~~LOC~~ SOLN
40.0000 mg | SUBCUTANEOUS | Status: DC
  Administered 2012-01-12: 40 mg via SUBCUTANEOUS
  Filled 2012-01-12 (×2): qty 0.4

## 2012-01-12 MED ORDER — KETOROLAC TROMETHAMINE 30 MG/ML IJ SOLN
30.0000 mg | Freq: Once | INTRAMUSCULAR | Status: AC
  Administered 2012-01-12: 30 mg via INTRAVENOUS
  Filled 2012-01-12: qty 1

## 2012-01-12 MED ORDER — MORPHINE SULFATE 4 MG/ML IJ SOLN
4.0000 mg | Freq: Once | INTRAMUSCULAR | Status: AC
  Administered 2012-01-12: 4 mg via INTRAVENOUS
  Filled 2012-01-12: qty 1

## 2012-01-12 MED ORDER — CLOPIDOGREL BISULFATE 75 MG PO TABS
75.0000 mg | ORAL_TABLET | Freq: Every day | ORAL | Status: DC
  Administered 2012-01-13: 75 mg via ORAL
  Filled 2012-01-12: qty 1

## 2012-01-12 MED ORDER — NITROGLYCERIN 2 % TD OINT
0.5000 [in_us] | TOPICAL_OINTMENT | Freq: Once | TRANSDERMAL | Status: AC
  Administered 2012-01-12: 0.5 [in_us] via TOPICAL
  Filled 2012-01-12: qty 1

## 2012-01-12 MED ORDER — ASPIRIN 81 MG PO CHEW
324.0000 mg | CHEWABLE_TABLET | ORAL | Status: AC
  Administered 2012-01-12: 324 mg via ORAL
  Filled 2012-01-12: qty 4

## 2012-01-12 MED ORDER — PANTOPRAZOLE SODIUM 40 MG PO TBEC
40.0000 mg | DELAYED_RELEASE_TABLET | Freq: Every day | ORAL | Status: DC
  Administered 2012-01-12 – 2012-01-13 (×2): 40 mg via ORAL
  Filled 2012-01-12: qty 1

## 2012-01-12 MED ORDER — IBUPROFEN 600 MG PO TABS
600.0000 mg | ORAL_TABLET | Freq: Four times a day (QID) | ORAL | Status: DC
  Administered 2012-01-12 – 2012-01-13 (×4): 600 mg via ORAL
  Filled 2012-01-12 (×7): qty 1

## 2012-01-12 MED ORDER — ASPIRIN 300 MG RE SUPP
300.0000 mg | RECTAL | Status: AC
  Filled 2012-01-12: qty 1

## 2012-01-12 MED ORDER — ATORVASTATIN CALCIUM 40 MG PO TABS
40.0000 mg | ORAL_TABLET | Freq: Every day | ORAL | Status: DC
  Administered 2012-01-12: 40 mg via ORAL
  Filled 2012-01-12 (×2): qty 1

## 2012-01-12 MED ORDER — ACETAMINOPHEN 325 MG PO TABS
650.0000 mg | ORAL_TABLET | ORAL | Status: DC | PRN
  Administered 2012-01-13: 650 mg via ORAL
  Filled 2012-01-12: qty 2

## 2012-01-12 MED ORDER — NITROGLYCERIN 0.4 MG SL SUBL: 0.4000 mg | SUBLINGUAL_TABLET | SUBLINGUAL | Status: DC | PRN

## 2012-01-12 MED ORDER — ASPIRIN EC 325 MG PO TBEC
325.0000 mg | DELAYED_RELEASE_TABLET | Freq: Every day | ORAL | Status: DC
  Administered 2012-01-13: 325 mg via ORAL
  Filled 2012-01-12: qty 1

## 2012-01-12 MED ORDER — ONDANSETRON HCL 4 MG/2ML IJ SOLN: 4.0000 mg | Freq: Four times a day (QID) | INTRAMUSCULAR | Status: DC | PRN

## 2012-01-12 NOTE — ED Notes (Signed)
Patient transported to CT 

## 2012-01-12 NOTE — ED Notes (Signed)
PER EMS- Pt states he started having L sided chest pain yesterday after noon. States the pain has became progressively worse throughout the night. States the pain is unrelieved by NTG. Pain radiates up to L shoulder and down left arm. Some nausea no vomiting. Alertx4, NAD. Vitals WNL. Patient released from hospital last week after receiving 4 stents placed.

## 2012-01-12 NOTE — ED Provider Notes (Signed)
History     CSN: [id number]  Arrival date & time 01/12/12  0225   First MD Initiated Contact with Patient 01/12/12 0234      Chief Complaint  Patient presents with  . Chest Pain    (Consider location/radiation/quality/duration/timing/severity/associated sxs/prior treatment) HPI 78 year old male presents to the emergency department with complaint of chest pain. Patient reports onset of pain yesterday afternoon around noon. Pain starts in left chest radiates into his back, shoulder, and neck. Symptoms are worse with activity, but are not reproducible with movement or palpation. Patient recently admitted and had new stent placed for stenosis in LAD. Patient had placement of stent on 8/29. He has been doing well since discharge. He denies missing any doses of his aspirin or Plavix. Patient was trying to wait out the pain until he saw his doctor later this morning. Patient has taken nitroglycerin tablets several times with only minimal improvement in symptoms. 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 Patient has had nausea but no vomiting. He denies any diaphoresis. He has mild shortness of breath.  Past Medical History  Diagnosis Date  . Complication of anesthesia 1987    "didn't wake up for 5 days"  . Coronary artery disease   . High cholesterol   . Anginal pain   . Myocardial infarction 1987  . Exertional dyspnea 01/04/2012  . History of blood transfusion 1987    "emergent CABG"  . Numbness and tingling in hands 01/04/2012    "right one goes to sleep on steering wheel when I'm driving; been that way long time; @ least 1 yr"    Past Surgical History  Procedure Date  . Coronary angioplasty with stent placement ? 1990's    "3"  . Coronary angioplasty with stent placement 01/04/2012    "2; total of 5"  . Wrist fracture surgery ~ 2009    left  . Wrist hardware removal ~ 2010    "took steel bar out"  . Colonoscopy w/ polypectomy 2011   "had to take it out in 3 pieces; all benign"  . Cataract extraction w/ intraocular lens  implant, bilateral 2012  . Coronary artery bypass graft 1987    CABG X1    No family history on file.  History  Substance Use Topics  . Smoking status: Former Smoker -- 1.0 packs/day for 40 years    Types: Cigarettes  . Smokeless tobacco: Never Used   Comment: 01/04/2012 "hadn't smoked in 15-20 years"  . Alcohol Use: Yes     01/04/2012 "hadn't drank any alcohol in over 15 years"      Review of Systems  All other systems reviewed and are negative.    Allergies  Review of patient's allergies indicates no known allergies.  Home Medications   Current Outpatient Rx  Name Route Sig Dispense Refill  . ASPIRIN 325 MG PO TBEC Oral Take 1 tablet (325 mg total) by mouth daily. 30 tablet   . CALCIUM CARBONATE ANTACID 500 MG PO CHEW Oral Chew 1 tablet by mouth daily as needed. For heartburn    . CLOPIDOGREL BISULFATE 75 MG PO TABS Oral Take 1 tablet (75 mg total) by mouth daily with breakfast. 30 tablet 11    Dispense as written.  . ADULT MULTIVITAMIN W/MINERALS CH Oral Take 1 tablet by mouth daily.    Marland Kitchen NITROGLYCERIN 0.4 MG SL SUBL Sublingual Place 0.4 mg under the tongue every 5 (five) minutes as needed. For  chest pain    . ROSUVASTATIN CALCIUM 10 MG PO TABS Oral Take 10 mg by mouth daily.      BP 135/67  Pulse 70  Temp 98 F (36.7 C) (Oral)  Resp 17  SpO2 96%  Physical Exam  Nursing note and vitals reviewed. Constitutional: He is oriented to person, place, and time. He appears well-developed and well-nourished. No distress.  HENT:  Head: Normocephalic and atraumatic.  Nose: Nose normal.  Mouth/Throat: Oropharynx is clear and moist.  Eyes: Conjunctivae and EOM are normal. Pupils are equal, round, and reactive to light.  Neck: Normal range of motion. Neck supple. No JVD present. No tracheal deviation present. No thyromegaly present.  Cardiovascular: Normal rate, regular rhythm, normal  heart sounds and intact distal pulses.  Exam reveals no gallop and no friction rub.   No murmur heard. Pulmonary/Chest: Effort normal and breath sounds normal. No stridor. No respiratory distress. He has no wheezes. He has no rales. He exhibits no tenderness.  Abdominal: Soft. Bowel sounds are normal. He exhibits no distension and no mass. There is no tenderness. There is no rebound and no guarding.  Musculoskeletal: Normal range of motion. He exhibits no edema and no tenderness.  Lymphadenopathy:    He has no cervical adenopathy.  Neurological: He is alert and oriented to person, place, and time. He exhibits normal muscle tone. Coordination normal.  Skin: Skin is dry. No rash noted. He is not diaphoretic. No erythema. No pallor.  Psychiatric: He has a normal mood and affect. His behavior is normal. Judgment and thought content normal.    ED Course  Procedures (including critical care time)  Labs Reviewed  BASIC METABOLIC PANEL - Abnormal; Notable for the following:    Glucose, Bld 106 (*)     GFR calc non Af Amer 80 (*)     All other components within normal limits  CBC WITH DIFFERENTIAL - Abnormal; Notable for the following:    WBC 11.9 (*)     RBC 3.35 (*)     Hemoglobin 10.2 (*)     HCT 30.5 (*)     Neutro Abs 8.4 (*)     Monocytes Relative 14 (*)     Monocytes Absolute 1.7 (*)     All other components within normal limits  TROPONIN I   Dg Chest 2 View  01/12/2012  *RADIOLOGY REPORT*  Clinical Data: Sharp stabbing left side chest pain, coronary disease post CABG and recent stent placement  CHEST - 2 VIEW  Comparison: 11/13/2008  Findings: Normal heart size post median sternotomy and coronary artery stenting. Atherosclerotic calcification aortic arch. Mediastinal contours and pulmonary vascularity normal. Emphysematous changes with right apical and right basilar scarring, slightly increased at the right apex. No definite acute infiltrate, pleural effusion or pneumothorax. No acute  osseous findings. Bones appear demineralized.  IMPRESSION: Changes of COPD with right basilar and slightly increased right apical scarring. No definite acute abnormalities.   Original Report Authenticated By: Lollie Marrow, M.D.     Date: 01/12/2012  Rate: 70  Rhythm: normal sinus rhythm  QRS Axis: normal  Intervals: normal  ST/T Wave abnormalities: normal  Conduction Disutrbances:none  Narrative Interpretation: PR shortened from prior  Old EKG Reviewed: changes noted    1. Chest pain       MDM  77 year old male who is one week out from angioplasty and stent placement. Workup here without significant abnormalities. Patient still with ongoing pain no improvement with morphine. Will place nitroglycerin paste.  Have discussed case with on-call cardiologist who will see patient in the emergency department.        Olivia Mackie, MD 01/12/12 [phone]

## 2012-01-12 NOTE — Care Management Note (Unsigned)
    Page 1 of 1   01/12/2012     3:25:24 PM   CARE MANAGEMENT NOTE 01/12/2012  Patient:  [name], [name]   Account Number:  [id number]  Date Initiated:  01/12/2012  Documentation initiated by:  GRAVES-BIGELOW,Ghassan Coggeshall  Subjective/Objective Assessment:   Pt admitted with cp. Pt is form home with wife.     Action/Plan:   CM will continue to monitor for disposition needs if any.   Anticipated DC Date:  01/13/2012   Anticipated DC Plan:  HOME/SELF CARE         Choice offered to / List presented to:             Status of service:  In process, will continue to follow Medicare Important Message given?   (If response is "NO", the following Medicare IM given date fields will be blank) Date Medicare IM given:   Date Additional Medicare IM given:    Discharge Disposition:    Per UR Regulation:  Reviewed for med. necessity/level of care/duration of stay  If discussed at Long Length of Stay Meetings, dates discussed:    Comments:

## 2012-01-12 NOTE — H&P (Addendum)
Admit date: 01/12/2012 PCP: Dr. Cam Hai Primary Cardiologist  Anne Fu  CC: Chest pain  HPI: 78 year old male with coronary artery disease status post bypass surgery, SVG to RCA single-vessel bypass with subsequent intervention to his LAD who recently underwent percutaneous intervention on 01/04/12 to his mid LAD with DES who presents with chest pain. His chest pain began yesterday at approximately 11:30 AM and was quite severe in severity/intense, sharp and at times was associated with left neck and left shoulder pain. He clearly describes worsening of his discomfort with taking in a deep breath or twisting his torso or raising his left arm. This is still present. When I first entered the room this morning, he was laying fairly comfortably with the head of bed at approximately 15. When he reached up to shake my hand he winced in discomfort. He has not had any fever, no significant cough. He is a remote smoker. He does have atypical changes suggestive of emphysema on his chest x-ray. There is no obvious pneumothorax. He has not had any prolonged leg swelling or extended car ride/airplane travel. He has had issues with his left shoulder in the past but he states that the pain is different. He also clearly states that this pain that he is having is different from the discomfort that he was experiencing prior to his stent placement.   PMH:   Past Medical History  Diagnosis Date  . Complication of anesthesia 1987    "didn't wake up for 5 days"  . Coronary artery disease   . High cholesterol   . Anginal pain   . Myocardial infarction 1987  . Exertional dyspnea 01/04/2012  . History of blood transfusion 1987    "emergent CABG"  . Numbness and tingling in hands 01/04/2012    "right one goes to sleep on steering wheel when I'm driving; been that way long time; @ least 1 yr"    PSH:   Past Surgical History  Procedure Date  . Coronary angioplasty with stent placement ? 1990's    "3"  . Coronary  angioplasty with stent placement 01/04/2012    "2; total of 5"  . Wrist fracture surgery ~ 2009    left  . Wrist hardware removal ~ 2010    "took steel bar out"  . Colonoscopy w/ polypectomy 2011    "had to take it out in 3 pieces; all benign"  . Cataract extraction w/ intraocular lens  implant, bilateral 2012  . Coronary artery bypass graft 1987    CABG X1   Allergies:  Review of patient's allergies indicates no known allergies. Prior to Admit Meds:   (Not in a hospital admission) Fam HX:   No family history on file. Social HX:    History   Social History  . Marital Status: Married    Spouse Name: N/A    Number of Children: N/A  . Years of Education: N/A   Occupational History  . Not on file.   Social History Main Topics  . Smoking status: Former Smoker -- 1.0 packs/day for 40 years    Types: Cigarettes  . Smokeless tobacco: Never Used   Comment: 01/04/2012 "hadn't smoked in 15-20 years"  . Alcohol Use: Yes     01/04/2012 "hadn't drank any alcohol in over 15 years"  . Drug Use: No  . Sexually Active: Yes   Other Topics Concern  . Not on file   Social History Narrative  . No narrative on file  ROS: He describes some dizziness when sitting up. No bleeding, no syncope, no orthopnea, no PND, no dysphasia. No rashes. All 11 ROS were addressed and are negative except what is stated in the HPI  Physical Exam: Blood pressure 111/64, pulse 62, temperature 98 F (36.7 C), temperature source Oral, resp. rate 14, SpO2 96.00%.    General: Well developed, well nourished, in no acute distress Head: Eyes PERRLA, No xanthomas.   Normal cephalic and atramatic  Lungs:   Clear bilaterally to auscultation and percussion. Normal respiratory effort. No wheezes, no rales. Heart:  HRRR S1 S2 Pulses are 2+ & equal. No appreciable murmurs. No rubs          No carotid bruit. No JVD.  No abdominal bruits. No femoral bruits. Abdomen: Bowel sounds are positive, abdomen soft and non-tender  without masses. No hepatosplenomegaly. Msk: No rashes noted, reduplication of discomfort with deep inspiration is noted. Normal strength and tone for age. Extremities:   No clubbing, cyanosis or edema.  DP +1 Neuro: Alert and oriented X 3, non-focal, MAE x 4 GU: Deferred Rectal: Deferred Psych:  Good affect, responds appropriately    Labs:   Lab Results  Component Value Date   WBC 11.9* 01/12/2012   HGB 10.2* 01/12/2012   HCT 30.5* 01/12/2012   MCV 91.0 01/12/2012   PLT 182 01/12/2012    Lab 01/12/12 0250  NA 138  K 3.7  CL 101  CO2 26  BUN 14  CREATININE 0.91  CALCIUM 9.4  PROT --  BILITOT --  ALKPHOS --  ALT --  AST --  GLUCOSE 106*   No results found for this basename: PTT   Lab Results  Component Value Date   INR 1.05 01/04/2012   Lab Results  Component Value Date   TROPONINI <0.30 01/12/2012      Radiology:  Dg Chest 2 View  01/12/2012  *RADIOLOGY REPORT*  Clinical Data: Sharp stabbing left side chest pain, coronary disease post CABG and recent stent placement  CHEST - 2 VIEW  Comparison: 11/13/2008  Findings: Normal heart size post median sternotomy and coronary artery stenting. Atherosclerotic calcification aortic arch. Mediastinal contours and pulmonary vascularity normal. Emphysematous changes with right apical and right basilar scarring, slightly increased at the right apex. No definite acute infiltrate, pleural effusion or pneumothorax. No acute osseous findings. Bones appear demineralized.  IMPRESSION: Changes of COPD with right basilar and slightly increased right apical scarring. No definite acute abnormalities.   Original Report Authenticated By: Lollie Marrow, M.D.    Personally viewed.   EKG:  No ST segment elevation or depression, no change when compared to prior. Sinus rhythm. Personally viewed.  ASSESSMENT/PLAN:   78 year old male with coronary artery disease status post single-vessel bypass SVG to PDA with recent stent placement to mid LAD, DES, changes of  COPD on chest x-ray here with pleuritic-like chest discomfort.  1. Chest pain-his EKG is reassuring, 2 sets of troponin are also reassuring. Based upon history, change with deep inspiration, change with motion of his left arm and twisting of his torso his pain is either musculoskeletal or pleuritic most likely. With recent stent placement, I will make sure that he does not have a pericardial effusion and we will check an echocardiogram. In addition, I will check a CT scan of his chest to exclude pleuritic pain from pulmonary emboli. I will go ahead and give him Toradol as well as ibuprofen as an NSAID to help alleviate discomfort. Continue to cycle cardiac  markers. Monitor EKG. At this point, if his DES in his LAD were occluded we would've expected cardiac markers to be elevated and EKG changes at this point. Also in review, the SVG to PDA was evaluated with FFR and was not significant. He did have inferior wall ischemia on nuclear stress test. I discussed the plan with him and his family.  2. Hyperlipidemia-continue with statin therapy.  3. Coronary artery disease-as described above.  Donato Schultz, MD  01/12/2012  8:08 AM

## 2012-01-12 NOTE — ED Notes (Addendum)
Admitting MD, Dr. Anne Fu at bedside.

## 2012-01-12 NOTE — ED Notes (Signed)
Report received, assumed care.  

## 2012-01-12 NOTE — Progress Notes (Signed)
  Echocardiogram 2D Echocardiogram has been performed.  [name] FRANCES 01/12/2012, 6:10 PM

## 2012-01-13 DIAGNOSIS — J449 Chronic obstructive pulmonary disease, unspecified: Secondary | ICD-10-CM | POA: Insufficient documentation

## 2012-01-13 DIAGNOSIS — E785 Hyperlipidemia, unspecified: Secondary | ICD-10-CM | POA: Diagnosis present

## 2012-01-13 LAB — BASIC METABOLIC PANEL WITH GFR
BUN: 18 mg/dL (ref 6–23)
CO2: 24 meq/L (ref 19–32)
Calcium: 9 mg/dL (ref 8.4–10.5)
Chloride: 105 meq/L (ref 96–112)
Creatinine, Ser: 0.94 mg/dL (ref 0.50–1.35)
GFR calc Af Amer: >90 mL/min
GFR calc non Af Amer: 79 mL/min — ABNORMAL LOW
Glucose, Bld: 90 mg/dL (ref 70–99)
Potassium: 4 meq/L (ref 3.5–5.1)
Sodium: 139 meq/L (ref 135–145)

## 2012-01-13 LAB — CBC
HCT: 32.5 % — ABNORMAL LOW (ref 39.0–52.0)
MCH: 29.4 pg (ref 26.0–34.0)
MCV: 90.3 fL (ref 78.0–100.0)
Platelets: 180 10*3/uL (ref 150–400)
RBC: 3.6 MIL/uL — ABNORMAL LOW (ref 4.22–5.81)
RDW: 14.3 % (ref 11.5–15.5)

## 2012-01-13 MED ORDER — ASPIRIN 81 MG PO TBEC: 81.0000 mg | DELAYED_RELEASE_TABLET | Freq: Every day | ORAL | Status: DC

## 2012-01-13 MED ORDER — IBUPROFEN 400 MG PO TABS: 400.0000 mg | ORAL_TABLET | Freq: Three times a day (TID) | ORAL | Status: AC

## 2012-01-13 MED ORDER — CLOPIDOGREL BISULFATE 75 MG PO TABS: 75.0000 mg | ORAL_TABLET | Freq: Every day | ORAL | Status: DC

## 2012-01-13 NOTE — Discharge Summary (Signed)
Physician Discharge Summary  Patient ID: [name] MRN: [id number] DOB/AGE: 08-24-1933 78 y.o.  Admit date: 01/12/2012 Discharge date: 01/13/2012  Primary Physician: Dr. Lupita Raider  Primary Discharge Diagnosis: 1. Pleuritic chest discomfort likely due to musculoskeletal causes  Secondary Discharge Diagnosis: 2. Coronary artery disease with recent drug-eluting stent placement 3. Hyperlipidemia 4. 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 Hyperlipidemia under treatment  Procedures::  2-D echocardiogram, CT scan of chest   Hospital Course: This 78 year-old male was admitted with sharp chest discomfort that began suddenly and was associated with twisting movements of his chest. He had a recent coronary stent and was admitted for further evaluation. He had negative cardiac enzymes and had an echocardiogram that did not show a pericardial effusion. CT scan did not show a pulmonary embolus but did show changes of emphysema. He continued to have sharp chest discomfort that was made worse with movements of twisting his thorax or with rolling to the side. It was thought that this was most likely due to some sort of musculoskeletal cause and was treated with ibuprofen. He will follow this up as an outpatient.  Discharge Exam: Blood pressure 98/58, pulse 61, temperature 97.8 F (36.6 C), temperature source Oral, resp. rate 16, height 5\' 7"  (1.702 m), weight 59.512 kg (131 lb 3.2 oz), SpO2 94.00%.   Increased AP diameter, decreased breath sounds  Labs: CBC:   Lab Results  Component Value Date   WBC 8.3 01/13/2012   HGB 10.6* 01/13/2012   HCT 32.5* 01/13/2012   MCV 90.3 01/13/2012   PLT 180 01/13/2012   CMP:  Lab 01/13/12 0600 01/12/12 1147  NA 139 --  K 4.0 --  CL 105 --  CO2 24 --  BUN 18 --  CREATININE 0.94 --  CALCIUM 9.0 --  PROT -- 6.8  BILITOT -- 0.5  ALKPHOS -- 60  ALT -- 10  AST -- 18  GLUCOSE 90 --   Cardiac  Enzymes:  Basename 01/12/12 1742 01/12/12 1147 01/12/12 0703  CKTOTAL -- -- --  CKMB -- -- --  CKMBINDEX -- -- --  TROPONINI <0.30 <0.30 <0.30    Radiology: Changes of COPD with emphysema and apical scarring  EKG: Minor nonspecific ST changes  Discharge Medications:  Parke, [place]  Home Medication Instructions [id number]   Printed on:01/13/12 0849  Medication Information                    rosuvastatin (CRESTOR) 10 MG tablet Take 10 mg by mouth daily.           Multiple Vitamin (MULTIVITAMIN WITH MINERALS) TABS Take 1 tablet by mouth daily.           nitroGLYCERIN (NITROSTAT) 0.4 MG SL tablet Place 0.4 mg under the tongue every 5 (five) minutes as needed. For chest pain           calcium carbonate (TUMS - DOSED IN MG ELEMENTAL CALCIUM) 500 MG chewable tablet Chew 1 tablet by mouth daily as needed. For heartburn           clopidogrel (PLAVIX) 75 MG tablet Take 75 mg by mouth daily.           aspirin 81 MG EC tablet Take 1 tablet (81 mg total) by mouth daily.           ibuprofen (MOTRIN IB) 400 MG tablet Take 1 tablet (400 mg total) by mouth 3 (three) times daily with meals.  clopidogrel (PLAVIX) 75 MG tablet Take 1 tablet (75 mg total) by mouth daily with breakfast.             Followup plans and appointments: Followup with Dr. Lupita Raider next week to look for other causes of back pain. Followup with Dr. Anne Fu in one to 2 weeks  Time spent with patient to include physician time: 45 minutes  Signed: W. Ashley Royalty. MD [facility] 01/13/2012, 8:49 AM

## 2012-02-01 MED ORDER — IOHEXOL 350 MG/ML SOLN
100.0000 mL | Freq: Once | INTRAVENOUS | Status: AC | PRN
  Administered 2012-02-01: 100 mL via INTRAVENOUS

## 2012-02-06 ENCOUNTER — Encounter (HOSPITAL_COMMUNITY): Payer: Self-pay | Admitting: Internal Medicine

## 2012-02-06 ENCOUNTER — Inpatient Hospital Stay (HOSPITAL_COMMUNITY)
Admission: AD | Admit: 2012-02-06 | Discharge: 2012-02-09 | DRG: 378 | Disposition: A | Discharge status: Home / Self Care | Payer: Medicare Other | Source: Ambulatory Visit | Attending: Internal Medicine | Admitting: Internal Medicine

## 2012-02-06 DIAGNOSIS — K922 Gastrointestinal hemorrhage, unspecified: Secondary | ICD-10-CM

## 2012-02-06 DIAGNOSIS — I951 Orthostatic hypotension: Secondary | ICD-10-CM

## 2012-02-06 DIAGNOSIS — Z7982 Long term (current) use of aspirin: Secondary | ICD-10-CM

## 2012-02-06 DIAGNOSIS — I252 Old myocardial infarction: Secondary | ICD-10-CM

## 2012-02-06 DIAGNOSIS — Y92009 Unspecified place in unspecified non-institutional (private) residence as the place of occurrence of the external cause: Secondary | ICD-10-CM

## 2012-02-06 DIAGNOSIS — K31811 Angiodysplasia of stomach and duodenum with bleeding: Principal | ICD-10-CM | POA: Diagnosis present

## 2012-02-06 DIAGNOSIS — Z860100 Personal history of colon polyps, unspecified: Secondary | ICD-10-CM

## 2012-02-06 DIAGNOSIS — Z79899 Other long term (current) drug therapy: Secondary | ICD-10-CM

## 2012-02-06 DIAGNOSIS — T3995XA Adverse effect of unspecified nonopioid analgesic, antipyretic and antirheumatic, initial encounter: Secondary | ICD-10-CM | POA: Diagnosis present

## 2012-02-06 DIAGNOSIS — Z9889 Other specified postprocedural states: Secondary | ICD-10-CM

## 2012-02-06 DIAGNOSIS — Z9861 Coronary angioplasty status: Secondary | ICD-10-CM

## 2012-02-06 DIAGNOSIS — D62 Acute posthemorrhagic anemia: Secondary | ICD-10-CM

## 2012-02-06 DIAGNOSIS — I2510 Atherosclerotic heart disease of native coronary artery without angina pectoris: Secondary | ICD-10-CM

## 2012-02-06 DIAGNOSIS — Z87891 Personal history of nicotine dependence: Secondary | ICD-10-CM

## 2012-02-06 DIAGNOSIS — Z951 Presence of aortocoronary bypass graft: Secondary | ICD-10-CM

## 2012-02-06 DIAGNOSIS — J449 Chronic obstructive pulmonary disease, unspecified: Secondary | ICD-10-CM

## 2012-02-06 DIAGNOSIS — J4489 Other specified chronic obstructive pulmonary disease: Secondary | ICD-10-CM | POA: Diagnosis present

## 2012-02-06 DIAGNOSIS — E7800 Pure hypercholesterolemia, unspecified: Secondary | ICD-10-CM | POA: Diagnosis present

## 2012-02-06 DIAGNOSIS — E785 Hyperlipidemia, unspecified: Secondary | ICD-10-CM

## 2012-02-06 DIAGNOSIS — K921 Melena: Secondary | ICD-10-CM

## 2012-02-06 DIAGNOSIS — Z7902 Long term (current) use of antithrombotics/antiplatelets: Secondary | ICD-10-CM

## 2012-02-06 HISTORY — DX: Gastrointestinal hemorrhage, unspecified: K92.2

## 2012-02-06 LAB — COMPREHENSIVE METABOLIC PANEL
AST: 23 U/L (ref 0–37)
Albumin: 3.7 g/dL (ref 3.5–5.2)
BUN: 15 mg/dL (ref 6–23)
Chloride: 106 mEq/L (ref 96–112)
Creatinine, Ser: 0.92 mg/dL (ref 0.50–1.35)
Potassium: 3.7 mEq/L (ref 3.5–5.1)
Total Bilirubin: 0.2 mg/dL — ABNORMAL LOW (ref 0.3–1.2)
Total Protein: 6.6 g/dL (ref 6.0–8.3)

## 2012-02-06 LAB — FOLATE: Folate: >20 ng/mL

## 2012-02-06 LAB — CBC
MCHC: 33 g/dL (ref 30.0–36.0)
Platelets: 216 10*3/uL (ref 150–400)
RDW: 14.6 % (ref 11.5–15.5)
WBC: 6.5 10*3/uL (ref 4.0–10.5)

## 2012-02-06 LAB — PROTIME-INR: INR: 1.07 (ref 0.00–1.49)

## 2012-02-06 LAB — PREPARE RBC (CROSSMATCH)

## 2012-02-06 LAB — ABO/RH: ABO/RH(D): A POS

## 2012-02-06 LAB — RETICULOCYTES
RBC.: 2.11 MIL/uL — ABNORMAL LOW (ref 4.22–5.81)
Retic Count, Absolute: 63.3 10*3/uL (ref 19.0–186.0)
Retic Ct Pct: 3 % (ref 0.4–3.1)

## 2012-02-06 LAB — MRSA PCR SCREENING: MRSA by PCR: NEGATIVE

## 2012-02-06 LAB — VITAMIN B12: Vitamin B-12: 308 pg/mL (ref 211–911)

## 2012-02-06 LAB — IRON AND TIBC: UIBC: 364 ug/dL (ref 125–400)

## 2012-02-06 MED ORDER — PANTOPRAZOLE SODIUM 40 MG IV SOLR
40.0000 mg | Freq: Two times a day (BID) | INTRAVENOUS | Status: DC
  Administered 2012-02-06 – 2012-02-09 (×6): 40 mg via INTRAVENOUS
  Filled 2012-02-06 (×8): qty 40

## 2012-02-06 MED ORDER — ATORVASTATIN CALCIUM 20 MG PO TABS
20.0000 mg | ORAL_TABLET | Freq: Every day | ORAL | Status: DC
  Administered 2012-02-06 – 2012-02-08 (×3): 20 mg via ORAL
  Filled 2012-02-06 (×4): qty 1

## 2012-02-06 MED ORDER — CARBOXYMETHYLCELLULOSE SODIUM 1 % OP SOLN: 1.0000 [drp] | Freq: Three times a day (TID) | OPHTHALMIC | Status: DC | PRN

## 2012-02-06 MED ORDER — SODIUM CHLORIDE 0.9 % IV SOLN: INTRAVENOUS | Status: DC

## 2012-02-06 MED ORDER — ASPIRIN 81 MG PO TBEC: 81.0000 mg | DELAYED_RELEASE_TABLET | Freq: Every day | ORAL | Status: DC

## 2012-02-06 MED ORDER — ASPIRIN EC 81 MG PO TBEC
81.0000 mg | DELAYED_RELEASE_TABLET | Freq: Every day | ORAL | Status: DC
  Administered 2012-02-07 – 2012-02-09 (×3): 81 mg via ORAL
  Filled 2012-02-06 (×3): qty 1

## 2012-02-06 MED ORDER — ACETAMINOPHEN 650 MG RE SUPP: 650.0000 mg | Freq: Four times a day (QID) | RECTAL | Status: DC | PRN

## 2012-02-06 MED ORDER — CLOPIDOGREL BISULFATE 75 MG PO TABS
75.0000 mg | ORAL_TABLET | Freq: Every day | ORAL | Status: DC
  Administered 2012-02-07: 75 mg via ORAL
  Filled 2012-02-06: qty 1

## 2012-02-06 MED ORDER — ACETAMINOPHEN 325 MG PO TABS
650.0000 mg | ORAL_TABLET | Freq: Four times a day (QID) | ORAL | Status: DC | PRN
  Administered 2012-02-07: 650 mg via ORAL
  Filled 2012-02-06 (×2): qty 2

## 2012-02-06 MED ORDER — POLYVINYL ALCOHOL 1.4 % OP SOLN
1.0000 [drp] | Freq: Three times a day (TID) | OPHTHALMIC | Status: DC | PRN
Start: 2012-02-06 — End: 2012-02-09
  Filled 2012-02-06: qty 15

## 2012-02-06 MED ORDER — SODIUM CHLORIDE 0.9 % IJ SOLN
3.0000 mL | Freq: Two times a day (BID) | INTRAMUSCULAR | Status: DC
  Administered 2012-02-07 – 2012-02-09 (×4): 3 mL via INTRAVENOUS

## 2012-02-06 NOTE — Care Management Note (Signed)
    Page 1 of 1   02/06/2012     2:45:06 PM   CARE MANAGEMENT NOTE 02/06/2012  Patient:  [name], [name]   Account Number:  [id number]  Date Initiated:  02/06/2012  Documentation initiated by:  Junius Creamer  Subjective/Objective Assessment:   adm w gi bleed     Action/Plan:   lives w wife, pcp dr Lupita Raider   Anticipated DC Date:     Anticipated DC Plan:        DC Planning Services  CM consult      Choice offered to / List presented to:             Status of service:   Medicare Important Message given?   (If response is "NO", the following Medicare IM given date fields will be blank) Date Medicare IM given:   Date Additional Medicare IM given:    Discharge Disposition:    Per UR Regulation:  Reviewed for med. necessity/level of care/duration of stay  If discussed at Long Length of Stay Meetings, dates discussed:    Comments:  10/1 14:44p debbie Abhi Moccia rn,bsn [phone]

## 2012-02-06 NOTE — Progress Notes (Signed)
Subjective: Patient admitted for weakness, presyncope, melena for 5 days, anemia. He has some epigastric discomfort. Is on 1200 mg ibuprofen (? Reason stent per patient); is on aspirin (81 mg/day) and clopidigrel for cardiac stents.  Objective: Vital signs in last 24 hours: Temp:  [98.1 F (36.7 C)-98.6 F (37 C)] 98.6 F (37 C) (10/01 1700) Pulse Rate:  [56-73] 60  (10/01 1700) Resp:  [14-18] 15  (10/01 1700) BP: (100-122)/(40-54) 105/47 mmHg (10/01 1700) SpO2:  [99 %-100 %] 100 % (10/01 1638) Weight:  [61.5 kg (135 lb 9.3 oz)] 61.5 kg (135 lb 9.3 oz) (10/01 1320) Weight change:  Last BM Date: 02/06/12  PE: GEN:  NAD ABD:  Mild epigastric soreness  Lab Results: CBC    Component Value Date/Time   WBC 6.5 02/06/2012 1418   RBC 2.11* 02/06/2012 1418   HGB 6.3* 02/06/2012 1418   HCT 19.1* 02/06/2012 1418   PLT 216 02/06/2012 1418   MCV 90.5 02/06/2012 1418   MCH 29.9 02/06/2012 1418   MCHC 33.0 02/06/2012 1418   RDW 14.6 02/06/2012 1418   LYMPHSABS 1.6 01/12/2012 0250   MONOABS 1.7* 01/12/2012 0250   EOSABS 0.2 01/12/2012 0250   BASOSABS 0.0 01/12/2012 0250   Assessment:  1.  Melena.  Suspect NSAID-related ulcer.  Hemodynamically stable. 2.  Acute blood loss anemia. 3.  Fatigue, likely from #2 above. 4.  Cardiac stent placement, on aspirin and clopidigrel.  Plan:  1.  PPI therapy. 2.  Cardiology would like to continue antiplatelet therapy given recent cardiac stent placement. 3.  Endoscopy tomorrow 02/07/12 afternoon. 4.  Clear liquid diet ok, npo after breakfast in the morning. 5.  Will follow; thank you for the consult.  [name] 02/06/2012, 5:49 PM

## 2012-02-06 NOTE — Consult Note (Signed)
Admit date: 02/06/12 Primary Physician  : Dr. Cam Hai Primary Cardiologist  : Dr Anne Fu  CC: Anemia-hemoglobin 7.78  HPI: 78 year old male with coronary artery disease status post coronary artery intervention to his mid LAD with DES on 12/31/11, prior SVG to RCA single-vessel bypass with newly discovered anemia, hemoglobin 6.4. He came to clinic today with increasing shortness of breath and fatigue as well as chest tightness since Friday. He discontinued ibuprofen at that time. He had been taking it since 01/13/12 after a brief admission for pleuritic-like chest discomfort. He has also been on Plavix as well as aspirin. He is describing symptoms similar to that prior to his stent placement. Exertional discomfort. Felt unsteady on his feet.  Timothy Peru, NP saw him in the office setting and ordered blood work. Hemoglobin came back as significantly lower at 6.4. He denied any hematemesis, but notes dark stools. Is a prior history of peptic ulcer disease and has been taken care of in the past by Dr. Delma [name]. He has had prior colonic mass removed at [facility] in 2012. Per history has had prior blood transfusions.    PMH:   Past Medical History Diagnosis Date . Complication of anesthesia 1987   "didn't wake up for 5 days" . Coronary artery disease  . High cholesterol  . Anginal pain  . Myocardial infarction 1987 . Exertional dyspnea 01/04/2012 . History of blood transfusion 1987   "emergent CABG" . Numbness and tingling in hands 01/04/2012   "right one goes to sleep on steering wheel when I'm driving; been that way long time; @ least 1 yr" . COPD (chronic obstructive pulmonary disease)    PSH:   Past Surgical History Procedure Date . Coronary angioplasty with stent placement ? 1990's   "3" . Coronary angioplasty with stent placement 01/04/2012   "2; total of 5" . Wrist fracture surgery ~ 2009   left . Wrist hardware removal ~ 2010   "took steel bar out" . Colonoscopy w/  polypectomy 2011   "had to take it out in 3 pieces; all benign" . Cataract extraction w/ intraocular lens  implant, bilateral 2012 . Coronary artery bypass graft 1987   CABG X1  Allergies:  Review of patient's allergies indicates no known allergies. Prior to Admit Meds:     Centrum Silver [employer] . Tablet 1 tablet once a day    Ibuprofen 400 MG Tablet 1 tablet every 8 hrs (recently stopped after taking for about 3 weeks)   Aspirin EC 81 MG Tablet Delayed Release 1 tablet Once a day    Plavix 75 MG Tablet 1 tablet Once a day    Nitroglycerin 0.4 mg prn chest pain    Crestor 10 MG Tablet 1 tablet once a day    Medication List reviewed and reconciled with the patient    Fam HX:   No family history on file. Father: deceased MI at 13    Mother: deceased   No early CAD Negative Gi Family History   Social HX:    History  Social History . Marital Status: Married   Spouse Name: N/A   Number of Children: N/A . Years of Education: N/A  Occupational History . Not on file.  Social History Main Topics . Smoking status: Former Smoker -- 1.0 packs/day for 40 years   Types: Cigarettes . Smokeless tobacco: Never Used  Comment: 01/04/2012 "hadn't smoked in 15-20 years" . Alcohol Use: Yes    01/04/2012 "hadn't drank any alcohol in over  15 years" . Drug Use: No . Sexually Active: Yes  Other Topics Concern . Not on file  Social History Narrative . No narrative on file    ROS:  All 11 ROS were addressed and are negative except what is stated in the HPI  Physical Exam: Blood pressure 98/58, pulse 61, temperature 97.8 F (36.6 C), temperature source Oral, resp. rate 16, height 5\' 7"  (1.702 m), weight 59.512 kg (131 lb 3.2 oz), SpO2 94.00%.    General: Well developed, well nourished, in no acute distress Head: Eyes PERRLA, No xanthomas.   Pale conjunctiva. Normal cephalic and atramatic  Lungs:   Clear bilaterally to auscultation and percussion. Normal respiratory effort.  No wheezes, no rales. Heart:   HRRR S1 S2 Pulses are 2+ & equal.No m/r/g            No carotid bruit. No JVD.  No abdominal bruits. No femoral bruits. Abdomen: Bowel sounds are positive, abdomen soft and non-tender without masses or                  Hernia's noted. No hepatosplenomegaly. Msk:  Back normal, normal gait. Normal strength and tone for age. Extremities:   No clubbing, cyanosis or edema.  DP +1 Neuro: Alert and oriented X 3, non-focal, MAE x 4 GU: Deferred Rectal: Deferred Psych:  Good affect, responds appropriately    Labs:   Lab Results Component Value Date  WBC 8.3 01/13/2012  HGB 10.6* 01/13/2012  HCT 32.5* 01/13/2012  MCV 90.3 01/13/2012  PLT 180 01/13/2012  No results found for this basename: NA,K,CL,CO2,BUN,CREATININE,CALCIUM,LABALBU,PROT,BILITOT,ALKPHOS,ALT,AST,GLUCOSE in the last 168 hours No results found for this basename: PTT  Lab Results Component Value Date  INR 1.05 01/04/2012  Lab Results Component Value Date  TROPONINI <0.30 01/12/2012  Hemoglobin 6.4, hematocrit 20.5, platelets 211, MCV 92, BUN 16, Creat 0.9, BNP <100   EKG:  Sinus rhythm, rate 68, first degree AV block, no ST segment changes. EKG done today in clinic Personally viewed.  ASSESSMENT/PLAN:   78 year old male with coronary artery disease status post single-vessel bypass in 1987 SVG to RCA, with recent DES to LAD on 01/04/12 on dual antiplatelet therapy as well as recent ibuprofen use with symptoms of dyspnea fatigue, chest tightness and recently discovered hemoglobin of 6.4.  Anemia-appreciate assistance from hospitalist, Dr. Lavera Guise. Blood transfusion 2 units. GI consultation, he has seen Dr. Wandalee Ferdinand in the past. He has had recent discontinuation of ibuprofen. Likely peptic ulcer or upper GI source. Dark stools. However MCV normal.  Begin proton pump inhibitor.  I would like to continue with dual antiplatelet therapy given recent stent placement. Of course, if hemoglobin drops is not resolve  or continues, we may need to reconsider. Risk of in-stent thrombosis is still present at this time.  Coronary artery disease-recent DES to LAD. As described above. Symptoms most likely secondary to anemia currently.  Prior colonic mass removed-[facility]. Prior transfusions per history. Prior history of peptic ulcer disease per history. It is likely that combination of ibuprofen with dual antiplatelet therapy precipitated bleed. Will likely need upper endoscopy and perhaps lower endoscopy. In fact he was scheduled to have colonoscopy done at [facility] prior to his stent placement.   Donato Schultz, MD  02/06/2012  11:00 AM

## 2012-02-06 NOTE — Consult Note (Signed)
Error

## 2012-02-06 NOTE — H&P (Signed)
[facility] History and Physical  [name] ZOX:[id number] DOB: April 22, 1934 DOA: 02/06/2012  PCP: Lupita Raider, MD   Chief Complaint: weak, dyspnea, chest pain, having dark stools.   HPI: [name] is a 78 y.o. male with hx of CABG, CAD, recent stent placement in 12/2011, recent admission for pleuritic type chest pain - placed on nsaids - who presented to his cardiologist today c/o dyspnea and chest pain and fatigue. Labs showing a hemoglobin of 6 and patient reported multiple dark stools over the weekend . Reports also some epigastric pain since started the nsaids.    Review of Systems:  As per HPI  The patient denies anorexia, fever, weight loss,, vision loss, decreased hearing, hoarseness, peripheral edema, balance deficits, hemoptysis, incontinence, genital sores, muscle weakness, suspicious skin lesions, transient blindness, difficulty walking, depression, unusual weight change,   Past Medical History  Diagnosis Date  . Complication of anesthesia 1987    "didn't wake up for 5 days"  . Coronary artery disease   . High cholesterol   . Anginal pain   . Myocardial infarction 1987  . Exertional dyspnea 01/04/2012  . History of blood transfusion 1987    "emergent CABG"  . Numbness and tingling in hands 01/04/2012    "right one goes to sleep on steering wheel when I'm driving; been that way long time; @ least 1 yr"  . COPD (chronic obstructive pulmonary disease)    Past Surgical History  Procedure Date  . Coronary angioplasty with stent placement ? 1990's    "3"  . Coronary angioplasty with stent placement 01/04/2012    "2; total of 5"  . Wrist fracture surgery ~ 2009    left  . Wrist hardware removal ~ 2010    "took steel bar out"  . Colonoscopy w/ polypectomy 2011    "had to take it out in 3 pieces; all benign"  . Cataract extraction w/ intraocular lens  implant, bilateral 2012  . Coronary artery bypass graft 1987    CABG X1   Social History:  reports that  he has quit smoking. His smoking use included Cigarettes. He has a 40 pack-year smoking history. He has never used smokeless tobacco. He reports that he does not drink alcohol or use illicit drugs. Lives with wife at home   No Known Allergies  Fhx  Mother with stomach cancer  Prior to Admission medications   Medication Sig Start Date End Date Taking? Authorizing Provider  aspirin 81 MG EC tablet Take 81 mg by mouth daily. 01/13/12  Yes Othella Boyer, MD  calcium carbonate (TUMS - DOSED IN MG ELEMENTAL CALCIUM) 500 MG chewable tablet Chew 1 tablet by mouth daily as needed. For heartburn   Yes Historical Provider, MD  carboxymethylcellulose 1 % ophthalmic solution Apply 1 drop to eye 3 (three) times daily as needed. For dry eyes   Yes Historical Provider, MD  clopidogrel (PLAVIX) 75 MG tablet Take 75 mg by mouth daily with breakfast. 01/13/12 01/12/13 Yes Othella Boyer, MD  ibuprofen (ADVIL,MOTRIN) 200 MG tablet Take 400 mg by mouth every 8 (eight) hours as needed. For pain   Yes Historical Provider, MD  metoprolol succinate (TOPROL-XL) 25 MG 24 hr tablet Take 12.5 mg by mouth daily.   Yes Historical Provider, MD  Multiple Vitamin (MULTIVITAMIN WITH MINERALS) TABS Take 1 tablet by mouth daily.   Yes Historical Provider, MD  nitroGLYCERIN (NITROSTAT) 0.4 MG SL tablet Place 0.4 mg under the tongue every 5 (five) minutes  as needed. For chest pain   Yes Historical Provider, MD  pantoprazole (PROTONIX) 40 MG tablet Take 40 mg by mouth daily.   Yes Historical Provider, MD  rosuvastatin (CRESTOR) 10 MG tablet Take 10 mg by mouth daily.   Yes Historical Provider, MD   Physical Exam: Filed Vitals:   02/06/12 1315 02/06/12 1320  BP: 102/51   Pulse:  73  Resp: 18 17  SpO2:  99%   Patient Vitals for the past 24 hrs:  BP Pulse Resp SpO2 Height Weight  02/06/12 1320 - 73  17  99 % 5\' 7"  (1.702 m) 61.5 kg (135 lb 9.3 oz)  02/06/12 1315 102/51 mmHg - 18  - - -      General:  axox3  Eyes: perrla,  eomi, pale conjunctiva, mild scleral icterus  ENT: clear pharynx  Neck: no JVD  Cardiovascular: RRR, no M, R,G  Respiratory: CTAB no w,r,c   Abdomen: soft, mild epigastric tenderness  Skin: pale, dry, no rashes  Musculoskeletal: intact  Psychiatric: euthymic,   Neurologic: cn 2-12 intact, strength 5/5 all 4, sensation intact.   Labs on Admission:  Basic Metabolic Panel: Reported from office a BUN of 16 and creatinine 0.9  CBC: Hb of 6 reported at outside office   Assessment/Plan Principal Problem:  *GI bleed Active Problems:  CAD  Hyperlipidemia  COPD (chronic obstructive pulmonary disease)  Melena  Hx of CABG  S/P colonoscopic polypectomy   1. GI bleed - probable upper GI bleed - last EGD patient had duodenal avms, plan to start PPI and transfuse 2 units of prbcs. Would also send anemia panel , LDH to be complete and eliminate other causes of anemia (e.g. Hemolysis). Dr. Dulce Sellar from [facility] has been consulted.  2. CAD s/p recent stents - d/w [facility] Dr. Anne Fu and he recommended we continue aspirin and plavix 3. HL - continue statin   Code Status: full Family Communication: wife Disposition Plan: home  Time spent: 45 minutes  Conlee Sliter [facility] Pager [phone]  If 7PM-7AM, please contact night-coverage [email or web address] Password [email or web address] 02/06/2012, 1:26 PM

## 2012-02-07 ENCOUNTER — Encounter (HOSPITAL_COMMUNITY)
Admission: AD | Disposition: A | Discharge status: Home / Self Care | Payer: Self-pay | Source: Ambulatory Visit | Attending: Internal Medicine

## 2012-02-07 ENCOUNTER — Encounter (HOSPITAL_COMMUNITY): Payer: Self-pay | Admitting: *Deleted

## 2012-02-07 DIAGNOSIS — D62 Acute posthemorrhagic anemia: Secondary | ICD-10-CM

## 2012-02-07 HISTORY — PX: ESOPHAGOGASTRODUODENOSCOPY: SHX5428

## 2012-02-07 LAB — CBC
Hemoglobin: 8.2 g/dL — ABNORMAL LOW (ref 13.0–17.0)
MCH: 29.5 pg (ref 26.0–34.0)
MCHC: 34.1 g/dL (ref 30.0–36.0)
MCV: 87.5 fL (ref 78.0–100.0)
Platelets: 194 10*3/uL (ref 150–400)
Platelets: 175 10*3/uL (ref 150–400)
RBC: 2.78 MIL/uL — ABNORMAL LOW (ref 4.22–5.81)
RDW: 14.8 % (ref 11.5–15.5)
WBC: 6.1 10*3/uL (ref 4.0–10.5)
WBC: 7.3 10*3/uL (ref 4.0–10.5)

## 2012-02-07 LAB — BASIC METABOLIC PANEL
CO2: 24 mEq/L (ref 19–32)
GFR calc non Af Amer: 81 mL/min — ABNORMAL LOW (ref >90)
Glucose, Bld: 85 mg/dL (ref 70–99)
Potassium: 3.7 mEq/L (ref 3.5–5.1)
Sodium: 142 mEq/L (ref 135–145)

## 2012-02-07 LAB — TYPE AND SCREEN
ABO/RH(D): A POS
Antibody Screen: NEGATIVE
Unit division: 0
Unit division: 0

## 2012-02-07 SURGERY — EGD (ESOPHAGOGASTRODUODENOSCOPY)
Anesthesia: Moderate Sedation

## 2012-02-07 MED ORDER — MIDAZOLAM HCL 5 MG/ML IJ SOLN
INTRAMUSCULAR | Status: AC
  Filled 2012-02-07: qty 2

## 2012-02-07 MED ORDER — BUTAMBEN-TETRACAINE-BENZOCAINE 2-2-14 % EX AERO
INHALATION_SPRAY | CUTANEOUS | Status: DC | PRN
  Administered 2012-02-07: 2 via TOPICAL

## 2012-02-07 MED ORDER — CLOPIDOGREL BISULFATE 75 MG PO TABS
75.0000 mg | ORAL_TABLET | Freq: Every day | ORAL | Status: DC
  Administered 2012-02-08 – 2012-02-09 (×2): 75 mg via ORAL
  Filled 2012-02-07 (×3): qty 1

## 2012-02-07 MED ORDER — MIDAZOLAM HCL 10 MG/2ML IJ SOLN
INTRAMUSCULAR | Status: DC | PRN
  Administered 2012-02-07: 1 mg via INTRAVENOUS
  Administered 2012-02-07: 2 mg via INTRAVENOUS
  Administered 2012-02-07: 1 mg via INTRAVENOUS

## 2012-02-07 MED ORDER — FENTANYL CITRATE 0.05 MG/ML IJ SOLN
INTRAMUSCULAR | Status: DC | PRN
  Administered 2012-02-07: 25 ug via INTRAVENOUS

## 2012-02-07 MED ORDER — FENTANYL CITRATE 0.05 MG/ML IJ SOLN
INTRAMUSCULAR | Status: AC
  Filled 2012-02-07: qty 2

## 2012-02-07 NOTE — Progress Notes (Signed)
Subjective:  Feels better. No BM. No CP  Objective:  Vital Signs in the last 24 hours: Temp:  [97.6 F (36.4 C)-98.6 F (37 C)] 98 F (36.7 C) (10/02 0745) Pulse Rate:  [52-73] 67  (10/02 0747) Resp:  [13-18] 16  (10/02 0747) BP: (97-122)/(40-57) 106/56 mmHg (10/02 0747) SpO2:  [93 %-100 %] 93 % (10/02 0747) Weight:  [61.5 kg (135 lb 9.3 oz)] 61.5 kg (135 lb 9.3 oz) (10/01 1320)  Intake/Output from previous day: 10/01 0701 - 10/02 0700 In: 770 [P.O.:120; I.V.:300; Blood:350] Out: 400 [Urine:400]   Physical Exam: General: Well developed, well nourished, in no acute distress. Head:  Normocephalic and atraumatic. Lungs: Clear to auscultation and percussion. Heart: Normal S1 and S2.  No murmur, rubs or gallops.  Abdomen: soft, non-tender, positive bowel sounds. Extremities: No clubbing or cyanosis. No edema. Neurologic: Alert and oriented x 3.    Lab Results:  Basename 02/07/12 0505 02/06/12 1418  WBC 6.1 6.5  HGB 8.2* 6.3*  PLT 194 216    Basename 02/07/12 0505 02/06/12 1418  NA 142 142  K 3.7 3.7  CL 110 106  CO2 24 25  GLUCOSE 85 119*  BUN 11 15  CREATININE 0.90 0.92    Basename 02/06/12 1400  TROPONINI <0.30   Hepatic Function Panel  Basename 02/06/12 1418  PROT 6.6  ALBUMIN 3.7  AST 23  ALT 11  ALKPHOS 43  BILITOT 0.2*  BILIDIR --  IBILI --    Telemetry: SB/NSR Personally viewed.    Assessment/Plan:  Principal Problem:  *GI bleed Active Problems:  CAD  Hyperlipidemia  COPD (chronic obstructive pulmonary disease)  Melena  Hx of CABG  S/P colonoscopic polypectomy  -NPO for EGD -No NSAIDS -IV PPI -Continue DAPT (Plavix ASA) if possible given recent DES to LAD  Will follow.    Letasha Kershaw 02/07/2012, 8:39 AM

## 2012-02-07 NOTE — Op Note (Signed)
[facility] [address] [place], [address]   ENDOSCOPY PROCEDURE REPORT  PATIENT: Timothy, [name]  MR#: [id number] BIRTHDATE: 03/14/1934 , 78  yrs. old GENDER: Male ENDOSCOPIST: Wandalee Ferdinand, MD REFERRED BY: PROCEDURE DATE:  02/07/2012 PROCEDURE: ASA CLASS: 3 INDICATIONS: melena, anemia. The patient had an endoscopy last year and there was a duodenal bulb angiodysplasia. His last colonoscopy was last year. He does have a history of adenomatous polyps and he was sent to [facility] for endoscopic mucosal resection of a large polyp in the ascending colon. MEDICATIONS: fentanyl 25 mcg IV, Versed 4 mg IV TOPICAL ANESTHETIC: Cetacaine spray  DESCRIPTION OF PROCEDURE:   After the risks benefits and alternatives of the procedure were thoroughly explained, informed consent was obtained.  The Pentax Gastroscope [id number]  endoscope was introduced through the mouth and advanced to the second portion of the duodenum      , limited by Without limitations.   The instrument was slowly withdrawn as the mucosa was fully examined.      FINDINGS:  esophagus: Normal  Stomach: There was a focal area of reddish mucosa at the prepyloric antrum which was friable. This may have represented a focal angiodysplasia. The argon plasma coagulator was used to cauterize this small area. The rest of the stomach looked normal.  Duodenum: There was a small focal reddish submucosal angiodysplasia in the duodenal bulb. It was not bleeding but given the history of melena and anemia and also was cauterized using the argon plasma coagulator.  COMPLICATIONS:none  ENDOSCOPIC IMPRESSION:angiodysplasia of the duodenal bulb and probably prepyloric antrum. Both areas were cauterized with the argon plasma coagulator.   RECOMMENDATIONS:continue PPI therapy. Clear liquids tonight. If he is doing well tomorrow then I think his diet can be advanced, and probably discharged home soon. I  think he can remain on his antiplatelet therapy. If he has further melena and anemia after this treatment then I would proceed with capsule endoscopy.   REPEAT EXAM:   _______________________________ Rhodia Albright, MD 02/07/2012 4:48 PM       PATIENT NAME:  Timothy, [name] MR#: [id number]

## 2012-02-07 NOTE — Progress Notes (Signed)
[facility] Progress Note [place] TEAM 1 - Stepdown/ICU TEAM   [name] ZOX:[id number] DOB: 10-02-1933 DOA: 02/06/2012 PCP: Lupita Raider, MD  Brief narrative: 78 y.o. male with hx of CABG, CAD, recent DES stent placement to mid LAD(12/2011), recent admission for pleuritic type chest pain - placed on nsaids - who presented to his cardiologist today c/o dyspnea and chest pain and fatigue. Labs revealed a hemoglobin of 6 and patient reported multiple dark stools over the weekend . Reported also some epigastric pain since started the nsaids.   Assessment/Plan:  Acute symptomatic blood loss anemia  Hemoglobin has improved status post 2 units of packed red blood cells - continue to follow trend closely  Upper GI Bleed / melena Likely nonsteroidal anti-inflammatory related gastritis versus peptic ulcer disease - for endoscopy today - continue PPI  CAD - S/P CABG 1987 - S/P DES to LAD 01/04/2012 At this time it is felt to be too high risk to discontinue antiplatelet therapy - clinically the patient is stable at this time  Hyperlipidemia Continue medical therapy  COPD Well compensated at present  Code Status: FULL Disposition Plan: remain in SDU until Hgb stable   Consultants: GI - Outlaw Cardiology - [facility]  Procedures: EGD 02/07/2012>>pending   Antibiotics: None   DVT prophylaxis: SCDs only due to bleeding   HPI/Subjective: The patient is alert and pleasant.  He denies chest pain fevers chills nausea or vomiting.  He has not had a bowel movement since his admission.   Objective: Blood pressure 107/58, pulse 54, temperature 98 F (36.7 C), temperature source Oral, resp. rate 16, height 5\' 7"  (1.702 m), weight 61.5 kg (135 lb 9.3 oz), SpO2 96.00%.  Intake/Output Summary (Last 24 hours) at 02/07/12 1314 Last data filed at 02/07/12 1300  Gross per 24 hour  Intake    860 ml  Output    600 ml  Net    260 ml     Exam: General: No acute respiratory  distress Lungs: Clear to auscultation bilaterally without wheezes or crackles Cardiovascular: Regular rate and rhythm without murmur gallop or rub normal S1 and S2 Abdomen: Nontender, nondistended, soft, bowel sounds positive, no rebound, no ascites, no appreciable mass Extremities: No significant cyanosis, clubbing, or edema bilateral lower extremities  Data Reviewed: Basic Metabolic Panel:  Lab 02/07/12 8119 02/06/12 1418  NA 142 142  K 3.7 3.7  CL 110 106  CO2 24 25  GLUCOSE 85 119*  BUN 11 15  CREATININE 0.90 0.92  CALCIUM 8.7 9.2  MG -- --  PHOS -- --   Liver Function Tests:  Lab 02/06/12 1418  AST 23  ALT 11  ALKPHOS 43  BILITOT 0.2*  PROT 6.6  ALBUMIN 3.7   CBC:  Lab 02/07/12 0505 02/06/12 1418  WBC 6.1 6.5  NEUTROABS -- --  HGB 8.2* 6.3*  HCT 24.4* 19.1*  MCV 87.8 90.5  PLT 194 216   Cardiac Enzymes:  Lab 02/06/12 1400  CKTOTAL --  CKMB --  CKMBINDEX --  TROPONINI <0.30    Recent Results (from the past 240 hour(s))  MRSA PCR SCREENING     Status: Normal   Collection Time   02/06/12  1:05 PM      Component Value Range Status Comment   MRSA by PCR NEGATIVE  NEGATIVE Final      Studies:  Recent x-ray studies have been reviewed in detail by the Attending Physician  Scheduled Meds:  Reviewed in detail by the Attending Physician  Lonia Blood, MD [facility] Office  [phone] Pager [phone]  On-Call/Text Page:      [email or web address]      password [email or web address]  If 7PM-7AM, please contact night-coverage [email or web address] Password [email or web address] 02/07/2012, 1:14 PM   LOS: 1 day

## 2012-02-08 ENCOUNTER — Encounter (HOSPITAL_COMMUNITY): Payer: Self-pay

## 2012-02-08 ENCOUNTER — Encounter (HOSPITAL_COMMUNITY): Payer: Self-pay | Admitting: Gastroenterology

## 2012-02-08 DIAGNOSIS — J449 Chronic obstructive pulmonary disease, unspecified: Secondary | ICD-10-CM

## 2012-02-08 LAB — CBC
Hemoglobin: 8.7 g/dL — ABNORMAL LOW (ref 13.0–17.0)
MCH: 28.9 pg (ref 26.0–34.0)
MCHC: 32.6 g/dL (ref 30.0–36.0)
Platelets: 201 10*3/uL (ref 150–400)

## 2012-02-08 NOTE — Progress Notes (Addendum)
[facility] Progress Note [place] TEAM 1 - Stepdown/ICU TEAM   MARSHAWN CLINE ZOX:[id number] DOB: 10-May-1933 DOA: 02/06/2012 PCP: Lupita Raider, MD  Brief narrative: 78 y.o. male with hx of CABG, CAD, recent DES stent placement to mid LAD(12/2011), recent admission for pleuritic type chest pain - placed on nsaids - who presented to his cardiologist today c/o dyspnea and chest pain and fatigue. Labs revealed a hemoglobin of 6 and patient reported multiple dark stools over the weekend . Reported also some epigastric pain since started the nsaids.   Assessment/Plan:  Acute symptomatic blood loss anemia  Hemoglobin has improved status post 2 units of packed red blood cells - continue to follow trend closely while resuming diet.   Upper GI Bleed / melena Likely nonsteroidal anti-inflammatory related gastritis versus peptic ulcer disease - endoscopy reviewed - continue BID PPI  CAD - S/P CABG 1987 - S/P DES to LAD 01/04/2012 At this time it is felt to be too high risk to discontinue antiplatelet therapy - clinically the patient is stable   Hyperlipidemia Continue medical therapy  COPD Well compensated at present  Code Status: FULL Disposition Plan: remain in SDU until Hgb stable   Consultants: GI - Outlaw Cardiology - [facility]  Procedures: EGD 02/07/2012>>pending   Antibiotics: None   DVT prophylaxis: SCDs only due to bleeding   HPI/Subjective: The patient is alert and pleasant.  He denies chest pain fevers chills nausea or vomiting.  He has not had a bowel movement since his admission.   Objective: Blood pressure 96/48, pulse 60, temperature 97.7 F (36.5 C), temperature source Oral, resp. rate 16, height 5\' 7"  (1.702 m), weight 66.3 kg (146 lb 2.6 oz), SpO2 98.00%.  Intake/Output Summary (Last 24 hours) at 02/08/12 1758 Last data filed at 02/08/12 1600  Gross per 24 hour  Intake   1244 ml  Output    307 ml  Net    937 ml     Exam: General: No acute  respiratory distress Lungs: Clear to auscultation bilaterally without wheezes or crackles Cardiovascular: Regular rate and rhythm without murmur gallop or rub normal S1 and S2 Abdomen: Nontender, nondistended, soft, bowel sounds positive, no rebound, no ascites, no appreciable mass Extremities: No significant cyanosis, clubbing, or edema bilateral lower extremities  Data Reviewed: Basic Metabolic Panel:  Lab 02/07/12 8119 02/06/12 1418  NA 142 142  K 3.7 3.7  CL 110 106  CO2 24 25  GLUCOSE 85 119*  BUN 11 15  CREATININE 0.90 0.92  CALCIUM 8.7 9.2  MG -- --  PHOS -- --   Liver Function Tests:  Lab 02/06/12 1418  AST 23  ALT 11  ALKPHOS 43  BILITOT 0.2*  PROT 6.6  ALBUMIN 3.7   CBC:  Lab 02/08/12 0450 02/07/12 1739 02/07/12 0505 02/06/12 1418  WBC 6.0 7.3 6.1 6.5  NEUTROABS -- -- -- --  HGB 8.7* 9.8* 8.2* 6.3*  HCT 26.7* 28.7* 24.4* 19.1*  MCV 88.7 87.5 87.8 90.5  PLT 201 175 194 216   Cardiac Enzymes:  Lab 02/06/12 1400  CKTOTAL --  CKMB --  CKMBINDEX --  TROPONINI <0.30    Recent Results (from the past 240 hour(s))  MRSA PCR SCREENING     Status: Normal   Collection Time   02/06/12  1:05 PM      Component Value Range Status Comment   MRSA by PCR NEGATIVE  NEGATIVE Final      Studies:  Recent x-ray studies have been reviewed  in detail by the Attending Physician  Scheduled Meds:  Reviewed in detail by the Attending Physician   Calvert Cantor, MD [phone]  If 7PM-7AM, please contact night-coverage [email or web address] Password [email or web address] 02/08/2012, 5:58 PM   LOS: 2 days

## 2012-02-08 NOTE — Progress Notes (Signed)
Subjective:  Doing well. Minor epigastric discomfort. No change.  Objective:  Vital Signs in the last 24 hours: Temp:  [97.5 F (36.4 C)-98.2 F (36.8 C)] 97.5 F (36.4 C) (10/03 0751) Pulse Rate:  [54-71] 60  (10/03 0800) Resp:  [9-20] 9  (10/02 1707) BP: (93-150)/(49-73) 110/57 mmHg (10/03 0800) SpO2:  [94 %-100 %] 97 % (10/03 0751) Weight:  [66.3 kg (146 lb 2.6 oz)] 66.3 kg (146 lb 2.6 oz) (10/03 0600)  Intake/Output from previous day: 10/02 0701 - 10/03 0700 In: 720 [P.O.:450; I.V.:250; IV Piggyback:20] Out: 727 [Urine:727]   Physical Exam: General: Well developed, well nourished, in no acute distress. Head:  Normocephalic and atraumatic. Lungs: Clear to auscultation and percussion. Heart: Normal S1 and S2.  No murmur, rubs or gallops.  Abdomen: soft, non-tender, positive bowel sounds. Extremities: No clubbing or cyanosis. No edema. Neurologic: Alert and oriented x 3.    Lab Results:  Basename 02/08/12 0450 02/07/12 1739  WBC 6.0 7.3  HGB 8.7* 9.8*  PLT 201 175    Basename 02/07/12 0505 02/06/12 1418  NA 142 142  K 3.7 3.7  CL 110 106  CO2 24 25  GLUCOSE 85 119*  BUN 11 15  CREATININE 0.90 0.92    Basename 02/06/12 1400  TROPONINI <0.30   Hepatic Function Panel  Basename 02/06/12 1418  PROT 6.6  ALBUMIN 3.7  AST 23  ALT 11  ALKPHOS 43  BILITOT 0.2*  BILIDIR --  IBILI --    Telemetry: NSR Personally viewed.     Assessment/Plan:  Principal Problem:  *GI bleed Active Problems:  CAD  Hyperlipidemia  COPD (chronic obstructive pulmonary disease)  Melena  Hx of CABG  S/P colonoscopic polypectomy  Anemia associated with acute blood loss  - Hg 8.7.  - Feels better -CAD stable -DAPT - recent DES LAD -Iron def anemia - bleed - ?Iron suppl.  -PPI -OK from cardiac standpoint for DC -Will set up close follow up.  - Appreciate Dr. Luan Moore care. Reviewed note.       [name] 02/08/2012, 10:04 AM

## 2012-02-08 NOTE — Progress Notes (Signed)
Subjective: No reported melena overnight.  Objective: Vital signs in last 24 hours: Temp:  [97.5 F (36.4 C)-98.2 F (36.8 C)] 97.6 F (36.4 C) (10/03 1212) Pulse Rate:  [54-71] 69  (10/03 1212) Resp:  [9-20] 9  (10/02 1707) BP: (93-150)/(49-73) 110/57 mmHg (10/03 0800) SpO2:  [94 %-100 %] 96 % (10/03 1212) Weight:  [66.3 kg (146 lb 2.6 oz)] 66.3 kg (146 lb 2.6 oz) (10/03 0600) Weight change: 4.8 kg (10 lb 9.3 oz) Last BM Date: 02/06/12  PE: GEN:  Less pale-appearing, NAD ABD:  Soft  Lab Results: CBC    Component Value Date/Time   WBC 6.0 02/08/2012 0450   RBC 3.01* 02/08/2012 0450   HGB 8.7* 02/08/2012 0450   HCT 26.7* 02/08/2012 0450   PLT 201 02/08/2012 0450   MCV 88.7 02/08/2012 0450   MCH 28.9 02/08/2012 0450   MCHC 32.6 02/08/2012 0450   RDW 14.8 02/08/2012 0450   LYMPHSABS 1.6 01/12/2012 0250   MONOABS 1.7* 01/12/2012 0250   EOSABS 0.2 01/12/2012 0250   BASOSABS 0.0 01/12/2012 0250   Assessment:  1.  Anemia, melena.  EGD with gastric and duodenal AVMs, endoscopically obliterated with APC  Plan:  1.  Protonix 40 mg bid as long as patient is on clopidigrel and aspirin. 2.  Advance diet. 3.  If further bleeding, inpatient or outpatient, next step would be capsule endoscopy. 4.  Will follow.   [name] 02/08/2012, 1:41 PM

## 2012-02-09 DIAGNOSIS — I951 Orthostatic hypotension: Secondary | ICD-10-CM

## 2012-02-09 LAB — CBC
MCV: 88.5 fL (ref 78.0–100.0)
Platelets: 220 10*3/uL (ref 150–400)
RBC: 3.05 MIL/uL — ABNORMAL LOW (ref 4.22–5.81)
RDW: 14.6 % (ref 11.5–15.5)
WBC: 7.5 10*3/uL (ref 4.0–10.5)

## 2012-02-09 LAB — BASIC METABOLIC PANEL
CO2: 25 mEq/L (ref 19–32)
Chloride: 107 mEq/L (ref 96–112)
Creatinine, Ser: 0.96 mg/dL (ref 0.50–1.35)
GFR calc Af Amer: >90 mL/min (ref >90)
Potassium: 3.9 mEq/L (ref 3.5–5.1)
Sodium: 139 mEq/L (ref 135–145)

## 2012-02-09 MED ORDER — SODIUM CHLORIDE 0.9 % IV SOLN
Freq: Once | INTRAVENOUS | Status: AC
  Administered 2012-02-09: 09:00:00 via INTRAVENOUS

## 2012-02-09 MED ORDER — PANTOPRAZOLE SODIUM 40 MG PO TBEC: 40.0000 mg | DELAYED_RELEASE_TABLET | Freq: Every day | ORAL | Status: DC

## 2012-02-09 NOTE — Progress Notes (Signed)
S: BP a bit low. Mild dizziness. No active signs of bleeding.   O: SBP 95-110, HR 67  GEN: AAO x 3 LUNGS: Clear CV: RRR  AP:  1. GI bleed - PPI. H/H stable 2. Hypotension - mild. Mildly symptomatic. IVF. Liberalize salt. Fluid intake. No active signs of bleed. On no BP meds.  3. CAD - DES LAD stent.  4. Hopeful home today.

## 2012-02-09 NOTE — Progress Notes (Signed)
Patient ID: [name], male   DOB: 10-06-1933, 78 y.o.   MRN: [id number] [facility] Progress Note  [name] 78 y.o. 03-11-1934   Subjective: No BMs this morning. BM overnight but appearance unknown. Feels good. Reports one second of dizziness when standing up this morning but otherwise feels ok. Sitting up in bed eating breakfast. No further bleeding.  Objective: Vital signs in last 24 hours: Filed Vitals:   02/09/12 0800  BP: 86/46  Pulse: 71  Temp: 98.5  Resp: 20    Physical Exam: Gen: alert, no acute distress Abd: soft, NT, ND  Lab Results:  [facility] 02/09/12 0505 02/07/12 0505  NA 139 142  K 3.9 3.7  CL 107 110  CO2 25 24  GLUCOSE 98 85  BUN 14 11  CREATININE 0.96 0.90  CALCIUM 8.9 8.7  MG -- --  PHOS -- --    Basename 02/06/12 1418  AST 23  ALT 11  ALKPHOS 43  BILITOT 0.2*  PROT 6.6  ALBUMIN 3.7    Basename 02/09/12 0505 02/08/12 0450  WBC 7.5 6.0  NEUTROABS -- --  HGB 9.0* 8.7*  HCT 27.0* 26.7*  MCV 88.5 88.7  PLT 220 201    Basename 02/06/12 1418  LABPROT 13.8  INR 1.07      Assessment/Plan: 78yo s/p GI bleed likely due to AVMs s/p APC. Bleeding has resolved and Hgb stable. BP in low 90's systolic this morning but asymptomatic. Watch BPs. No evidence of ongoing bleeding. If BPs improved, then can go home from GI standpoint. Defer to primary team IVFs with low BP. Will sign off. Call if questions.   Kymoni Lesperance C. 02/09/2012, 8:25 AM

## 2012-02-14 NOTE — Discharge Summary (Signed)
Physician Discharge Summary  [name] JXB:[id number] DOB: 23-Apr-1934 DOA: 02/06/2012  PCP: Lupita Raider, MD  Admit date: 02/06/2012 Discharge date: 02/14/2012  Discharge Diagnoses:  Principal Problem:  *GI bleed Active Problems:  CAD  Hyperlipidemia  COPD (chronic obstructive pulmonary disease)  Melena  Hx of CABG  S/P colonoscopic polypectomy  Anemia associated with acute blood loss  Orthostatic hypotension   Discharge Condition: stable  Diet recommendation: heart healty, bland  Filed Weights   02/06/12 1320 02/08/12 0600  Weight: 61.5 kg (135 lb 9.3 oz) 66.3 kg (146 lb 2.6 oz)    History of present illness:  78 y.o. male with hx of CABG, CAD, recent DES stent placement to mid LAD(12/2011), recent admission for pleuritic type chest pain - placed on nsaids - who presented to his cardiologist today c/o dyspnea and chest pain and fatigue. Labs revealed a hemoglobin of 6 and patient reported multiple dark stools over the weekend . Reported also some epigastric pain since he started the nsaids.   Hospital Course:  Acute symptomatic blood loss anemia  Hemoglobin has improved status post 2 units of packed red blood cells Hgb has been stable and is 9.0 on d/c today.   Upper GI Bleed / melena  Likely nonsteroidal anti-inflammatory related gastritis versus peptic ulcer disease - endoscopy revealed angiodysplasias of duodenal bulb and prepyloric antrum. PPIs recommended along with discontinuing Motrin.    CAD - S/P CABG 1987 - S/P DES to LAD 01/04/2012  At this time it is felt to be too high risk to discontinue antiplatelet therapy - clinically the patient is stable   Hyperlipidemia  Continue medical therapy   COPD  Well compensated at present   Procedures:  EGD- Dr Herbert Moors  Consultations:GI  Discharge Exam: Filed Vitals:   02/09/12 0847 02/09/12 0848 02/09/12 0952 02/09/12 0953  BP: 95/52 98/58 101/58 106/51  Pulse:      Temp:      TempSrc:      Resp:        Height:      Weight:      SpO2:        General: alert, no acute distress Cardiovascular: RRR, no murmurs Respiratory: CTA b/l   Discharge Instructions  Discharge Orders    Future Orders Please Complete By Expires   Diet - low sodium heart healthy      Comments:   Bland diet, limited caffeine   Increase activity slowly      Discharge instructions      Comments:   Continue to monitor for black or bloody stools at home, f/u with your family doctor and have hemoglobin checked in 2 months.       Medication List     As of 02/14/2012  9:22 PM    STOP taking these medications         ibuprofen 200 MG tablet   Commonly known as: ADVIL,MOTRIN      TAKE these medications         aspirin 81 MG EC tablet   Take 81 mg by mouth daily.      calcium carbonate 500 MG chewable tablet   Commonly known as: TUMS - dosed in mg elemental calcium   Chew 1 tablet by mouth daily as needed. For heartburn      carboxymethylcellulose 1 % ophthalmic solution   Apply 1 drop to eye 3 (three) times daily as needed. For dry eyes      clopidogrel 75 MG tablet  Commonly known as: PLAVIX   Take 75 mg by mouth daily with breakfast.      metoprolol succinate 25 MG 24 hr tablet   Commonly known as: TOPROL-XL   Take 12.5 mg by mouth daily.      multivitamin with minerals Tabs   Take 1 tablet by mouth daily.      nitroGLYCERIN 0.4 MG SL tablet   Commonly known as: NITROSTAT   Place 0.4 mg under the tongue every 5 (five) minutes as needed. For chest pain      pantoprazole 40 MG tablet   Commonly known as: PROTONIX   Take 1 tablet (40 mg total) by mouth daily.      rosuvastatin 10 MG tablet   Commonly known as: CRESTOR   Take 10 mg by mouth daily.           Follow-up Information    Follow up with Donato Schultz, MD. On 02/15/2012. (2:15pm also lab CBC)    Contact information:   [address] [facility] [address] [phone]           The results of significant  diagnostics from this hospitalization (including imaging, microbiology, ancillary and laboratory) are listed below for reference.    Significant Diagnostic Studies: No results found.  Microbiology: Recent Results (from the past 240 hour(s))  MRSA PCR SCREENING     Status: Normal   Collection Time   02/06/12  1:05 PM      Component Value Range Status Comment   MRSA by PCR NEGATIVE  NEGATIVE Final      Labs: Basic Metabolic Panel:  Lab 02/09/12 9629  NA 139  K 3.9  CL 107  CO2 25  GLUCOSE 98  BUN 14  CREATININE 0.96  CALCIUM 8.9  MG --  PHOS --   Liver Function Tests: No results found for this basename: AST:5,ALT:5,ALKPHOS:5,BILITOT:5,PROT:5,ALBUMIN:5 in the last 168 hours No results found for this basename: LIPASE:5,AMYLASE:5 in the last 168 hours No results found for this basename: AMMONIA:5 in the last 168 hours CBC:  Lab 02/09/12 0505 02/08/12 0450  WBC 7.5 6.0  NEUTROABS -- --  HGB 9.0* 8.7*  HCT 27.0* 26.7*  MCV 88.5 88.7  PLT 220 201   Cardiac Enzymes: No results found for this basename: CKTOTAL:5,CKMB:5,CKMBINDEX:5,TROPONINI:5 in the last 168 hours BNP: BNP (last 3 results) No results found for this basename: PROBNP:3 in the last 8760 hours CBG: No results found for this basename: GLUCAP:5 in the last 168 hours  Time coordinating discharge: >45 minutes  Signed:  Jadore Mcguffin  [facility] 02/14/2012, 9:22 PM

## 2013-02-24 ENCOUNTER — Other Ambulatory Visit: Payer: Self-pay | Admitting: *Deleted

## 2013-02-24 DIAGNOSIS — E782 Mixed hyperlipidemia: Secondary | ICD-10-CM

## 2013-02-24 DIAGNOSIS — Z79899 Other long term (current) drug therapy: Secondary | ICD-10-CM

## 2013-04-07 ENCOUNTER — Other Ambulatory Visit (INDEPENDENT_AMBULATORY_CARE_PROVIDER_SITE_OTHER): Payer: Medicare Other

## 2013-04-07 DIAGNOSIS — E782 Mixed hyperlipidemia: Secondary | ICD-10-CM

## 2013-04-07 DIAGNOSIS — Z79899 Other long term (current) drug therapy: Secondary | ICD-10-CM

## 2013-04-07 LAB — HEPATIC FUNCTION PANEL
ALT: 14 U/L (ref 0–53)
Albumin: 4 g/dL (ref 3.5–5.2)
Total Protein: 7.2 g/dL (ref 6.0–8.3)

## 2013-04-07 LAB — LIPID PANEL
Cholesterol: 145 mg/dL (ref 0–200)
HDL: 55.7 mg/dL (ref >39.00)
LDL Cholesterol: 75 mg/dL (ref 0–99)
Triglycerides: 70 mg/dL (ref 0.0–149.0)
VLDL: 14 mg/dL (ref 0.0–40.0)

## 2013-04-20 ENCOUNTER — Other Ambulatory Visit: Payer: Self-pay | Admitting: Cardiology

## 2013-05-20 ENCOUNTER — Telehealth: Payer: Self-pay | Admitting: Cardiology

## 2013-05-20 NOTE — Telephone Encounter (Signed)
New message    crestor now cost $207.00.  Is there anything cheaper pt can have?

## 2013-05-21 NOTE — Telephone Encounter (Signed)
Patient wants an alternative due to the cost of Crestor, please advise

## 2013-05-22 NOTE — Telephone Encounter (Signed)
Stop Crestor due to cost. Start atorvastatin 40mg  PO QD.

## 2013-05-27 ENCOUNTER — Ambulatory Visit: Payer: Medicare Other | Admitting: Pharmacist

## 2013-05-27 ENCOUNTER — Encounter: Payer: Medicare Other | Admitting: Pharmacist

## 2013-05-28 ENCOUNTER — Encounter: Payer: Self-pay | Admitting: Cardiology

## 2013-06-05 ENCOUNTER — Telehealth: Payer: Self-pay | Admitting: Pharmacist

## 2013-06-05 NOTE — Telephone Encounter (Signed)
Patient missed lipid clinic appointment so I called to determine if he was on Crestor or atoravastatin.  Wife tells me he is back on Crestor as the cost of the medication went down with insurance and they are able to afford now.  Won't need to r/s lipid clinic appointment.

## 2013-07-08 ENCOUNTER — Ambulatory Visit: Payer: Medicare Other | Admitting: Cardiology

## 2013-07-15 ENCOUNTER — Encounter: Payer: Self-pay | Admitting: Cardiology

## 2013-07-21 ENCOUNTER — Encounter: Payer: Self-pay | Admitting: Cardiology

## 2013-07-21 ENCOUNTER — Ambulatory Visit (INDEPENDENT_AMBULATORY_CARE_PROVIDER_SITE_OTHER): Payer: Medicare Other | Admitting: Cardiology

## 2013-07-21 VITALS — BP 128/68 | HR 55 | Ht 67.0 in | Wt 132.0 lb

## 2013-07-21 DIAGNOSIS — I951 Orthostatic hypotension: Secondary | ICD-10-CM

## 2013-07-21 DIAGNOSIS — I2510 Atherosclerotic heart disease of native coronary artery without angina pectoris: Secondary | ICD-10-CM

## 2013-07-21 DIAGNOSIS — Z951 Presence of aortocoronary bypass graft: Secondary | ICD-10-CM

## 2013-07-21 DIAGNOSIS — E785 Hyperlipidemia, unspecified: Secondary | ICD-10-CM

## 2013-07-21 NOTE — Progress Notes (Signed)
[facility] [address], [address], [place]  [address] Phone: [phone] Fax:  [phone]  Date:  07/21/2013   ID:  [name], DOB [birth year], MRN [id number]  PCP:  Mayra Neer, MD   History of Present Illness: [name] is a 80 y.o. male with coronary artery disease status post stent, drug eluting, to LAD (01/05/12) on dual antiplatelet therapy who was previously taking NSAIDs for approximally 3 weeks duration for pleuritic chest discomfort. He ended up having a severely decreased hemoglobin and underwent blood transfusion. Upper GI/endoscopy demonstrated small AVM.   He was having mild epigastric discomfort previously. He is continuing with his dual antiplatelet therapy but has discontinued his NSAID. He hast felt "swimmy headed" for quite some time. Orthostatics have been common for him. Feels that it is back. Tingling in hands and legs. Feels some palpitations with exertion. Feels SOB with walking up stairs. But feels like he is doing a lot better than before. Occasionally will have some dizziness.  He will have an occasional twinge of chest discomfort, sharp, lightning. Fleeting. No radiation. No change with exertion or position.    Wt Readings from Last 3 Encounters:  07/21/13 132 lb (59.875 kg)  02/08/12 146 lb 2.6 oz (66.3 kg)  02/08/12 146 lb 2.6 oz (66.3 kg)     Past Medical History  Diagnosis Date  . Complication of anesthesia 1987    "didn't wake up for 5 days"  . Coronary artery disease   . High cholesterol   . Anginal pain   . Myocardial infarction 1987  . Exertional dyspnea 01/04/2012  . History of blood transfusion 1987    "emergent CABG"  . Numbness and tingling in hands 01/04/2012    "right one goes to sleep on steering wheel when I'm driving; been that way long time; @ least 1 yr"  . COPD (chronic obstructive pulmonary disease)     Past Surgical History  Procedure Laterality Date  . Coronary angioplasty with stent placement  ? 1990's      "3"  . Coronary angioplasty with stent placement  01/04/2012    "2; total of 5"  . Wrist fracture surgery  ~ 2009    left  . Wrist hardware removal  ~ 2010    "took steel bar out"  . Colonoscopy w/ polypectomy  2011    "had to take it out in 3 pieces; all benign"  . Cataract extraction w/ intraocular lens  implant, bilateral  2012  . Coronary artery bypass graft  1987    CABG X1  . Esophagogastroduodenoscopy  02/07/2012    Procedure: ESOPHAGOGASTRODUODENOSCOPY (EGD);  Surgeon: Wonda Horner, MD;  Location: [facility] ENDOSCOPY;  Service: Endoscopy;  Laterality: N/A;    Current Outpatient Prescriptions  Medication Sig Dispense Refill  . aspirin 81 MG EC tablet Take 81 mg by mouth daily.      . calcium carbonate (TUMS - DOSED IN MG ELEMENTAL CALCIUM) 500 MG chewable tablet Chew 1 tablet by mouth daily as needed. For heartburn      . carboxymethylcellulose 1 % ophthalmic solution Apply 1 drop to eye 3 (three) times daily as needed. For dry eyes      . clopidogrel (PLAVIX) 75 MG tablet Take 75 mg by mouth once.       . metoprolol succinate (TOPROL-XL) 25 MG 24 hr tablet Take 12.5 mg by mouth daily.      . Multiple Vitamin (MULTIVITAMIN WITH MINERALS) TABS Take  1 tablet by mouth daily.      . nitroGLYCERIN (NITROSTAT) 0.4 MG SL tablet Place 0.4 mg under the tongue every 5 (five) minutes as needed. For chest pain      . pantoprazole (PROTONIX) 40 MG tablet TAKE 1 TABLET ONCE A DAY  30 tablet  5  . rosuvastatin (CRESTOR) 10 MG tablet Take 10 mg by mouth daily.       No current facility-administered medications for this visit.    Allergies:   No Known Allergies  Social History:  The patient  reports that he has quit smoking. His smoking use included Cigarettes. He has a 40 pack-year smoking history. He has never used smokeless tobacco. He reports that he does not drink alcohol or use illicit drugs.  Security guard.  ROS:  Please see the history of present illness.   Denies any syncope,  bleeding, orthopnea, PND  PHYSICAL EXAM: VS:  BP 128/68  Pulse 55  Ht 5\' 7"  (1.702 m)  Wt 132 lb (59.875 kg)  BMI 20.67 kg/m2 Thin, in no acute distress HEENT: normal Neck: no JVD Cardiac:  normal S1, S2; RRR; no murmur Lungs:  clear to auscultation bilaterally, no wheezing, rhonchi or rales Abd: soft, nontender, no hepatomegaly Ext: no edema Skin: warm and dry Neuro: no focal abnormalities noted  EKG:  Sinus bradycardia with mild sinus arrhythmia, heart rate 55 beats per minute. No other abnormalities.  Labs: 07/07/13-hemoglobin 11.5-stable, LDL 94  ASSESSMENT AND PLAN:  1. Coronary artery disease-status post bypass, previous percutaneous intervention, DES 2013. LAD. Continuing with dual antiplatelet therapy. Aggressive secondary prevention. Prior history of GI bleed while on the concomitant NSAIDs. No recent issues. 2. Hyperlipidemia-continue with Crestor. Previously had had issues with cost but this has been rectified by his insurance company. LDL goal is 70. Last LDL 94. Continue to encourage statin use. Exercise, diet. 3. History of bypass surgery-as above. Aggressive secondary prevention. 4. Orthostatic hypotension - stop metoprolol 12.5 mg. This is a very minor/low-dose, however hopefully this will help. He also has mild bradycardia. Counseled him on compression hose. Getting up slowly. Adequate hydration, sports drink. 5. Six-month followup  Signed, Candee Furbish, MD [place]  07/21/2013 8:38 AM

## 2013-07-21 NOTE — Patient Instructions (Signed)
Your physician has recommended you make the following change in your medication:   1. Stop Metoprolol  Your physician wants you to follow-up in: 6 months with Dr. Marlou Porch. You will receive a reminder letter in the mail two months in advance. If you don't receive a letter, please call our office to schedule the follow-up appointment.

## 2013-07-22 ENCOUNTER — Telehealth: Payer: Self-pay | Admitting: Cardiology

## 2013-07-22 NOTE — Telephone Encounter (Signed)
New message     Saw Dr  Lyndel Safe visit summary said to stop metoprolol---however, he was never on this medication.  Is he supposed to be on this medication?

## 2013-07-23 ENCOUNTER — Other Ambulatory Visit: Payer: Self-pay

## 2013-07-23 MED ORDER — CLOPIDOGREL BISULFATE 75 MG PO TABS: 75.0000 mg | ORAL_TABLET | Freq: Once | ORAL | Status: DC

## 2013-07-23 NOTE — Telephone Encounter (Signed)
Left message on voicemail to call the office

## 2013-07-23 NOTE — Telephone Encounter (Signed)
Spoke with patient advised that medication was on list, so Dr. Marlou Porch advised to stop, wife states patient was never on it, I advised to not worry about it.

## 2013-08-21 ENCOUNTER — Other Ambulatory Visit: Payer: Self-pay

## 2013-08-21 MED ORDER — ROSUVASTATIN CALCIUM 10 MG PO TABS: 10.0000 mg | ORAL_TABLET | Freq: Every day | ORAL | Status: DC

## 2013-10-14 ENCOUNTER — Ambulatory Visit
Admission: RE | Admit: 2013-10-14 | Discharge: 2013-10-14 | Disposition: A | Discharge status: Home / Self Care | Payer: Medicare Other | Source: Ambulatory Visit | Attending: Family Medicine | Admitting: Family Medicine

## 2013-10-14 ENCOUNTER — Other Ambulatory Visit: Payer: Self-pay | Admitting: Family Medicine

## 2013-10-14 DIAGNOSIS — R042 Hemoptysis: Secondary | ICD-10-CM

## 2013-10-17 ENCOUNTER — Other Ambulatory Visit (HOSPITAL_COMMUNITY): Payer: Self-pay

## 2013-10-17 DIAGNOSIS — J441 Chronic obstructive pulmonary disease with (acute) exacerbation: Secondary | ICD-10-CM

## 2013-10-23 ENCOUNTER — Ambulatory Visit (HOSPITAL_COMMUNITY)
Admission: RE | Admit: 2013-10-23 | Discharge: 2013-10-23 | Disposition: A | Discharge status: Home / Self Care | Payer: Medicare Other | Source: Ambulatory Visit | Attending: Family Medicine | Admitting: Family Medicine

## 2013-10-23 DIAGNOSIS — I2510 Atherosclerotic heart disease of native coronary artery without angina pectoris: Secondary | ICD-10-CM | POA: Insufficient documentation

## 2013-10-23 DIAGNOSIS — E785 Hyperlipidemia, unspecified: Secondary | ICD-10-CM | POA: Insufficient documentation

## 2013-10-23 DIAGNOSIS — J4489 Other specified chronic obstructive pulmonary disease: Secondary | ICD-10-CM | POA: Insufficient documentation

## 2013-10-23 DIAGNOSIS — Z951 Presence of aortocoronary bypass graft: Secondary | ICD-10-CM | POA: Insufficient documentation

## 2013-10-23 DIAGNOSIS — J449 Chronic obstructive pulmonary disease, unspecified: Secondary | ICD-10-CM | POA: Insufficient documentation

## 2013-10-23 MED ORDER — ALBUTEROL SULFATE (2.5 MG/3ML) 0.083% IN NEBU
2.5000 mg | INHALATION_SOLUTION | Freq: Once | RESPIRATORY_TRACT | Status: AC
  Administered 2013-10-23: 2.5 mg via RESPIRATORY_TRACT

## 2013-11-03 LAB — PULMONARY FUNCTION TEST
DL/VA % pred: 61 %
DL/VA: 2.6 ml/min/mmHg/L
DLCO COR % PRED: 43 %
DLCO COR: 11.07 ml/min/mmHg
DLCO UNC % PRED: 43 %
DLCO UNC: 11.07 ml/min/mmHg
FEF 25-75 POST: 1.08 L/s
FEF 25-75 Pre: 0.99 L/sec
FEF2575-%Change-Post: 9 %
FEF2575-%PRED-PRE: 61 %
FEF2575-%Pred-Post: 66 %
FEV1-%Change-Post: 5 %
FEV1-%PRED-PRE: 83 %
FEV1-%Pred-Post: 87 %
FEV1-POST: 2.04 L
FEV1-Pre: 1.94 L
FEV1FVC-%CHANGE-POST: 4 %
FEV1FVC-%Pred-Pre: 90 %
FEV6-%CHANGE-POST: 0 %
FEV6-%PRED-PRE: 96 %
FEV6-%Pred-Post: 97 %
FEV6-Post: 2.98 L
FEV6-Pre: 2.96 L
FEV6FVC-%Change-Post: 0 %
FEV6FVC-%PRED-POST: 108 %
FEV6FVC-%Pred-Pre: 107 %
FVC-%CHANGE-POST: 0 %
FVC-%PRED-POST: 90 %
FVC-%Pred-Pre: 90 %
FVC-Post: 2.99 L
FVC-Pre: 2.99 L
POST FEV1/FVC RATIO: 68 %
PRE FEV1/FVC RATIO: 65 %
Post FEV6/FVC ratio: 100 %
Pre FEV6/FVC Ratio: 99 %
RV % pred: 62 %
RV: 1.46 L
TLC % pred: 73 %
TLC: 4.44 L

## 2013-11-13 ENCOUNTER — Institutional Professional Consult (permissible substitution): Payer: Medicare Other | Admitting: Internal Medicine

## 2013-11-14 ENCOUNTER — Other Ambulatory Visit (INDEPENDENT_AMBULATORY_CARE_PROVIDER_SITE_OTHER): Payer: Medicare Other

## 2013-11-14 ENCOUNTER — Encounter: Payer: Self-pay | Admitting: Internal Medicine

## 2013-11-14 ENCOUNTER — Ambulatory Visit (INDEPENDENT_AMBULATORY_CARE_PROVIDER_SITE_OTHER): Payer: Medicare Other | Admitting: Internal Medicine

## 2013-11-14 VITALS — BP 122/62 | HR 70 | Temp 98.0°F | Ht 67.0 in | Wt 137.0 lb

## 2013-11-14 DIAGNOSIS — R0602 Shortness of breath: Secondary | ICD-10-CM

## 2013-11-14 DIAGNOSIS — J449 Chronic obstructive pulmonary disease, unspecified: Secondary | ICD-10-CM

## 2013-11-14 DIAGNOSIS — E039 Hypothyroidism, unspecified: Secondary | ICD-10-CM

## 2013-11-14 DIAGNOSIS — J4489 Other specified chronic obstructive pulmonary disease: Secondary | ICD-10-CM

## 2013-11-14 LAB — CBC WITH DIFFERENTIAL/PLATELET
Basophils Absolute: 0.1 10*3/uL (ref 0.0–0.1)
Basophils Relative: 1.2 % (ref 0.0–3.0)
Eosinophils Absolute: 0.2 10*3/uL (ref 0.0–0.7)
Eosinophils Relative: 3.2 % (ref 0.0–5.0)
HCT: 33.2 % — ABNORMAL LOW (ref 39.0–52.0)
Hemoglobin: 11.1 g/dL — ABNORMAL LOW (ref 13.0–17.0)
LYMPHS PCT: 16 % (ref 12.0–46.0)
Lymphs Abs: 1 10*3/uL (ref 0.7–4.0)
MCHC: 33.4 g/dL (ref 30.0–36.0)
MCV: 94.1 fl (ref 78.0–100.0)
MONOS PCT: 10.8 % (ref 3.0–12.0)
Monocytes Absolute: 0.7 10*3/uL (ref 0.1–1.0)
NEUTROS PCT: 68.8 % (ref 43.0–77.0)
Neutro Abs: 4.3 10*3/uL (ref 1.4–7.7)
PLATELETS: 215 10*3/uL (ref 150.0–400.0)
RBC: 3.52 Mil/uL — ABNORMAL LOW (ref 4.22–5.81)
RDW: 15.5 % (ref 11.5–15.5)
WBC: 6.3 10*3/uL (ref 4.0–10.5)

## 2013-11-14 LAB — BASIC METABOLIC PANEL
BUN: 14 mg/dL (ref 6–23)
CALCIUM: 9.6 mg/dL (ref 8.4–10.5)
CHLORIDE: 106 meq/L (ref 96–112)
CO2: 28 mEq/L (ref 19–32)
CREATININE: 0.9 mg/dL (ref 0.4–1.5)
GFR: 85.6 mL/min (ref >60.00)
Glucose, Bld: 91 mg/dL (ref 70–99)
Potassium: 4.6 mEq/L (ref 3.5–5.1)
Sodium: 141 mEq/L (ref 135–145)

## 2013-11-14 LAB — TSH: TSH: 7.97 u[IU]/mL — AB (ref 0.35–4.50)

## 2013-11-14 LAB — D-DIMER, QUANTITATIVE (NOT AT ARMC): D-Dimer, Quant: 0.84 ug/mL-FEU — ABNORMAL HIGH (ref 0.00–0.48)

## 2013-11-14 LAB — BRAIN NATRIURETIC PEPTIDE: Pro B Natriuretic peptide (BNP): 107 pg/mL — ABNORMAL HIGH (ref 0.0–100.0)

## 2013-11-14 NOTE — Progress Notes (Signed)
Quick Note:  LMTCB ______ 

## 2013-11-14 NOTE — Patient Instructions (Addendum)
Protonix should be  Take 30-60 min before first meal of the day   Please remember to go to the lab   department downstairs for your tests - we will call you with the results when they are available.  GERD (REFLUX)  is an extremely common cause of respiratory symptoms, many times with no significant heartburn at all.    It can be treated with medication, but also with lifestyle changes including avoidance of late meals, excessive alcohol, smoking cessation, and avoid fatty foods, chocolate, peppermint, colas, red wine, and acidic juices such as orange juice.  NO MINT OR MENTHOL PRODUCTS SO NO COUGH DROPS  USE SUGARLESS CANDY INSTEAD (jolley ranchers or Stover's)  NO OIL BASED VITAMINS - use powdered substitutes.    Please schedule a follow up office visit in 4 weeks, sooner if needed

## 2013-11-14 NOTE — Progress Notes (Signed)
Subjective:    Patient ID: [name], male    DOB: [birth year]  MRN: [id number]  HPI  79 yowm very athletic as child into adulthood despite smoking which quit 1990 with no breathing problems with onset of doe x winter 2015 referred pulmonary clinic 11/14/2013  by Dr Derrill Memo for eval.   11/14/2013 1st [facility] office visit/ [name]  Chief Complaint  Patient presents with  . Pulmonary Consult    Referred per Dr. Mayra Neer. Pt c/o SOB "for years". He gets SOB walking up stairs and when he gets in a hurry. He also c/o occ hemoptysis in the am.   onset of sob was indolent and min progressive,  Denies ever using inhalers. Cough started about the same time as sob, worse daytime than at hs or while sleeping but typically wakes up with traces of blood in am sputum on asa and plavix  No obvious other patterns in day to day or daytime variabilty or assoc chronic cough or cp or chest tightness, subjective wheeze overt sinus or hb symptoms. No unusual exp hx or h/o childhood pna/ asthma or knowledge of premature birth.  Sleeping ok without nocturnal  or early am exacerbation  of respiratory  c/o's or need for noct saba. Also denies any obvious fluctuation of symptoms with weather or environmental changes or other aggravating or alleviating factors except as outlined above   Current Medications, Allergies, Complete Past Medical History, Past Surgical History, Family History, and Social History were reviewed in [employer] record.             Review of Systems  Constitutional: Negative for fever, chills, activity change, appetite change and unexpected weight change.  HENT: Negative for congestion, dental problem, postnasal drip, rhinorrhea, sneezing, sore throat, trouble swallowing and voice change.   Eyes: Negative for visual disturbance.  Respiratory: Positive for cough and shortness of breath. Negative for choking.   Cardiovascular: Negative for chest pain  and leg swelling.  Gastrointestinal: Negative for nausea, vomiting and abdominal pain.  Genitourinary: Negative for difficulty urinating.  Musculoskeletal: Negative for arthralgias.  Skin: Negative for rash.  Psychiatric/Behavioral: Negative for behavioral problems and confusion.       Objective:   Physical Exam  Wt Readings from Last 3 Encounters:  11/14/13 137 lb (62.143 kg)  07/21/13 132 lb (59.875 kg)  02/08/12 146 lb 2.6 oz (66.3 kg)     amb mod hoarse wm nad HEENT mild turbinate edema.  Partial upper plate s  Oropharynx no thrush or excess pnd or cobblestoning.  No JVD or cervical adenopathy. Mild accessory muscle hypertrophy. Trachea midline, nl thryroid. Chest was hyperinflated by percussion with diminished breath sounds and moderate increased exp time without wheeze. Hoover sign positive at mid inspiration. Regular rate and rhythm without murmur gallop or rub or increase P2 or edema.  Abd: no hsm, nl excursion. Ext warm without cyanosis or clubbing.      cxr 10/14/13 copd/ scarring    Recent Labs Lab 11/14/13 1144  NA 141  K 4.6  CL 106  CO2 28  BUN 14  CREATININE 0.9  GLUCOSE 91    Recent Labs Lab 11/14/13 1144  HGB 11.1*  HCT 33.2*  WBC 6.3  PLT 215.0        Lab Results  Component Value Date   PROBNP 107.0* 11/14/2013    11/14/13 D Dimer 0.84   Lab Results  Component Value Date   TSH 7.97* 11/14/2013  Assessment & Plan:

## 2013-11-15 DIAGNOSIS — E039 Hypothyroidism, unspecified: Secondary | ICD-10-CM | POA: Insufficient documentation

## 2013-11-15 NOTE — Assessment & Plan Note (Addendum)
Only sign finding is mild anemia and high tsh (see hypothyroid) which usually doesn't cause sob   Since this is a chronic complaint will regroup in one month to consider starting lama at that point though the degree of airflow obst is really not all that impressive

## 2013-11-15 NOTE — Assessment & Plan Note (Addendum)
pfts 10/23/13  FEV1  1.94 (83%) ratio 65 and no better p saba and dlco 43%  - 11/14/2013  Walked RA  2 laps @ 185 ft each stopped due to sob, dizzy, but no desats   Symptoms are markedly disproportionate to objective findings and not clear this is all a  lung problem but pt does appear to have difficult airway management issues.   Adherence is always the initial "prime suspect" and is a multilayered concern that requires a "trust but verify" approach in every patient - starting with knowing how to use medications, especially inhalers, correctly, keeping up with refills and understanding the fundamental difference between maintenance and prns vs those medications only taken for a very short course and then stopped and not refilled.   ? Acid (or non-acid) GERD > always difficult to exclude as up to 75% of pts in some series report no assoc GI/ Heartburn symptoms> rec max (24h)  acid suppression and diet restrictions/ reviewed and instructions given in writing.   ? chf > excluded by bnp  Of 107

## 2013-11-15 NOTE — Assessment & Plan Note (Signed)
TSH  8 on 11/14/13 rec synthroid 25 mcg per day

## 2013-11-17 ENCOUNTER — Other Ambulatory Visit: Payer: Self-pay | Admitting: Internal Medicine

## 2013-11-17 MED ORDER — LEVOTHYROXINE SODIUM 25 MCG PO TABS: 25.0000 ug | ORAL_TABLET | Freq: Every day | ORAL | Status: DC

## 2013-11-17 NOTE — Progress Notes (Signed)
Quick Note:  Spoke with pt and notified of results per Dr. Wert. Pt verbalized understanding and denied any questions.  ______ 

## 2013-12-12 ENCOUNTER — Encounter: Payer: Self-pay | Admitting: Internal Medicine

## 2013-12-12 ENCOUNTER — Other Ambulatory Visit (INDEPENDENT_AMBULATORY_CARE_PROVIDER_SITE_OTHER): Payer: Medicare Other

## 2013-12-12 ENCOUNTER — Ambulatory Visit (INDEPENDENT_AMBULATORY_CARE_PROVIDER_SITE_OTHER): Payer: Medicare Other | Admitting: Internal Medicine

## 2013-12-12 VITALS — BP 112/58 | HR 60 | Temp 97.9°F | Ht 67.5 in | Wt 130.0 lb

## 2013-12-12 DIAGNOSIS — E039 Hypothyroidism, unspecified: Secondary | ICD-10-CM

## 2013-12-12 DIAGNOSIS — J449 Chronic obstructive pulmonary disease, unspecified: Secondary | ICD-10-CM

## 2013-12-12 LAB — TSH: TSH: 8.86 u[IU]/mL — AB (ref 0.35–4.50)

## 2013-12-12 NOTE — Progress Notes (Signed)
Subjective:    Patient ID: [name], male    DOB: Apr 01, 1934  MRN: [id number]    Brief patient profile:  51 yowm very athletic as child into adulthood despite smoking which quit 1990 with no breathing problems with onset of doe x winter 2015 referred pulmonary clinic 11/14/2013  by Dr Derrill Memo for eval.    History of Present Illness  11/14/2013 1st [facility] office visit/ [name]  Chief Complaint  Patient presents with  . Pulmonary Consult    Referred per Dr. Mayra Neer. Pt c/o SOB "for years". He gets SOB walking up stairs and when he gets in a hurry. He also c/o occ hemoptysis in the am.   onset of sob was indolent and min progressive,  Denies ever using inhalers. Cough started about the same time as sob, worse daytime than at hs or while sleeping but typically wakes up with traces of blood in am sputum on asa and plavix rec Protonix should be  Take 30-60 min before first meal of the day  GERD diet   12/12/2013 f/u ov/Ailed Defibaugh re: cough and sob ? Etiology  Chief Complaint  Patient presents with  . Follow-up    Breathing is unchanged since the last visit.  No new co's today.   doe x only on hills and if carry more than usual of bag of groceries Traces of hemoptysis = every couple of weeks at most  Denies lack of appetite but wt is down 16 lb x 2 years   No obvious day to day or daytime variabilty or assoc chronic cough or cp or chest tightness, subjective wheeze overt sinus or hb symptoms. No unusual exp hx or h/o childhood pna/ asthma or knowledge of premature birth.  Sleeping ok without nocturnal  or early am exacerbation  of respiratory  c/o's or need for noct saba. Also denies any obvious fluctuation of symptoms with weather or environmental changes or other aggravating or alleviating factors except as outlined above   Current Medications, Allergies, Complete Past Medical History, Past Surgical History, Family History, and Social History were reviewed in [employer] record.  ROS  The following are not active complaints unless bolded sore throat, dysphagia, dental problems, itching, sneezing,  nasal congestion or excess/ purulent secretions, ear ache,   fever, chills, sweats, unintended wt loss, pleuritic or exertional cp, hemoptysis,  orthopnea pnd or leg swelling, presyncope, palpitations, heartburn, abdominal pain, anorexia, nausea, vomiting, diarrhea  or change in bowel or urinary habits, change in stools or urine, dysuria,hematuria,  rash, arthralgias, visual complaints, headache, numbness weakness or ataxia or problems with walking or coordination,  change in mood/affect or memory.                        Objective:   Physical Exam  12/12/2013          130  Wt Readings from Last 3 Encounters:  11/14/13 137 lb (62.143 kg)  07/21/13 132 lb (59.875 kg)  02/08/12 146 lb 2.6 oz (66.3 kg)     amb min  hoarse wm nad  HEENT mild turbinate edema.  Partial upper plate s  Oropharynx no thrush or excess pnd or cobblestoning.  No JVD or cervical adenopathy. Mild accessory muscle hypertrophy. Trachea midline, nl thryroid. Chest was hyperinflated by percussion with diminished breath sounds and moderate increased exp time without wheeze. Hoover sign positive at mid inspiration. Regular rate and rhythm without murmur gallop or  rub or increase P2 or edema.  Abd: no hsm, nl excursion. Ext warm without cyanosis or clubbing.      cxr 10/14/13 copd/ scarring    Recent Labs Lab 11/14/13 1144  NA 141  K 4.6  CL 106  CO2 28  BUN 14  CREATININE 0.9  GLUCOSE 91    Recent Labs Lab 11/14/13 1144  HGB 11.1*  HCT 33.2*  WBC 6.3  PLT 215.0        Lab Results  Component Value Date   PROBNP 107.0* 11/14/2013    11/14/13 D Dimer 0.84   Lab Results  Component Value Date   TSH 7.97* 11/14/2013       Assessment & Plan:

## 2013-12-12 NOTE — Patient Instructions (Addendum)
Please remember to go to the lab  department downstairs for your tests - we will call you with the results when they are available.  Pulmonary follow up is as needed = only if you feel your breathing is holding you back and you want to try  medication for it

## 2013-12-14 ENCOUNTER — Encounter: Payer: Self-pay | Admitting: Internal Medicine

## 2013-12-14 NOTE — Assessment & Plan Note (Signed)
TSH  8 on 11/14/13 rec synthroid 25 mcg per day  >  Repeat level 12/12/13  8.9 so increased to 50 mcg per day and referred back to primary care  Concerned about wt loss in setting of hypothyroidism, a very unusual combo ? Underlying GI ca?

## 2013-12-14 NOTE — Assessment & Plan Note (Addendum)
pfts 10/23/13  FEV1  1.94 (83%) ratio 65 and no better p saba and dlco 43%  - 11/14/2013  Walked RA  2 laps @ 185 ft each stopped due to sob, dizzy, but no desats  - 12/12/2013  Walked RA x 3 laps @ 185 ft each stopped due to end of study, nl pace, no sob, no desats   As I explained to this patient in detail:  although there technically  may be copd present, it may not be clinically relevant:   it does not appear to be limiting activity tolerance any more than a set of worn tires limits someone from driving a car  around a parking lot.  A new set of Michelins might look good but would have no perceived impact on the performance of the car and would not be worth the cost.  That is to say:   this pt is so sedentary I don't recommend aggressive pulmonary rx at this point unless limiting symptoms arise or acute exacerbations become as issue, neither of which is the case now.  I asked the patient to contact this office at any time in the future should either of these problems arise.

## 2013-12-15 ENCOUNTER — Other Ambulatory Visit: Payer: Self-pay | Admitting: Internal Medicine

## 2013-12-15 NOTE — Progress Notes (Signed)
Quick Note:  LMTCB ______ 

## 2013-12-16 ENCOUNTER — Telehealth: Payer: Self-pay | Admitting: Internal Medicine

## 2013-12-16 MED ORDER — LEVOTHYROXINE SODIUM 25 MCG PO TABS: 50.0000 ug | ORAL_TABLET | Freq: Every day | ORAL | Status: DC

## 2013-12-16 NOTE — Telephone Encounter (Signed)
Result Note     Call patient : Study is c/w still low thyroid, needs to double synthroid, take 25 mcg x 2 each am and give new bottle of #60 when runs out but see primary w/in 6 weeks  ----  Spouse aware of results. Nothing further needed. RX sent in.

## 2013-12-17 ENCOUNTER — Other Ambulatory Visit: Payer: Self-pay | Admitting: Internal Medicine

## 2014-01-12 ENCOUNTER — Other Ambulatory Visit: Payer: Self-pay | Admitting: Internal Medicine

## 2014-02-02 ENCOUNTER — Encounter: Payer: Self-pay | Admitting: Cardiology

## 2014-03-10 ENCOUNTER — Other Ambulatory Visit: Payer: Self-pay | Admitting: Cardiology

## 2014-03-23 ENCOUNTER — Other Ambulatory Visit: Payer: Self-pay | Admitting: Cardiology

## 2014-04-09 ENCOUNTER — Other Ambulatory Visit: Payer: Self-pay | Admitting: Cardiology

## 2014-04-12 ENCOUNTER — Other Ambulatory Visit: Payer: Self-pay | Admitting: Cardiology

## 2014-04-16 ENCOUNTER — Encounter (HOSPITAL_COMMUNITY): Payer: Self-pay | Admitting: Interventional Cardiology

## 2014-04-18 ENCOUNTER — Other Ambulatory Visit: Payer: Self-pay | Admitting: Cardiology

## 2014-04-19 ENCOUNTER — Other Ambulatory Visit: Payer: Self-pay | Admitting: Cardiology

## 2014-05-13 ENCOUNTER — Other Ambulatory Visit: Payer: Self-pay | Admitting: Cardiology

## 2014-05-17 ENCOUNTER — Other Ambulatory Visit: Payer: Self-pay | Admitting: Cardiology

## 2014-06-02 ENCOUNTER — Other Ambulatory Visit: Payer: Self-pay | Admitting: Cardiology

## 2014-06-19 ENCOUNTER — Other Ambulatory Visit: Payer: Self-pay | Admitting: Cardiology

## 2014-06-22 ENCOUNTER — Telehealth: Payer: Self-pay | Admitting: Cardiology

## 2014-06-22 NOTE — Telephone Encounter (Signed)
New message      Refill crestor 10mg  to [facility] at [address]

## 2014-06-22 NOTE — Telephone Encounter (Signed)
Needs follow up appointment for refills. Annual appt. Due in March should be on recall list.

## 2014-06-23 ENCOUNTER — Other Ambulatory Visit: Payer: Self-pay

## 2014-06-23 MED ORDER — ROSUVASTATIN CALCIUM 10 MG PO TABS: 10.0000 mg | ORAL_TABLET | Freq: Every day | ORAL | Status: DC

## 2014-09-09 ENCOUNTER — Emergency Department (HOSPITAL_COMMUNITY): Payer: Medicare Other

## 2014-09-09 ENCOUNTER — Encounter (HOSPITAL_COMMUNITY): Payer: Self-pay | Admitting: Vascular Surgery

## 2014-09-09 ENCOUNTER — Observation Stay (HOSPITAL_COMMUNITY)
Admission: EM | Admit: 2014-09-09 | Discharge: 2014-09-10 | Disposition: A | Discharge status: Home / Self Care | Payer: Medicare Other | Attending: Cardiovascular Disease | Admitting: Cardiovascular Disease

## 2014-09-09 DIAGNOSIS — Z9861 Coronary angioplasty status: Secondary | ICD-10-CM

## 2014-09-09 DIAGNOSIS — I25110 Atherosclerotic heart disease of native coronary artery with unstable angina pectoris: Secondary | ICD-10-CM | POA: Diagnosis not present

## 2014-09-09 DIAGNOSIS — I2510 Atherosclerotic heart disease of native coronary artery without angina pectoris: Secondary | ICD-10-CM | POA: Diagnosis not present

## 2014-09-09 DIAGNOSIS — I951 Orthostatic hypotension: Secondary | ICD-10-CM | POA: Diagnosis not present

## 2014-09-09 DIAGNOSIS — E039 Hypothyroidism, unspecified: Secondary | ICD-10-CM | POA: Diagnosis not present

## 2014-09-09 DIAGNOSIS — Z8719 Personal history of other diseases of the digestive system: Secondary | ICD-10-CM

## 2014-09-09 DIAGNOSIS — R072 Precordial pain: Secondary | ICD-10-CM | POA: Diagnosis not present

## 2014-09-09 DIAGNOSIS — J449 Chronic obstructive pulmonary disease, unspecified: Secondary | ICD-10-CM | POA: Diagnosis present

## 2014-09-09 DIAGNOSIS — E785 Hyperlipidemia, unspecified: Secondary | ICD-10-CM | POA: Insufficient documentation

## 2014-09-09 DIAGNOSIS — R079 Chest pain, unspecified: Secondary | ICD-10-CM | POA: Diagnosis present

## 2014-09-09 DIAGNOSIS — Z951 Presence of aortocoronary bypass graft: Secondary | ICD-10-CM | POA: Insufficient documentation

## 2014-09-09 DIAGNOSIS — K219 Gastro-esophageal reflux disease without esophagitis: Secondary | ICD-10-CM | POA: Insufficient documentation

## 2014-09-09 HISTORY — DX: Disorder of thyroid, unspecified: E07.9

## 2014-09-09 HISTORY — DX: Hyperlipidemia, unspecified: E78.5

## 2014-09-09 LAB — CBC WITH DIFFERENTIAL/PLATELET
Basophils Absolute: 0 10*3/uL (ref 0.0–0.1)
Basophils Relative: 0 % (ref 0–1)
Eosinophils Absolute: 0.1 10*3/uL (ref 0.0–0.7)
Eosinophils Relative: 1 % (ref 0–5)
HCT: 34.2 % — ABNORMAL LOW (ref 39.0–52.0)
Hemoglobin: 11.2 g/dL — ABNORMAL LOW (ref 13.0–17.0)
LYMPHS ABS: 1 10*3/uL (ref 0.7–4.0)
LYMPHS PCT: 9 % — AB (ref 12–46)
MCH: 30.4 pg (ref 26.0–34.0)
MCHC: 32.7 g/dL (ref 30.0–36.0)
MCV: 92.9 fL (ref 78.0–100.0)
Monocytes Absolute: 1.4 10*3/uL — ABNORMAL HIGH (ref 0.1–1.0)
Monocytes Relative: 12 % (ref 3–12)
NEUTROS PCT: 78 % — AB (ref 43–77)
Neutro Abs: 8.9 10*3/uL — ABNORMAL HIGH (ref 1.7–7.7)
Platelets: 192 10*3/uL (ref 150–400)
RBC: 3.68 MIL/uL — AB (ref 4.22–5.81)
RDW: 14.4 % (ref 11.5–15.5)
WBC: 11.4 10*3/uL — AB (ref 4.0–10.5)

## 2014-09-09 LAB — I-STAT CHEM 8, ED
BUN: 16 mg/dL (ref 6–20)
CALCIUM ION: 1.22 mmol/L (ref 1.13–1.30)
CREATININE: 0.9 mg/dL (ref 0.61–1.24)
Chloride: 106 mmol/L (ref 101–111)
GLUCOSE: 91 mg/dL (ref 70–99)
HCT: 36 % — ABNORMAL LOW (ref 39.0–52.0)
HEMOGLOBIN: 12.2 g/dL — AB (ref 13.0–17.0)
POTASSIUM: 3.7 mmol/L (ref 3.5–5.1)
Sodium: 143 mmol/L (ref 135–145)
TCO2: 22 mmol/L (ref 0–100)

## 2014-09-09 LAB — CBC
HEMATOCRIT: 34.2 % — AB (ref 39.0–52.0)
Hemoglobin: 11 g/dL — ABNORMAL LOW (ref 13.0–17.0)
MCH: 29.9 pg (ref 26.0–34.0)
MCHC: 32.2 g/dL (ref 30.0–36.0)
MCV: 92.9 fL (ref 78.0–100.0)
Platelets: 204 10*3/uL (ref 150–400)
RBC: 3.68 MIL/uL — AB (ref 4.22–5.81)
RDW: 14.4 % (ref 11.5–15.5)
WBC: 10.8 10*3/uL — ABNORMAL HIGH (ref 4.0–10.5)

## 2014-09-09 LAB — PROTIME-INR
INR: 1.13 (ref 0.00–1.49)
Prothrombin Time: 14.6 seconds (ref 11.6–15.2)

## 2014-09-09 LAB — TSH: TSH: 0.077 u[IU]/mL — AB (ref 0.350–4.500)

## 2014-09-09 LAB — CREATININE, SERUM
CREATININE: 0.93 mg/dL (ref 0.61–1.24)
GFR calc Af Amer: >60 mL/min (ref >60)
GFR calc non Af Amer: >60 mL/min (ref >60)

## 2014-09-09 LAB — BRAIN NATRIURETIC PEPTIDE: B Natriuretic Peptide: 141.8 pg/mL — ABNORMAL HIGH (ref 0.0–100.0)

## 2014-09-09 LAB — TROPONIN I: Troponin I: <0.03 ng/mL (ref <0.031)

## 2014-09-09 LAB — I-STAT TROPONIN, ED: Troponin i, poc: 0 ng/mL (ref 0.00–0.08)

## 2014-09-09 MED ORDER — CLOPIDOGREL BISULFATE 75 MG PO TABS
75.0000 mg | ORAL_TABLET | Freq: Every day | ORAL | Status: DC
  Administered 2014-09-10: 75 mg via ORAL
  Filled 2014-09-09: qty 1

## 2014-09-09 MED ORDER — FERROUS SULFATE 325 (65 FE) MG PO TABS
650.0000 mg | ORAL_TABLET | Freq: Every day | ORAL | Status: DC
  Filled 2014-09-09: qty 2

## 2014-09-09 MED ORDER — HEPARIN SODIUM (PORCINE) 5000 UNIT/ML IJ SOLN
5000.0000 [IU] | Freq: Three times a day (TID) | INTRAMUSCULAR | Status: DC
Start: 2014-09-09 — End: 2014-09-10
  Administered 2014-09-10: 5000 [IU] via SUBCUTANEOUS
  Filled 2014-09-09 (×2): qty 1

## 2014-09-09 MED ORDER — MORPHINE SULFATE 4 MG/ML IJ SOLN: 4.0000 mg | INTRAMUSCULAR | Status: DC | PRN

## 2014-09-09 MED ORDER — SODIUM CHLORIDE 0.9 % IV SOLN: 250.0000 mL | INTRAVENOUS | Status: DC | PRN

## 2014-09-09 MED ORDER — ASPIRIN 81 MG PO CHEW: 81.0000 mg | CHEWABLE_TABLET | ORAL | Status: DC

## 2014-09-09 MED ORDER — LEVOTHYROXINE SODIUM 50 MCG PO TABS
50.0000 ug | ORAL_TABLET | Freq: Every day | ORAL | Status: DC
  Administered 2014-09-10: 50 ug via ORAL
  Filled 2014-09-09: qty 1

## 2014-09-09 MED ORDER — SODIUM CHLORIDE 0.9 % WEIGHT BASED INFUSION: 1.0000 mL/kg/h | INTRAVENOUS | Status: DC

## 2014-09-09 MED ORDER — ACETAMINOPHEN 325 MG PO TABS: 650.0000 mg | ORAL_TABLET | ORAL | Status: DC | PRN

## 2014-09-09 MED ORDER — ASPIRIN EC 81 MG PO TBEC
81.0000 mg | DELAYED_RELEASE_TABLET | Freq: Every day | ORAL | Status: DC
  Administered 2014-09-10: 81 mg via ORAL
  Filled 2014-09-09: qty 1

## 2014-09-09 MED ORDER — PANTOPRAZOLE SODIUM 40 MG PO TBEC
40.0000 mg | DELAYED_RELEASE_TABLET | Freq: Every day | ORAL | Status: DC
  Administered 2014-09-10: 40 mg via ORAL
  Filled 2014-09-09: qty 1

## 2014-09-09 MED ORDER — ROSUVASTATIN CALCIUM 10 MG PO TABS
10.0000 mg | ORAL_TABLET | Freq: Every day | ORAL | Status: DC
  Administered 2014-09-10: 10 mg via ORAL
  Filled 2014-09-09: qty 1

## 2014-09-09 MED ORDER — SODIUM CHLORIDE 0.9 % IJ SOLN: 3.0000 mL | INTRAMUSCULAR | Status: DC | PRN

## 2014-09-09 MED ORDER — ASPIRIN EC 81 MG PO TBEC: 81.0000 mg | DELAYED_RELEASE_TABLET | Freq: Every day | ORAL | Status: DC

## 2014-09-09 MED ORDER — NITROGLYCERIN 0.4 MG SL SUBL: 0.4000 mg | SUBLINGUAL_TABLET | SUBLINGUAL | Status: DC | PRN

## 2014-09-09 MED ORDER — SODIUM CHLORIDE 0.9 % WEIGHT BASED INFUSION
3.0000 mL/kg/h | INTRAVENOUS | Status: DC
  Administered 2014-09-10: 3 mL/kg/h via INTRAVENOUS

## 2014-09-09 MED ORDER — ASPIRIN 300 MG RE SUPP: 300.0000 mg | RECTAL | Status: DC

## 2014-09-09 MED ORDER — SODIUM CHLORIDE 0.9 % IJ SOLN: 3.0000 mL | Freq: Two times a day (BID) | INTRAMUSCULAR | Status: DC

## 2014-09-09 MED ORDER — ONDANSETRON HCL 4 MG/2ML IJ SOLN: 4.0000 mg | Freq: Four times a day (QID) | INTRAMUSCULAR | Status: DC | PRN

## 2014-09-09 MED ORDER — ASPIRIN 81 MG PO CHEW: 324.0000 mg | CHEWABLE_TABLET | ORAL | Status: DC

## 2014-09-09 MED ORDER — SODIUM CHLORIDE 0.9 % IJ SOLN
3.0000 mL | Freq: Two times a day (BID) | INTRAMUSCULAR | Status: DC
  Administered 2014-09-09: 3 mL via INTRAVENOUS

## 2014-09-09 NOTE — ED Provider Notes (Signed)
CSN: [id number]     Arrival date & time 09/09/14  1351 History   First MD Initiated Contact with Patient 09/09/14 1403     Chief Complaint  Patient presents with  . Chest Pain     HPI Pt was seen at 1410. Per EMS, pt and family, c/o gradual onset and persistence of slowly improving left sided chest "pain" that began today at 1100. Pt states he was "sitting and standing a lot at work" when his symptoms began. Describes his CP as "dull" and "aching" with radiation into his LUE. Has been associated with SOB and nausea. Pt took his own SL ntg x2 with partial relief of his symptoms. EMS gave ASA and SL ntg x2 with improvement of his CP. Denies vomiting/diarrhea, no abd pain, no back pain, no fevers, no cough, no palpitations.  Cards: Dr. Marlou Porch Past Medical History  Diagnosis Date  . Thyroid disease   . Coronary artery disease   . COPD (chronic obstructive pulmonary disease)   . Hyperlipidemia    Past Surgical History  Procedure Laterality Date  . Coronary artery bypass graft    . Arm surgery    . Cardiac catheterization      History  Substance Use Topics  . Smoking status: Former Smoker    Types: Cigarettes  . Smokeless tobacco: Never Used  . Alcohol Use: No    Review of Systems ROS: Statement: All systems negative except as marked or noted in the HPI; Constitutional: Negative for fever and chills. ; ; Eyes: Negative for eye pain, redness and discharge. ; ; ENMT: Negative for ear pain, hoarseness, nasal congestion, sinus pressure and sore throat. ; ; Cardiovascular: +CP, SOB. Negative for palpitations, diaphoresis, and peripheral edema. ; ; Respiratory: Negative for cough, wheezing and stridor. ; ; Gastrointestinal: +nausea. Negative for vomiting, diarrhea, abdominal pain, blood in stool, hematemesis, jaundice and rectal bleeding. . ; ; Genitourinary: Negative for dysuria, flank pain and hematuria. ; ; Musculoskeletal: Negative for back pain and neck pain. Negative for swelling and  trauma.; ; Skin: Negative for pruritus, rash, abrasions, blisters, bruising and skin lesion.; ; Neuro: Negative for headache, lightheadedness and neck stiffness. Negative for weakness, altered level of consciousness , altered mental status, extremity weakness, paresthesias, involuntary movement, seizure and syncope.      Allergies  Review of patient's allergies indicates not on file.  Home Medications   Prior to Admission medications   Not on File   BP 117/50 mmHg  Pulse 61  Temp(Src) 97.7 F (36.5 C) (Oral)  Resp 13  SpO2 93% Physical Exam  1415: Physical examination:  Nursing notes reviewed; Vital signs and O2 SAT reviewed;  Constitutional: Well developed, Well nourished, Well hydrated, In no acute distress; Head:  Normocephalic, atraumatic; Eyes: EOMI, PERRL, No scleral icterus; ENMT: Mouth and pharynx normal, Mucous membranes moist; Neck: Supple, Full range of motion, No lymphadenopathy; Cardiovascular: Regular rate and rhythm, No gallop; Respiratory: Breath sounds clear & equal bilaterally, No wheezes.  Speaking full sentences with ease, Normal respiratory effort/excursion; Chest: Nontender, Movement normal; Abdomen: Soft, Nontender, Nondistended, Normal bowel sounds; Genitourinary: No CVA tenderness; Extremities: Pulses normal, No tenderness, No edema, No calf edema or asymmetry.; Neuro: AA&Ox3, Major CN grossly intact.  Speech clear. No gross focal motor or sensory deficits in extremities.; Skin: Color normal, Warm, Dry.   ED Course  Procedures    EKG Interpretation   Date/Time:  Wednesday Sep 09 2014 14:01:01 EDT Ventricular Rate:  73 PR Interval:  207 QRS  Duration: 86 QT Interval:  395 QTC Calculation: 435 R Axis:   31 Text Interpretation:  Sinus rhythm Artifact No old tracing to compare  Confirmed by [facility]  MD, Nunzio Cory [phone]) on 09/09/2014 2:06:55 PM      MDM  MDM Reviewed: previous chart, nursing note and vitals Reviewed previous: labs and  ECG Interpretation: labs, ECG and x-ray      Results for orders placed or performed during the hospital encounter of 09/09/14  CBC with Differential  Result Value Ref Range   WBC 11.4 (H) 4.0 - 10.5 K/uL   RBC 3.68 (L) 4.22 - 5.81 MIL/uL   Hemoglobin 11.2 (L) 13.0 - 17.0 g/dL   HCT 34.2 (L) 39.0 - 52.0 %   MCV 92.9 78.0 - 100.0 fL   MCH 30.4 26.0 - 34.0 pg   MCHC 32.7 30.0 - 36.0 g/dL   RDW 14.4 11.5 - 15.5 %   Platelets 192 150 - 400 K/uL   Neutrophils Relative % 78 (H) 43 - 77 %   Neutro Abs 8.9 (H) 1.7 - 7.7 K/uL   Lymphocytes Relative 9 (L) 12 - 46 %   Lymphs Abs 1.0 0.7 - 4.0 K/uL   Monocytes Relative 12 3 - 12 %   Monocytes Absolute 1.4 (H) 0.1 - 1.0 K/uL   Eosinophils Relative 1 0 - 5 %   Eosinophils Absolute 0.1 0.0 - 0.7 K/uL   Basophils Relative 0 0 - 1 %   Basophils Absolute 0.0 0.0 - 0.1 K/uL  I-stat Chem 8, ED  Result Value Ref Range   Sodium 143 135 - 145 mmol/L   Potassium 3.7 3.5 - 5.1 mmol/L   Chloride 106 101 - 111 mmol/L   BUN 16 6 - 20 mg/dL   Creatinine, Ser 0.90 0.61 - 1.24 mg/dL   Glucose, Bld 91 70 - 99 mg/dL   Calcium, Ion 1.22 1.13 - 1.30 mmol/L   TCO2 22 0 - 100 mmol/L   Hemoglobin 12.2 (L) 13.0 - 17.0 g/dL   HCT 36.0 (L) 39.0 - 52.0 %  I-stat troponin, ED  Result Value Ref Range   Troponin i, poc 0.00 0.00 - 0.08 ng/mL   Comment 3           Dg Chest Port 1 View 09/09/2014   CLINICAL DATA:  Chest pain, shortness of breath, and left arm pain today. History of COPD.  EXAM: PORTABLE CHEST - 1 VIEW  COMPARISON:  None  FINDINGS: Sequelae of prior median sternotomy are identified. The cardiomediastinal silhouette is within normal limits. Thoracic aortic calcification is noted. Emphysematous changes are seen. Asymmetric interstitial opacities are present in the right lung apex, while mildly prominent interstitial markings in the lung bases are relatively symmetric. No segmental airspace consolidation, overt pulmonary edema, pleural effusion, or  pneumothorax is identified. Multiple small retained metallic fragments are noted at the proximal right humerus.  IMPRESSION: COPD. Asymmetric density in the right lung apex likely represents emphysematous changes and scarring, however comparison with any prior outside studies, if available, is suggested to confirm chronicity and stability.   Electronically Signed   By: Logan Bores   On: 09/09/2014 14:58    1620:  EKG without acute changes and troponin negative. Symptoms improving after ASA, SL ntg and IV morphine. Pt has significant hx ACS; will admit. T/C to Cardiology, case discussed, including:  HPI, pertinent PM/SHx, VS/PE, dx testing, ED course and treatment:  Agreeable to come to ED for evaluation for admission.  Francine Graven, DO 09/13/14 1456

## 2014-09-09 NOTE — ED Notes (Signed)
Pt reports to the ED for eval of acute onset of left sided CP that radiates down his left arm. The pain started at 1130 am. Associated symptoms include nausea, dizziness, and SOB. Denies any lightheadedness, diaphoresis, or active vomiting. He took 2 nitros prior to EMS arrival and had one en route. Pt reports significant relief. He reports a slight ache to his left chest and a 7/10 pain in his left arm. Pt had 324 of ASA PTA as well. Pt has hx of CABG and stent placement. He is currently taking Plavix. Cardiologist is Dr. Etter Sjogren. 12 lead en route unremarkable. VSS en route. Pt A&Ox4, resp e/u, and skin warm and dry.

## 2014-09-09 NOTE — ED Notes (Signed)
Cardiology at bedside.

## 2014-09-09 NOTE — H&P (Signed)
Patient ID: Zimere Dunlevy MRN: [id number], DOB/AGE: May 22, 1933   Admit date: 09/09/2014   Primary Physician: No primary care provider on file. Primary Cardiologist: Dr. Marlou Porch   Pt. Profile:  [name] is a 81 y.o. male with a history of COPD, CAD s/p CABG (1987); BMS to LAD (2005); DES to LAD 2013 for ISR, COPD, HLD and hypothyroidism who presented to [facility] ED today with chest pain.  He was seen by Dr. Marlou Porch in 07/2013 and felt to be doing well from a cardiac standpoint at that time. His metoprolol was discontinued due to orthostatic hypotension. He also has a history of GI bleed requiring transfusion while on DAPT and NSAIDS. Upper GI/endoscopy demonstrated small AVM. He stopped NSAIDS and has had no further problems.   He was in his usual state of health until earlier today when he had sudden onset and persistence of left sided chest pain.. Pt states he was "sitting and standing a lot at work" when his symptoms began. Describes his CP as "dull" and "aching" with radiation into his LUE. Has been associated with SOB and nausea. Pt took his own SL ntg x2 with partial relief of his symptoms. EMS gave ASA and SL ntg x2 with improvement of his CP. He currently has no chest pain. He cannot remember previous cardiac chest pain to compare. He has been complaint with ASA and plavix and all other medications.   ALL CATH REPORTS COPIED BELOW   Problem List  Past Medical History  Diagnosis Date  . Thyroid disease   . Coronary artery disease   . COPD (chronic obstructive pulmonary disease)   . Hyperlipidemia     Past Surgical History  Procedure Laterality Date  . Coronary artery bypass graft    . Cardiac catheterization    . Cardiac catheterization    . Arm surgery       Allergies  Allergies not on file   Home Medications  Prior to Admission medications   Not on File    Family History  No family history on file. No family status information on file.     Social  History  History   Social History  . Marital Status: N/A    Spouse Name: N/A  . Number of Children: N/A  . Years of Education: N/A   Occupational History  . Not on file.   Social History Main Topics  . Smoking status: Former Smoker    Types: Cigarettes  . Smokeless tobacco: Never Used  . Alcohol Use: No  . Drug Use: No  . Sexual Activity: Not on file   Other Topics Concern  . Not on file   Social History Narrative  . No narrative on file      All other systems reviewed and are otherwise negative except as noted above.  Physical Exam  Blood pressure 117/50, pulse 61, temperature 97.7 F (36.5 C), temperature source Oral, resp. rate 13, SpO2 93 %.  General: Pleasant, NAD Psych: Normal affect. Neuro: Alert and oriented X 3. Moves all extremities spontaneously. HEENT: Normal  Neck: Supple without bruits or JVD. Lungs:  Resp regular and unlabored, CTA. Heart: RRR no s3, s4, or murmurs. Abdomen: Soft, non-tender, non-distended, BS + x 4.  Extremities: No clubbing, cyanosis or edema. DP/PT/Radials 2+ and equal bilaterally.  Labs  No results for input(s): CKTOTAL, CKMB, TROPONINI in the last 72 hours. Lab Results  Component Value Date   WBC 11.4* 09/09/2014   HGB 12.2* 09/09/2014  HCT 36.0* 09/09/2014   MCV 92.9 09/09/2014   PLT 192 09/09/2014    Recent Labs Lab 09/09/14 1619  NA 143  K 3.7  CL 106  BUN 16  CREATININE 0.90  GLUCOSE 91    Radiology/Studies  Dg Chest Port 1 View  09/09/2014   CLINICAL DATA:  Chest pain, shortness of breath, and left arm pain today. History of COPD.  EXAM: PORTABLE CHEST - 1 VIEW  COMPARISON:  None  FINDINGS: Sequelae of prior median sternotomy are identified. The cardiomediastinal silhouette is within normal limits. Thoracic aortic calcification is noted. Emphysematous changes are seen. Asymmetric interstitial opacities are present in the right lung apex, while mildly prominent interstitial markings in the lung bases are  relatively symmetric. No segmental airspace consolidation, overt pulmonary edema, pleural effusion, or pneumothorax is identified. Multiple small retained metallic fragments are noted at the proximal right humerus.  IMPRESSION: COPD. Asymmetric density in the right lung apex likely represents emphysematous changes and scarring, however comparison with any prior outside studies, if available, is suggested to confirm chronicity and stability.   Electronically Signed   By: Logan Bores   On: 09/09/2014 14:58    2005 FINAL DIAGNOSES: 1. Severe stenosis in the distal left anterior descending coronary artery,  95%, distal to the prior drug-eluting stents placed on June 08, 2003. 2. Normal circumflex and proximal left anterior descending coronary artery. 3. Normal appearance of the mid-left anterior descending coronary artery  within the drug-eluting stents. 4. Normal left ventricular function. 5. Normal right coronary artery vein graft, unchanged from prior study. 6. Successful angioplasty with percutaneous transluminal coronary  angioplasty and bare metal stent placement in the distal left anterior  descending coronary artery. 7. Successful Angioseal of the right femoral artery.   01/01/12  CARDIAC CATHETERIZATION  PROCEDURE: Left heart catheterization with selective coronary angiography, left ventriculogram.  INDICATIONS: 66 her old male with coronary artery disease status post single-vessel bypass in 1987, SVG to RCA which remains patent with subsequent percutaneous intervention x2 to the LAD mid to distal here with abnormal nuclear stress test, increasing dyspnea .  The risks, benefits, and details of the procedure were explained to the patient. The patient verbalized understanding and wanted to proceed. Informed written consent was obtained.  PROCEDURE TECHNIQUE: After Xylocaine anesthesia and visualization of the femoral head via fluoroscopy, a 62F sheath was  placed in the right femoral artery with a single anterior needle wall stick. Excellent blood flow was demonstrated however the wire would not adequately pass into the lumen of the femoral artery. Fluoroscopy was obtained and demonstrated a heavily calcified segment. A Versicor wire was then used but once again would not successfully pass. The needle was then removed and pressure held. A second attempt was made slightly inferior to the previous needle stick and was successful. Left coronary angiography was done using a Judkins L4 catheter. Right coronary angiography was done using a Judkins R4 catheter. SVG graft was selected with right coronary artery bypass graft catheter. Left ventriculography was done using a pigtail catheter.   CONTRAST: Total of 100 ml.  FLOUROSCOPY TIME: 5.8 minutes.   COMPLICATIONS: None.   HEMODYNAMICS: Aortic pressure was 269/48/54 mmHg; LV systolic pressure was 627 mmHg; LVEDP 16 mmHg. There was no gradient between the left ventricle and aorta.   ANGIOGRAPHIC DATA:   Left main: Heavily calcified distal left main/proximal LAD. The left main tapers in the LAO caudal view and constitutes a 40-50% stenosis.  Left anterior descending (LAD): The  LAD is heavily calcified throughout the vessel especially in its proximal/mid segment. The 2 previously placed stents are patent however just proximal to the proximal stent there is a 99% stenosis. The LAD is rather small in caliber.  Circumflex artery (CIRC): There are 4 small to moderate size obtuse marginal branches with minor luminal irregularities.  Right coronary artery (RCA): Proximal occlusion, no right to right collaterals.  Bypass graft: SVG to RCA remains widely patent. The RCA backfills to the midsegment. At the anastomosis site of the PDA there is mild narrowing but overall the graft supplies a large territory of the inferior wall.  LEFT VENTRICULOGRAM: Left ventricular angiogram was done in the 30 RAO  projection and revealed normal left ventricular wall motion and systolic function with an estimated ejection fraction of 60 %. The aortic arch is diffusely calcified. The descending aorta is also calcified but no obvious aneurysmal segments are noted. The common femoral artery is calcified.  IMPRESSIONS:  1. 40-50% distal left main, heavily calcified LAD with 99% stenosis just proximal to the previously placed patent stents. RCA is 100% occluded proximally. SVG graft is widely patent to the RCA territory.  2. Normal left ventricular systolic function. LVEDP 16 mmHg. Ejection fraction 60%.  RECOMMENDATION: I will discuss with interventional cardiology, given the diffuse calcified nature of the LAD a planned procedure i.e. possibility of rotablation must be considered. I discussed with patient and family.   LHC  01/04/12  INDICATION: Class III angina with early positive exercise treadmill test, and high-grade stenosis (ISR) mid LAD bare-metal stents. PROCEDURE: 1. Saphenous vein graft angiography with FFR determination at distal anastomosis stenosis. 2. Drug-eluting stent implantation mid LAD. ANGIOGRAPHIC RESULTS: 1. Eccentric 50-70% stenosis in the antegrade limb of the PDA anastomosis demonstrated to be hemodynamically insignificant by FFR of 0.9. 2. Successful DES implantation in the mid LAD for restenosis with reduction and 95% stenosis to 0% with TIMI grade 3 flow. IMPRESSIONS: 1. Successful DES implantation in the mid LAD for in-stent restenosis reducing a greater than 95% stenosis to 0% with TIMI grade 3 flow. 2. No evidence of inducible ischemia in the right coronary territory based upon FFR of 0.    ECG NSR HR 73   ASSESSMENT AND PLAN  [name] is a 81 y.o. male with a history of COPD, CAD s/p CABG (1987); BMS to LAD (2005); DES to LAD 2013 for ISR, COPD, HLD and hypothyroidism who presented to [facility] ED today with chest pain.  Chest pain- typical. Worrisome story in  the setting of previous CABG and multiple stents.  -- Troponin neg x1. ECG with no acute ST or TW changes -- Continue to cycle enzymes and serial ECG. No heparin unless enzymes return positive.  -- Will plan on LHC tomorrow. NPO after midnight.  -- Continue ASA, plavix and statin. Intolerant to BB due to orthostatic hypotension  HLD- continue crestor  Hypothyroidism- continue synthroid  GERD- continue protonix   Signed, Eileen Stanford, PA-C 09/09/2014, 4:22 PM  Pager [phone]  I have personally seen and examined this patient with Angelena Form, PA-C. I agree with the assessment and plan as outlined above. He has known CAD with prior PCI/stents. Presenting with classic symptoms of unstable angina. Troponin is negative. EKG without ischemic changes. Cardiac cath is indicated. Will arrange cath for tomorrow morning with DR. [place]. NPO at midnight.   [name] 09/09/2014 5:15 PM

## 2014-09-10 ENCOUNTER — Encounter (HOSPITAL_COMMUNITY)
Admission: EM | Disposition: A | Discharge status: Home / Self Care | Payer: Medicare Other | Source: Home / Self Care | Attending: Emergency Medicine

## 2014-09-10 ENCOUNTER — Encounter (HOSPITAL_COMMUNITY): Payer: Self-pay | Admitting: Cardiology

## 2014-09-10 DIAGNOSIS — R079 Chest pain, unspecified: Secondary | ICD-10-CM

## 2014-09-10 DIAGNOSIS — Z9861 Coronary angioplasty status: Secondary | ICD-10-CM

## 2014-09-10 DIAGNOSIS — I2510 Atherosclerotic heart disease of native coronary artery without angina pectoris: Secondary | ICD-10-CM

## 2014-09-10 DIAGNOSIS — E785 Hyperlipidemia, unspecified: Secondary | ICD-10-CM | POA: Diagnosis present

## 2014-09-10 DIAGNOSIS — J449 Chronic obstructive pulmonary disease, unspecified: Secondary | ICD-10-CM | POA: Diagnosis not present

## 2014-09-10 DIAGNOSIS — R072 Precordial pain: Secondary | ICD-10-CM | POA: Diagnosis not present

## 2014-09-10 DIAGNOSIS — Z951 Presence of aortocoronary bypass graft: Secondary | ICD-10-CM

## 2014-09-10 DIAGNOSIS — Z8719 Personal history of other diseases of the digestive system: Secondary | ICD-10-CM

## 2014-09-10 HISTORY — PX: CARDIAC CATHETERIZATION: SHX172

## 2014-09-10 LAB — LIPID PANEL
CHOLESTEROL: 106 mg/dL (ref 0–200)
HDL: 43 mg/dL (ref >40)
LDL Cholesterol: 55 mg/dL (ref 0–99)
TRIGLYCERIDES: 39 mg/dL (ref <150)
Total CHOL/HDL Ratio: 2.5 RATIO
VLDL: 8 mg/dL (ref 0–40)

## 2014-09-10 LAB — TROPONIN I: Troponin I: <0.03 ng/mL (ref <0.031)

## 2014-09-10 LAB — BASIC METABOLIC PANEL
ANION GAP: 7 (ref 5–15)
BUN: 11 mg/dL (ref 6–20)
CHLORIDE: 108 mmol/L (ref 101–111)
CO2: 24 mmol/L (ref 22–32)
Calcium: 8.5 mg/dL — ABNORMAL LOW (ref 8.9–10.3)
Creatinine, Ser: 0.82 mg/dL (ref 0.61–1.24)
GFR calc non Af Amer: >60 mL/min (ref >60)
Glucose, Bld: 103 mg/dL — ABNORMAL HIGH (ref 70–99)
Potassium: 4 mmol/L (ref 3.5–5.1)
Sodium: 139 mmol/L (ref 135–145)

## 2014-09-10 LAB — CBC
HCT: 34.4 % — ABNORMAL LOW (ref 39.0–52.0)
Hemoglobin: 11.1 g/dL — ABNORMAL LOW (ref 13.0–17.0)
MCH: 30.2 pg (ref 26.0–34.0)
MCHC: 32.3 g/dL (ref 30.0–36.0)
MCV: 93.5 fL (ref 78.0–100.0)
Platelets: 202 10*3/uL (ref 150–400)
RBC: 3.68 MIL/uL — ABNORMAL LOW (ref 4.22–5.81)
RDW: 14.5 % (ref 11.5–15.5)
WBC: 8.7 10*3/uL (ref 4.0–10.5)

## 2014-09-10 LAB — PROTIME-INR
INR: 1.2 (ref 0.00–1.49)
PROTHROMBIN TIME: 15.3 s — AB (ref 11.6–15.2)

## 2014-09-10 SURGERY — LEFT HEART CATH AND CORS/GRAFTS ANGIOGRAPHY
Anesthesia: LOCAL

## 2014-09-10 MED ORDER — FENTANYL CITRATE (PF) 100 MCG/2ML IJ SOLN
INTRAMUSCULAR | Status: AC
  Filled 2014-09-10: qty 2

## 2014-09-10 MED ORDER — MIDAZOLAM HCL 2 MG/2ML IJ SOLN
INTRAMUSCULAR | Status: DC | PRN
  Administered 2014-09-10: 1 mg via INTRAVENOUS

## 2014-09-10 MED ORDER — HEPARIN (PORCINE) IN NACL 2-0.9 UNIT/ML-% IJ SOLN
INTRAMUSCULAR | Status: AC
  Filled 2014-09-10: qty 500

## 2014-09-10 MED ORDER — SODIUM CHLORIDE 0.9 % IV SOLN: 250.0000 mL | INTRAVENOUS | Status: DC | PRN

## 2014-09-10 MED ORDER — IOHEXOL 350 MG/ML SOLN
INTRAVENOUS | Status: DC | PRN
  Administered 2014-09-10: 80 mL via INTRAVENOUS

## 2014-09-10 MED ORDER — HEPARIN SODIUM (PORCINE) 1000 UNIT/ML IJ SOLN
INTRAMUSCULAR | Status: AC
  Filled 2014-09-10: qty 1

## 2014-09-10 MED ORDER — SODIUM CHLORIDE 0.9 % WEIGHT BASED INFUSION: 3.0000 mL/kg/h | INTRAVENOUS | Status: AC

## 2014-09-10 MED ORDER — LIDOCAINE HCL (PF) 1 % IJ SOLN
INTRAMUSCULAR | Status: AC
  Filled 2014-09-10: qty 30

## 2014-09-10 MED ORDER — FENTANYL CITRATE (PF) 100 MCG/2ML IJ SOLN
INTRAMUSCULAR | Status: DC | PRN
  Administered 2014-09-10: 25 ug via INTRAVENOUS

## 2014-09-10 MED ORDER — MIDAZOLAM HCL 2 MG/2ML IJ SOLN
INTRAMUSCULAR | Status: AC
  Filled 2014-09-10: qty 2

## 2014-09-10 MED ORDER — SODIUM CHLORIDE 0.9 % IJ SOLN: 3.0000 mL | INTRAMUSCULAR | Status: DC | PRN

## 2014-09-10 MED ORDER — VERAPAMIL HCL 2.5 MG/ML IV SOLN
INTRAVENOUS | Status: DC | PRN
  Administered 2014-09-10: 08:00:00 via INTRA_ARTERIAL

## 2014-09-10 MED ORDER — HEPARIN SODIUM (PORCINE) 1000 UNIT/ML IJ SOLN
INTRAMUSCULAR | Status: DC | PRN
  Administered 2014-09-10: 4000 [IU] via INTRAVENOUS

## 2014-09-10 MED ORDER — ACETAMINOPHEN 325 MG PO TABS: 650.0000 mg | ORAL_TABLET | ORAL | Status: AC | PRN

## 2014-09-10 MED ORDER — SODIUM CHLORIDE 0.9 % IJ SOLN: 3.0000 mL | Freq: Two times a day (BID) | INTRAMUSCULAR | Status: DC

## 2014-09-10 MED ORDER — HEPARIN (PORCINE) IN NACL 2-0.9 UNIT/ML-% IJ SOLN
INTRAMUSCULAR | Status: AC
  Filled 2014-09-10: qty 1000

## 2014-09-10 SURGICAL SUPPLY — 15 items
CATH INFINITI 5 FR JL3.5 (CATHETERS) ×2 IMPLANT
CATH INFINITI 5FR ANG PIGTAIL (CATHETERS) ×2 IMPLANT
CATH INFINITI 5FR MULTPACK ANG (CATHETERS) IMPLANT
CATH INFINITI JR4 5F (CATHETERS) ×2 IMPLANT
DEVICE RAD COMP TR BAND LRG (VASCULAR PRODUCTS) ×2 IMPLANT
GLIDESHEATH SLEND SS 6F .021 (SHEATH) ×2 IMPLANT
KIT HEART LEFT (KITS) ×2 IMPLANT
PACK CARDIAC CATHETERIZATION (CUSTOM PROCEDURE TRAY) ×2 IMPLANT
SHEATH PINNACLE 5F 10CM (SHEATH) IMPLANT
SYR MEDRAD MARK V 150ML (SYRINGE) ×2 IMPLANT
TRANSDUCER W/STOPCOCK (MISCELLANEOUS) ×2 IMPLANT
TUBING CIL FLEX 10 FLL-RA (TUBING) ×2 IMPLANT
WIRE EMERALD 3MM-J .035X150CM (WIRE) IMPLANT
WIRE HI TORQ VERSACORE-J 145CM (WIRE) ×2 IMPLANT
WIRE SAFE-T 1.5MM-J .035X260CM (WIRE) ×2 IMPLANT

## 2014-09-10 NOTE — Discharge Instructions (Signed)
Chest Pain (Nonspecific) °It is often hard to give a specific diagnosis for the cause of chest pain. There is always a chance that your pain could be related to something serious, such as a heart attack or a blood clot in the lungs. You need to follow up with your health care provider for further evaluation. °CAUSES  °· Heartburn. °· Pneumonia or bronchitis. °· Anxiety or stress. °· Inflammation around your heart (pericarditis) or lung (pleuritis or pleurisy). °· A blood clot in the lung. °· A collapsed lung (pneumothorax). It can develop suddenly on its own (spontaneous pneumothorax) or from trauma to the chest. °· Shingles infection (herpes zoster virus). °The chest wall is composed of bones, muscles, and cartilage. Any of these can be the source of the pain. °· The bones can be bruised by injury. °· The muscles or cartilage can be strained by coughing or overwork. °· The cartilage can be affected by inflammation and become sore (costochondritis). °DIAGNOSIS  °Lab tests or other studies may be needed to find the cause of your pain. Your health care provider may have you take a test called an ambulatory electrocardiogram (ECG). An ECG records your heartbeat patterns over a 24-hour period. You may also have other tests, such as: °· Transthoracic echocardiogram (TTE). During echocardiography, sound waves are used to evaluate how blood flows through your heart. °· Transesophageal echocardiogram (TEE). °· Cardiac monitoring. This allows your health care provider to monitor your heart rate and rhythm in real time. °· Holter monitor. This is a portable device that records your heartbeat and can help diagnose heart arrhythmias. It allows your health care provider to track your heart activity for several days, if needed. °· Stress tests by exercise or by giving medicine that makes the heart beat faster. °TREATMENT  °· Treatment depends on what may be causing your chest pain. Treatment may include: °¨ Acid blockers for  heartburn. °¨ Anti-inflammatory medicine. °¨ Pain medicine for inflammatory conditions. °¨ Antibiotics if an infection is present. °· You may be advised to change lifestyle habits. This includes stopping smoking and avoiding alcohol, caffeine, and chocolate. °· You may be advised to keep your head raised (elevated) when sleeping. This reduces the chance of acid going backward from your stomach into your esophagus. °Most of the time, nonspecific chest pain will improve within 2-3 days with rest and mild pain medicine.  °HOME CARE INSTRUCTIONS  °· If antibiotics were prescribed, take them as directed. Finish them even if you start to feel better. °· For the next few days, avoid physical activities that bring on chest pain. Continue physical activities as directed. °· Do not use any tobacco products, including cigarettes, chewing tobacco, or electronic cigarettes. °· Avoid drinking alcohol. °· Only take medicine as directed by your health care provider. °· Follow your health care provider's suggestions for further testing if your chest pain does not go away. °· Keep any follow-up appointments you made. If you do not go to an appointment, you could develop lasting (chronic) problems with pain. If there is any problem keeping an appointment, call to reschedule. °SEEK MEDICAL CARE IF:  °· Your chest pain does not go away, even after treatment. °· You have a rash with blisters on your chest. °· You have a fever. °SEEK IMMEDIATE MEDICAL CARE IF:  °· You have increased chest pain or pain that spreads to your arm, neck, jaw, back, or abdomen. °· You have shortness of breath. °· You have an increasing cough, or you cough   up blood.  You have severe back or abdominal pain.  You feel nauseous or vomit.  You have severe weakness.  You faint.  You have chills. This is an emergency. Do not wait to see if the pain will go away. Get medical help at once. Call your local emergency services (911 in [place] Do not drive  yourself to the hospital. MAKE SURE YOU:   Understand these instructions.  Will watch your condition.  Will get help right away if you are not doing well or get worse. Document Released: 02/01/2005 Document Revised: 04/29/2013 Document Reviewed: 11/28/2007 [facility] Patient Information 2015 [place], [place]. This information is not intended to replace advice given to you by your health care provider. Make sure you discuss any questions you have with your health care provider. Radial Site Care Refer to this sheet in the next few weeks. These instructions provide you with information on caring for yourself after your procedure. Your caregiver may also give you more specific instructions. Your treatment has been planned according to current medical practices, but problems sometimes occur. Call your caregiver if you have any problems or questions after your procedure. HOME CARE INSTRUCTIONS  You may shower the day after the procedure.Remove the bandage (dressing) and gently wash the site with plain soap and water.Gently pat the site dry.  Do not apply powder or lotion to the site.  Do not submerge the affected site in water for 3 to 5 days.  Inspect the site at least twice daily.  Do not flex or bend the affected arm for 24 hours.  No lifting over 5 pounds (2.3 kg) for 5 days after your procedure.  Do not drive home if you are discharged the same day of the procedure. Have someone else drive you.  You may drive 24 hours after the procedure unless otherwise instructed by your caregiver.  Do not operate machinery or power tools for 24 hours.  A responsible adult should be with you for the first 24 hours after you arrive home. What to expect:  Any bruising will usually fade within 1 to 2 weeks.  Blood that collects in the tissue (hematoma) may be painful to the touch. It should usually decrease in size and tenderness within 1 to 2 weeks. SEEK IMMEDIATE MEDICAL CARE IF:  You have  unusual pain at the radial site.  You have redness, warmth, swelling, or pain at the radial site.  You have drainage (other than a small amount of blood on the dressing).  You have chills.  You have a fever or persistent symptoms for more than 72 hours.  You have a fever and your symptoms suddenly get worse.  Your arm becomes pale, cool, tingly, or numb.  You have heavy bleeding from the site. Hold pressure on the site. Document Released: 05/27/2010 Document Revised: 07/17/2011 Document Reviewed: 05/27/2010 [facility] Patient Information 2015 [place], [place]. This information is not intended to replace advice given to you by your health care provider. Make sure you discuss any questions you have with your health care provider.

## 2014-09-10 NOTE — Discharge Summary (Signed)
Patient ID: [name],  MRN: [id number], DOB/AGE: 1933/09/26 81 y.o.  Admit date: 09/09/2014 Discharge date: 09/10/2014  Primary Care Provider: No primary care provider on file. Primary Cardiologist: Dr Marlou Porch  Discharge Diagnoses Principal Problem:   Chest pain with moderate risk of acute coronary syndrome Active Problems:   S/P CABG 1987   CAD -S/P LAD BMS '05, LAD DES 2013, cath OK 09/10/14   COPD (chronic obstructive pulmonary disease)   Dyslipidemia   H/O of GI bleed secondary to NSAIDs and DAPT   Chest pain    Procedures: Coronary angiogram 09/10/14   Hospital Course:  81 y.o. male with a history of COPD, CAD s/p CABG (1987); BMS to LAD (2005); DES to LAD 2013 for ISR, HLD, and hypothyroidism who presented to [facility] ED 09/09/14 with chest pain concerning for unstable angina. He was seen by Dr. Marlou Porch in March 2015 and felt to be doing well from a cardiac standpoint at that time. His metoprolol was discontinued due to orthostatic hypotension. He also has a history of GI bleed requiring transfusion while on DAPT and NSAIDS. Upper GI/endoscopy demonstrated small AVM. He stopped NSAIDS and has had no further problems.            The pt was admitted and ruled out for an MI. He underwent coronary angiogram 09/10/14 and this revealed patent LAD stens and patent SVG-PDA. He has moderate disease in the remaining native arteries and normal LVF. He is felt to be stable for discharge later on the 5 th. A PCXR done this admission was abnormal and noted a Rt apical density. I reviewed previous CXR done June 2015 (the pt has two chart numbers) and this also showed emphysematous changes in the Rt apex. He will follow up with an APP in a few weeks, Dr Marlou Porch in 3 months.   Discharge Vitals:  Blood pressure 112/67, pulse 0, temperature 99.2 F (37.3 C), temperature source Oral, resp. rate 11, height '5\' 6"'$  (1.676 m), weight 137 lb 6.4 oz (62.324 kg), SpO2 0 %.    Labs: Results for orders placed or  performed during the hospital encounter of 09/09/14 (from the past 24 hour(s))  CBC with Differential     Status: Abnormal   Collection Time: 09/09/14  3:48 PM  Result Value Ref Range   WBC 11.4 (H) 4.0 - 10.5 K/uL   RBC 3.68 (L) 4.22 - 5.81 MIL/uL   Hemoglobin 11.2 (L) 13.0 - 17.0 g/dL   HCT 34.2 (L) 39.0 - 52.0 %   MCV 92.9 78.0 - 100.0 fL   MCH 30.4 26.0 - 34.0 pg   MCHC 32.7 30.0 - 36.0 g/dL   RDW 14.4 11.5 - 15.5 %   Platelets 192 150 - 400 K/uL   Neutrophils Relative % 78 (H) 43 - 77 %   Neutro Abs 8.9 (H) 1.7 - 7.7 K/uL   Lymphocytes Relative 9 (L) 12 - 46 %   Lymphs Abs 1.0 0.7 - 4.0 K/uL   Monocytes Relative 12 3 - 12 %   Monocytes Absolute 1.4 (H) 0.1 - 1.0 K/uL   Eosinophils Relative 1 0 - 5 %   Eosinophils Absolute 0.1 0.0 - 0.7 K/uL   Basophils Relative 0 0 - 1 %   Basophils Absolute 0.0 0.0 - 0.1 K/uL  I-stat troponin, ED     Status: None   Collection Time: 09/09/14  3:55 PM  Result Value Ref Range   Troponin i, poc 0.00 0.00 -  0.08 ng/mL   Comment 3          I-stat Chem 8, ED     Status: Abnormal   Collection Time: 09/09/14  4:19 PM  Result Value Ref Range   Sodium 143 135 - 145 mmol/L   Potassium 3.7 3.5 - 5.1 mmol/L   Chloride 106 101 - 111 mmol/L   BUN 16 6 - 20 mg/dL   Creatinine, Ser 0.90 0.61 - 1.24 mg/dL   Glucose, Bld 91 70 - 99 mg/dL   Calcium, Ion 1.22 1.13 - 1.30 mmol/L   TCO2 22 0 - 100 mmol/L   Hemoglobin 12.2 (L) 13.0 - 17.0 g/dL   HCT 36.0 (L) 39.0 - 52.0 %  Protime-INR     Status: None   Collection Time: 09/09/14  8:22 PM  Result Value Ref Range   Prothrombin Time 14.6 11.6 - 15.2 seconds   INR 1.13 0.00 - 1.49  CBC     Status: Abnormal   Collection Time: 09/09/14  8:22 PM  Result Value Ref Range   WBC 10.8 (H) 4.0 - 10.5 K/uL   RBC 3.68 (L) 4.22 - 5.81 MIL/uL   Hemoglobin 11.0 (L) 13.0 - 17.0 g/dL   HCT 34.2 (L) 39.0 - 52.0 %   MCV 92.9 78.0 - 100.0 fL   MCH 29.9 26.0 - 34.0 pg   MCHC 32.2 30.0 - 36.0 g/dL   RDW 14.4 11.5 - 15.5  %   Platelets 204 150 - 400 K/uL  Creatinine, serum     Status: None   Collection Time: 09/09/14  8:22 PM  Result Value Ref Range   Creatinine, Ser 0.93 0.61 - 1.24 mg/dL   GFR calc non Af Amer >60 >60 mL/min   GFR calc Af Amer >60 >60 mL/min  Troponin I     Status: None   Collection Time: 09/09/14  8:22 PM  Result Value Ref Range   Troponin I <0.03 <0.031 ng/mL  TSH     Status: Abnormal   Collection Time: 09/09/14  8:22 PM  Result Value Ref Range   TSH 0.077 (L) 0.350 - 4.500 uIU/mL  Brain natriuretic peptide     Status: Abnormal   Collection Time: 09/09/14  8:22 PM  Result Value Ref Range   B Natriuretic Peptide 141.8 (H) 0.0 - 100.0 pg/mL  Troponin I     Status: None   Collection Time: 09/10/14 12:22 AM  Result Value Ref Range   Troponin I <0.03 <0.031 ng/mL  Lipid panel     Status: None   Collection Time: 09/10/14  4:06 AM  Result Value Ref Range   Cholesterol 106 0 - 200 mg/dL   Triglycerides 39 <150 mg/dL   HDL 43 >40 mg/dL   Total CHOL/HDL Ratio 2.5 RATIO   VLDL 8 0 - 40 mg/dL   LDL Cholesterol 55 0 - 99 mg/dL  Troponin I     Status: None   Collection Time: 09/10/14  6:48 AM  Result Value Ref Range   Troponin I <0.03 <0.031 ng/mL  CBC     Status: Abnormal   Collection Time: 09/10/14  6:48 AM  Result Value Ref Range   WBC 8.7 4.0 - 10.5 K/uL   RBC 3.68 (L) 4.22 - 5.81 MIL/uL   Hemoglobin 11.1 (L) 13.0 - 17.0 g/dL   HCT 34.4 (L) 39.0 - 52.0 %   MCV 93.5 78.0 - 100.0 fL   MCH 30.2 26.0 - 34.0 pg   MCHC 32.3  30.0 - 36.0 g/dL   RDW 14.5 11.5 - 15.5 %   Platelets 202 150 - 400 K/uL  Basic metabolic panel     Status: Abnormal   Collection Time: 09/10/14  6:48 AM  Result Value Ref Range   Sodium 139 135 - 145 mmol/L   Potassium 4.0 3.5 - 5.1 mmol/L   Chloride 108 101 - 111 mmol/L   CO2 24 22 - 32 mmol/L   Glucose, Bld 103 (H) 70 - 99 mg/dL   BUN 11 6 - 20 mg/dL   Creatinine, Ser 0.82 0.61 - 1.24 mg/dL   Calcium 8.5 (L) 8.9 - 10.3 mg/dL   GFR calc non Af Amer  >60 >60 mL/min   GFR calc Af Amer >60 >60 mL/min   Anion gap 7 5 - 15  Protime-INR     Status: Abnormal   Collection Time: 09/10/14  6:48 AM  Result Value Ref Range   Prothrombin Time 15.3 (H) 11.6 - 15.2 seconds   INR 1.20 0.00 - 1.49    Disposition:  Follow-up Information    Follow up with Candee Furbish, MD.   Specialty:  Cardiology   Why:  office will contact you   Contact information:   [address] [address] [phone]       Discharge Medications:    Medication List    TAKE these medications        acetaminophen 325 MG tablet  Commonly known as:  TYLENOL  Take 2 tablets (650 mg total) by mouth every 4 (four) hours as needed for headache or mild pain.     aspirin EC 81 MG tablet  Take 81 mg by mouth daily.     clopidogrel 75 MG tablet  Commonly known as:  PLAVIX  Take 75 mg by mouth daily.     ferrous sulfate 325 (65 FE) MG tablet  Take 650 mg by mouth daily with breakfast.     levothyroxine 25 MCG tablet  Commonly known as:  SYNTHROID, LEVOTHROID  Take 50 mcg by mouth daily before breakfast.     MULTIVITAMIN & MINERAL PO  Take 1 tablet by mouth daily.     nitroGLYCERIN 0.4 MG SL tablet  Commonly known as:  NITROSTAT  Place 0.4 mg under the tongue every 5 (five) minutes as needed for chest pain.     pantoprazole 40 MG tablet  Commonly known as:  PROTONIX  Take 40 mg by mouth daily.     rosuvastatin 10 MG tablet  Commonly known as:  CRESTOR  Take 10 mg by mouth daily.         Duration of Discharge Encounter: Greater than 30 minutes including physician time.  Angelena Form PA-C 09/10/2014 10:28 AM

## 2014-09-10 NOTE — Progress Notes (Signed)
TR band to right wrist deflated 3cc at a time per protocol with no complication. Removed and replaced by dry dressing with gauze and Tegaderm. No bleeding noted, radial pulse strong . No tingling or numbness reported by pt. Will continue to monitor pt.

## 2014-09-10 NOTE — Interval H&P Note (Signed)
History and Physical Interval Note:  09/10/2014 7:53 AM  [name]  has presented today for surgery, with the diagnosis of cp  The various methods of treatment have been discussed with the patient and family. After consideration of risks, benefits and other options for treatment, the patient has consented to  Procedure(s): Left Heart Cath and Cors/Grafts Angiography (N/A) as a surgical intervention .  The patient's history has been reviewed, patient examined, no change in status, stable for surgery.  I have reviewed the patient's chart and labs.  Questions were answered to the patient's satisfaction.   Cath Lab Visit (complete for each Cath Lab visit)  Clinical Evaluation Leading to the Procedure:   ACS: Yes.    Non-ACS:    Anginal Classification: CCS III  Anti-ischemic medical therapy: Maximal Therapy (2 or more classes of medications)  Non-Invasive Test Results: No non-invasive testing performed  Prior CABG: Previous CABG        [name] 09/10/2014 7:53 AM

## 2014-09-11 LAB — HEMOGLOBIN A1C
HEMOGLOBIN A1C: 5.5 % (ref 4.8–5.6)
Mean Plasma Glucose: 111 mg/dL

## 2014-09-11 MED FILL — Lidocaine HCl Local Preservative Free (PF) Inj 1%: INTRAMUSCULAR | Qty: 30 | Status: AC

## 2014-09-11 MED FILL — Heparin Sodium (Porcine) 2 Unit/ML in Sodium Chloride 0.9%: INTRAMUSCULAR | Qty: 1000 | Status: AC

## 2014-09-11 MED FILL — Heparin Sodium (Porcine) 2 Unit/ML in Sodium Chloride 0.9%: INTRAMUSCULAR | Qty: 500 | Status: AC

## 2014-09-14 ENCOUNTER — Encounter (HOSPITAL_COMMUNITY): Payer: Self-pay | Admitting: Interventional Cardiology

## 2014-09-20 ENCOUNTER — Other Ambulatory Visit: Payer: Self-pay | Admitting: Adult Health

## 2014-09-24 ENCOUNTER — Encounter: Payer: Self-pay | Admitting: Nurse Practitioner

## 2014-10-23 ENCOUNTER — Encounter: Payer: Self-pay | Admitting: Nurse Practitioner

## 2014-11-16 ENCOUNTER — Other Ambulatory Visit: Payer: Self-pay | Admitting: *Deleted

## 2014-11-16 MED ORDER — NITROGLYCERIN 0.4 MG SL SUBL: 0.4000 mg | SUBLINGUAL_TABLET | SUBLINGUAL | Status: DC | PRN

## 2014-11-25 ENCOUNTER — Encounter (HOSPITAL_COMMUNITY): Payer: Self-pay | Admitting: Emergency Medicine

## 2014-11-25 ENCOUNTER — Inpatient Hospital Stay (HOSPITAL_COMMUNITY)
Admission: EM | Admit: 2014-11-25 | Discharge: 2014-11-27 | DRG: 812 | Disposition: A | Discharge status: Home / Self Care | Payer: Medicare Other | Attending: Internal Medicine | Admitting: Internal Medicine

## 2014-11-25 DIAGNOSIS — Z86010 Personal history of colonic polyps: Secondary | ICD-10-CM | POA: Diagnosis not present

## 2014-11-25 DIAGNOSIS — K922 Gastrointestinal hemorrhage, unspecified: Secondary | ICD-10-CM | POA: Diagnosis present

## 2014-11-25 DIAGNOSIS — Z951 Presence of aortocoronary bypass graft: Secondary | ICD-10-CM | POA: Diagnosis not present

## 2014-11-25 DIAGNOSIS — K259 Gastric ulcer, unspecified as acute or chronic, without hemorrhage or perforation: Secondary | ICD-10-CM | POA: Diagnosis present

## 2014-11-25 DIAGNOSIS — Z79899 Other long term (current) drug therapy: Secondary | ICD-10-CM | POA: Diagnosis not present

## 2014-11-25 DIAGNOSIS — E785 Hyperlipidemia, unspecified: Secondary | ICD-10-CM | POA: Diagnosis present

## 2014-11-25 DIAGNOSIS — I252 Old myocardial infarction: Secondary | ICD-10-CM | POA: Diagnosis not present

## 2014-11-25 DIAGNOSIS — Z955 Presence of coronary angioplasty implant and graft: Secondary | ICD-10-CM | POA: Diagnosis not present

## 2014-11-25 DIAGNOSIS — Z888 Allergy status to other drugs, medicaments and biological substances status: Secondary | ICD-10-CM

## 2014-11-25 DIAGNOSIS — E039 Hypothyroidism, unspecified: Secondary | ICD-10-CM

## 2014-11-25 DIAGNOSIS — Z7902 Long term (current) use of antithrombotics/antiplatelets: Secondary | ICD-10-CM | POA: Diagnosis not present

## 2014-11-25 DIAGNOSIS — K449 Diaphragmatic hernia without obstruction or gangrene: Secondary | ICD-10-CM | POA: Diagnosis present

## 2014-11-25 DIAGNOSIS — J449 Chronic obstructive pulmonary disease, unspecified: Secondary | ICD-10-CM | POA: Diagnosis present

## 2014-11-25 DIAGNOSIS — Z885 Allergy status to narcotic agent status: Secondary | ICD-10-CM

## 2014-11-25 DIAGNOSIS — Z886 Allergy status to analgesic agent status: Secondary | ICD-10-CM | POA: Diagnosis not present

## 2014-11-25 DIAGNOSIS — I2510 Atherosclerotic heart disease of native coronary artery without angina pectoris: Secondary | ICD-10-CM

## 2014-11-25 DIAGNOSIS — D62 Acute posthemorrhagic anemia: Principal | ICD-10-CM | POA: Diagnosis present

## 2014-11-25 DIAGNOSIS — Z7982 Long term (current) use of aspirin: Secondary | ICD-10-CM

## 2014-11-25 DIAGNOSIS — K222 Esophageal obstruction: Secondary | ICD-10-CM | POA: Diagnosis present

## 2014-11-25 DIAGNOSIS — Z87891 Personal history of nicotine dependence: Secondary | ICD-10-CM | POA: Diagnosis not present

## 2014-11-25 HISTORY — DX: Hypothyroidism, unspecified: E03.9

## 2014-11-25 HISTORY — DX: Anemia, unspecified: D64.9

## 2014-11-25 HISTORY — DX: Adverse effect of other nonsteroidal anti-inflammatory drugs (NSAID), initial encounter: T39.395A

## 2014-11-25 HISTORY — DX: Gastrointestinal hemorrhage, unspecified: K92.2

## 2014-11-25 LAB — COMPREHENSIVE METABOLIC PANEL
ALT: 12 U/L — ABNORMAL LOW (ref 17–63)
ANION GAP: 6 (ref 5–15)
AST: 18 U/L (ref 15–41)
Albumin: 3.7 g/dL (ref 3.5–5.0)
Alkaline Phosphatase: 49 U/L (ref 38–126)
BILIRUBIN TOTAL: 0.5 mg/dL (ref 0.3–1.2)
BUN: 15 mg/dL (ref 6–20)
CALCIUM: 9 mg/dL (ref 8.9–10.3)
CHLORIDE: 108 mmol/L (ref 101–111)
CO2: 26 mmol/L (ref 22–32)
CREATININE: 1.15 mg/dL (ref 0.61–1.24)
GFR calc Af Amer: >60 mL/min (ref >60)
GFR, EST NON AFRICAN AMERICAN: 59 mL/min — AB (ref >60)
GLUCOSE: 95 mg/dL (ref 65–99)
Potassium: 3.9 mmol/L (ref 3.5–5.1)
Sodium: 140 mmol/L (ref 135–145)
TOTAL PROTEIN: 6.2 g/dL — AB (ref 6.5–8.1)

## 2014-11-25 LAB — PROTIME-INR
INR: 1.08 (ref 0.00–1.49)
Prothrombin Time: 14.2 seconds (ref 11.6–15.2)

## 2014-11-25 LAB — CBC WITH DIFFERENTIAL/PLATELET
BASOS ABS: 0 10*3/uL (ref 0.0–0.1)
BASOS PCT: 1 % (ref 0–1)
Eosinophils Absolute: 0.1 10*3/uL (ref 0.0–0.7)
Eosinophils Relative: 1 % (ref 0–5)
HCT: 24.5 % — ABNORMAL LOW (ref 39.0–52.0)
Hemoglobin: 7.8 g/dL — ABNORMAL LOW (ref 13.0–17.0)
LYMPHS PCT: 16 % (ref 12–46)
Lymphs Abs: 1 10*3/uL (ref 0.7–4.0)
MCH: 30.8 pg (ref 26.0–34.0)
MCHC: 31.8 g/dL (ref 30.0–36.0)
MCV: 96.8 fL (ref 78.0–100.0)
Monocytes Absolute: 0.8 10*3/uL (ref 0.1–1.0)
Monocytes Relative: 13 % — ABNORMAL HIGH (ref 3–12)
NEUTROS ABS: 4.3 10*3/uL (ref 1.7–7.7)
Neutrophils Relative %: 69 % (ref 43–77)
Platelets: 258 10*3/uL (ref 150–400)
RBC: 2.53 MIL/uL — AB (ref 4.22–5.81)
RDW: 15.8 % — ABNORMAL HIGH (ref 11.5–15.5)
WBC: 6.2 10*3/uL (ref 4.0–10.5)

## 2014-11-25 LAB — HEMOGLOBIN AND HEMATOCRIT, BLOOD
HEMATOCRIT: 22.8 % — AB (ref 39.0–52.0)
Hemoglobin: 7.2 g/dL — ABNORMAL LOW (ref 13.0–17.0)

## 2014-11-25 LAB — PREPARE RBC (CROSSMATCH)

## 2014-11-25 LAB — TSH: TSH: 4.89 u[IU]/mL — ABNORMAL HIGH (ref 0.350–4.500)

## 2014-11-25 MED ORDER — ADULT MULTIVITAMIN W/MINERALS CH
1.0000 | ORAL_TABLET | Freq: Every day | ORAL | Status: DC
  Administered 2014-11-26: 1 via ORAL
  Filled 2014-11-25 (×3): qty 1

## 2014-11-25 MED ORDER — ACETAMINOPHEN 325 MG PO TABS
650.0000 mg | ORAL_TABLET | Freq: Four times a day (QID) | ORAL | Status: DC | PRN
Start: 2014-11-25 — End: 2014-11-27
  Administered 2014-11-26: 650 mg via ORAL
  Filled 2014-11-25: qty 2

## 2014-11-25 MED ORDER — LEVOTHYROXINE SODIUM 50 MCG PO TABS
50.0000 ug | ORAL_TABLET | Freq: Every day | ORAL | Status: DC
  Administered 2014-11-26 – 2014-11-27 (×2): 50 ug via ORAL
  Filled 2014-11-25 (×3): qty 1

## 2014-11-25 MED ORDER — SODIUM CHLORIDE 0.9 % IV SOLN
INTRAVENOUS | Status: DC
  Administered 2014-11-26: 07:00:00 via INTRAVENOUS

## 2014-11-25 MED ORDER — SODIUM CHLORIDE 0.9 % IV SOLN
Freq: Once | INTRAVENOUS | Status: AC
  Administered 2014-11-25: 14:00:00 via INTRAVENOUS

## 2014-11-25 MED ORDER — ONDANSETRON HCL 4 MG/2ML IJ SOLN: 4.0000 mg | Freq: Four times a day (QID) | INTRAMUSCULAR | Status: DC | PRN

## 2014-11-25 MED ORDER — ONDANSETRON HCL 4 MG PO TABS: 4.0000 mg | ORAL_TABLET | Freq: Four times a day (QID) | ORAL | Status: DC | PRN

## 2014-11-25 MED ORDER — ALUM & MAG HYDROXIDE-SIMETH 200-200-20 MG/5ML PO SUSP: 30.0000 mL | Freq: Four times a day (QID) | ORAL | Status: DC | PRN

## 2014-11-25 MED ORDER — ACETAMINOPHEN 650 MG RE SUPP: 650.0000 mg | Freq: Four times a day (QID) | RECTAL | Status: DC | PRN

## 2014-11-25 MED ORDER — NITROGLYCERIN 0.4 MG SL SUBL: 0.4000 mg | SUBLINGUAL_TABLET | SUBLINGUAL | Status: DC | PRN

## 2014-11-25 MED ORDER — PANTOPRAZOLE SODIUM 40 MG IV SOLR
40.0000 mg | Freq: Two times a day (BID) | INTRAVENOUS | Status: DC
  Administered 2014-11-25: 40 mg via INTRAVENOUS
  Filled 2014-11-25 (×3): qty 40

## 2014-11-25 MED ORDER — ROSUVASTATIN CALCIUM 10 MG PO TABS
10.0000 mg | ORAL_TABLET | Freq: Every day | ORAL | Status: DC
  Administered 2014-11-26: 10 mg via ORAL
  Filled 2014-11-25 (×2): qty 1

## 2014-11-25 MED ORDER — SODIUM CHLORIDE 0.9 % IV SOLN: Freq: Once | INTRAVENOUS | Status: DC

## 2014-11-25 NOTE — ED Provider Notes (Addendum)
CSN: [id number]     Arrival date & time 11/25/14  1300 History   First MD Initiated Contact with Patient 11/25/14 1315     Chief Complaint  Patient presents with  . Shortness of Breath  . Dizziness      HPI  Impression presents evaluation of dizziness and anemia. History of a duodenal AVM. Previous admission with GI bleed. His been dizzy lightheaded and orthostatic for the last week. Seen by his primary care physician Dr. Karlton Lemon today. Had a hemoglobin of 7. Referred him here for admission regarding upper GI bleeding.  Past Medical History  Diagnosis Date  . Complication of anesthesia 1987    "didn't wake up for 5 days"  . High cholesterol   . Anginal pain   . Myocardial infarction 1987  . Exertional dyspnea 01/04/2012  . History of blood transfusion 1987    "emergent CABG"  . Numbness and tingling in hands 01/04/2012    "right one goes to sleep on steering wheel when I'm driving; been that way long time; @ least 1 yr"  . Thyroid disease   . Coronary artery disease   . COPD (chronic obstructive pulmonary disease)   . Hyperlipidemia    Past Surgical History  Procedure Laterality Date  . Coronary angioplasty with stent placement  ? 1990's    "3"  . Coronary angioplasty with stent placement  01/04/2012    "2; total of 5"  . Wrist fracture surgery  ~ 2009    left  . Wrist hardware removal  ~ 2010    "took steel bar out"  . Colonoscopy w/ polypectomy  2011    "had to take it out in 3 pieces; all benign"  . Cataract extraction w/ intraocular lens  implant, bilateral  2012  . Coronary artery bypass graft  1987    CABG X1  . Esophagogastroduodenoscopy  02/07/2012    Procedure: ESOPHAGOGASTRODUODENOSCOPY (EGD);  Surgeon: Wonda Horner, MD;  Location[facility] ENDOSCOPY;  Service: Endoscopy;  Laterality: N/A;  . Percutaneous coronary stent intervention (pci-s) N/A 01/04/2012    Procedure: PERCUTANEOUS CORONARY STENT INTERVENTION (PCI-S);  Surgeon: Sinclair Grooms, MD;  Location: [facility]  CATH LAB;  Service: Cardiovascular;  Laterality: N/A;  . Coronary artery bypass graft    . Arm surgery    . Cardiac catheterization    . Cardiac catheterization N/A 09/10/2014    Procedure: Left Heart Cath and Cors/Grafts Angiography;  Surgeon: Peter M Martinique, MD;  Location: [facility] CV LAB [facility]UP[facility];  Service: Cardiovascular;  Laterality: N/A;   Family History  Problem Relation Age of Onset  . Emphysema Father     ? if he smoked or not    History  Substance Use Topics  . Smoking status: Former Smoker -- 1.00 packs/day for 40 years    Types: Cigarettes    Quit date: 05/08/1988  . Smokeless tobacco: Never Used  . Alcohol Use: No     Comment: 01/04/2012 "hadn't drank any alcohol in over 15 years"    Review of Systems  Constitutional: Negative for fever, chills, diaphoresis, appetite change and fatigue.  HENT: Negative for mouth sores, sore throat and trouble swallowing.   Eyes: Negative for visual disturbance.       Conjunctiva appear pale  Respiratory: Negative for cough, chest tightness, shortness of breath and wheezing.   Cardiovascular: Negative for chest pain.  Gastrointestinal: Negative for nausea, vomiting, abdominal pain, diarrhea and abdominal distention.       Soft benign  abdomen. Guaiac + black stool.  Endocrine: Negative for polydipsia, polyphagia and polyuria.  Genitourinary: Negative for dysuria, frequency and hematuria.  Musculoskeletal: Negative for gait problem.  Skin: Negative for color change, pallor and rash.  Neurological: Negative for dizziness, syncope, light-headedness and headaches.  Hematological: Does not bruise/bleed easily.  Psychiatric/Behavioral: Negative for behavioral problems and confusion.      Allergies  Ibuprofen; Morphine and related; and Atorvastatin  Home Medications   Prior to Admission medications   Medication Sig Start Date End Date Taking? Authorizing Provider  aspirin EC 81 MG tablet Take 81 mg by mouth daily.   Yes Historical  Provider, MD  clopidogrel (PLAVIX) 75 MG tablet Take 75 mg by mouth daily.   Yes Historical Provider, MD  ferrous sulfate 325 (65 FE) MG tablet Take 650 mg by mouth daily with breakfast.   Yes Historical Provider, MD  levothyroxine (SYNTHROID, LEVOTHROID) 50 MCG tablet Take 50 mcg by mouth daily before breakfast.   Yes Historical Provider, MD  Multiple Vitamin (MULTIVITAMIN WITH MINERALS) TABS Take 1 tablet by mouth daily.   Yes Historical Provider, MD  nitroGLYCERIN (NITROSTAT) 0.4 MG SL tablet Place 1 tablet (0.4 mg total) under the tongue every 5 (five) minutes as needed. For chest pain 11/16/14  Yes Jerline Pain, MD  rosuvastatin (CRESTOR) 10 MG tablet Take 10 mg by mouth daily.   Yes Historical Provider, MD  acetaminophen (TYLENOL) 325 MG tablet Take 2 tablets (650 mg total) by mouth every 4 (four) hours as needed for headache or mild pain. 09/10/14   Erlene Quan, PA-C  aspirin 81 MG EC tablet Take 81 mg by mouth daily. 01/13/12   Jacolyn Reedy, MD  clopidogrel (PLAVIX) 75 MG tablet TAKE 1 TABLET BY MOUTH EVERY DAY 09/21/14   Jerline Pain, MD  ferrous sulfate 325 (65 FE) MG tablet Take 650 mg by mouth daily with breakfast.     Historical Provider, MD  levothyroxine (LEVOTHROID) 25 MCG tablet Take 2 tablets (50 mcg total) by mouth daily before breakfast. 12/16/13   Tanda Rockers, MD  levothyroxine (SYNTHROID, LEVOTHROID) 25 MCG tablet Take 50 mcg by mouth daily before breakfast.    Historical Provider, MD  Multiple Vitamins-Minerals (MULTIVITAMIN & MINERAL PO) Take 1 tablet by mouth daily.    Historical Provider, MD  pantoprazole (PROTONIX) 40 MG tablet 1 daily before first meal 04/20/13   Jerline Pain, MD  pantoprazole (PROTONIX) 40 MG tablet Take 40 mg by mouth daily.    Historical Provider, MD  rosuvastatin (CRESTOR) 10 MG tablet Take 1 tablet (10 mg total) by mouth daily. 06/23/14   Jerline Pain, MD   BP 118/49 mmHg  Pulse 57  Temp(Src) 97.9 F (36.6 C) (Oral)  Resp 13  Ht '5\' 8"'$   (1.727 m)  Wt 140 lb (63.504 kg)  BMI 21.29 kg/m2  SpO2 99% Physical Exam  Constitutional: He is oriented to person, place, and time. He appears well-developed and well-nourished. No distress.  HENT:  Head: Normocephalic.  Eyes: Conjunctivae are normal. Pupils are equal, round, and reactive to light. No scleral icterus.  Neck: Normal range of motion. Neck supple. No thyromegaly present.  Cardiovascular: Normal rate and regular rhythm.  Exam reveals no gallop and no friction rub.   No murmur heard. Pulmonary/Chest: Effort normal and breath sounds normal. No respiratory distress. He has no wheezes. He has no rales.  Abdominal: Soft. Bowel sounds are normal. He exhibits no distension. There is no tenderness.  There is no rebound.  Musculoskeletal: Normal range of motion.  Neurological: He is alert and oriented to person, place, and time.  Skin: Skin is warm and dry. No rash noted.  Nailbeds and conjunctiva appear pale.  Psychiatric: He has a normal mood and affect. His behavior is normal.    ED Course  Procedures (including critical care time) Labs Review Labs Reviewed  CBC WITH DIFFERENTIAL/PLATELET - Abnormal; Notable for the following:    RBC 2.53 (*)    Hemoglobin 7.8 (*)    HCT 24.5 (*)    RDW 15.8 (*)    Monocytes Relative 13 (*)    All other components within normal limits  COMPREHENSIVE METABOLIC PANEL - Abnormal; Notable for the following:    Total Protein 6.2 (*)    ALT 12 (*)    GFR calc non Af Amer 59 (*)    All other components within normal limits  PROTIME-INR  TYPE AND SCREEN    Imaging Review No results found.   EKG Interpretation None      MDM   Final diagnoses:  Upper GI bleed    Discussed with Dr. Stacie Glaze GI. Discussed with Dr.Rai of the hospitalists. The patient will be admitted. He is quite stable. Has undergone type and screen.    Tanna Furry, MD 11/25/14 1613  Tanna Furry, MD 01/03/15 1517  Tanna Furry, MD 01/07/15 [phone]

## 2014-11-25 NOTE — H&P (Signed)
History and Physical        Hospital Admission Note Date: 11/25/2014  Patient name: Timothy URIZAR Sr. Medical record number: [id number] Date of birth: 1933/06/24 Age: 81 y.o. Gender: male  PCP: Mayra Neer, MD  Referring physician:Dr Jeneen Rinks  Chief Complaint:  Dark stools were dizziness, lightheadedness  HPI: Patient is a 81 year old male with CAD, on aspirin and Plavix, hyperlipidemia, hypothyroidism, COPD presented to ED from his PCPs office for generalized weakness, dizziness, lightheadedness and possible GI bleed. Patient was noted to have a hemoglobin of 7 at the PCPs office and sent to the ER. Patient reported that he has noticed mild epigastric pain off and on, intermittent in the last 3 weeks, dizziness, lightheadedness and exertional shortness of breath. He was at work today and around 11 AM, felt very dizzy and pale. Patient went to see his PCP and was found to have a hemoglobin of 7. He reports dark colored stools for the last 3 weeks however he takes iron hence he did not get too concerned about the stools. He also is on aspirin and Plavix. He denied any nausea, vomiting, hematemesis.  Review of Systems:  Constitutional: Denies fever, chills, diaphoresis, poor appetite and fatigue.  HEENT: Denies photophobia, eye pain, redness, hearing loss, ear pain, congestion, sore throat, rhinorrhea, sneezing, mouth sores, trouble swallowing, neck pain, neck stiffness and tinnitus.   Respiratory: Denies +DOE, cough, chest tightness,  and wheezing.   Cardiovascular: Denies chest pain, palpitations and leg swelling.  Gastrointestinal: Please see history of present illness Genitourinary: Denies dysuria, urgency, frequency, hematuria, flank pain and difficulty urinating.  Musculoskeletal: Denies myalgias, back pain, joint swelling, arthralgias and gait problem.  Skin: Denies pallor,  rash and wound.  Neurological: Denies syncope, seizure, numbness and headaches.  dizziness, lightheadedness, generalized weakness+ Hematological: Denies adenopathy. Easy bruising, personal or family bleeding history  Psychiatric/Behavioral: Denies suicidal ideation, mood changes, confusion, nervousness, sleep disturbance and agitation  Past Medical History: Past Medical History  Diagnosis Date  . Complication of anesthesia 1987    "didn't wake up for 5 days"  . High cholesterol   . Anginal pain   . Myocardial infarction 1987  . Exertional dyspnea 01/04/2012  . History of blood transfusion 1987    "emergent CABG"  . Numbness and tingling in hands 01/04/2012    "right one goes to sleep on steering wheel when I'm driving; been that way long time; @ least 1 yr"  . Thyroid disease   . Coronary artery disease   . COPD (chronic obstructive pulmonary disease)   . Hyperlipidemia     Past Surgical History  Procedure Laterality Date  . Coronary angioplasty with stent placement  ? 1990's    "3"  . Coronary angioplasty with stent placement  01/04/2012    "2; total of 5"  . Wrist fracture surgery  ~ 2009    left  . Wrist hardware removal  ~ 2010    "took steel bar out"  . Colonoscopy w/ polypectomy  2011    "had to take it out in 3 pieces; all benign"  . Cataract extraction w/ intraocular lens  implant, bilateral  2012  . Coronary artery bypass graft  1987    CABG X1  . Esophagogastroduodenoscopy  02/07/2012    Procedure: ESOPHAGOGASTRODUODENOSCOPY (EGD);  Surgeon: Wonda Horner, MD;  Location[facility] END[facility]COPY;  Service: Endoscopy;  Laterality: N/A;  . Percutaneous coronary stent intervention (pci-s) N/A 01/04/2012    Procedure: PERCUTANEOUS CORONARY STENT INTERVENTION (PCI-S);  Surgeon: Sinclair Grooms, MD;  Location: [facility] CATH LAB;  Service: Cardiovascular;  Laterality: N/A;  . Coronary artery bypass graft    . Arm surgery    . Cardiac catheterization    . Cardiac catheterization N/A  09/10/2014    Procedure: Left Heart Cath and Cors/Grafts Angiography;  Surgeon: Peter M Martinique, MD;  Location: [facility] INVASIVE CV LAB CUPID;  Service: Cardiovascular;  Laterality: N/A;    Medications: Prior to Admission medications   Medication Sig Start Date End Date Taking? Authorizing Provider  aspirin EC 81 MG tablet Take 81 mg by mouth daily.   Yes Historical Provider, MD  clopidogrel (PLAVIX) 75 MG tablet Take 75 mg by mouth daily.   Yes Historical Provider, MD  ferrous sulfate 325 (65 FE) MG tablet Take 650 mg by mouth daily with breakfast.   Yes Historical Provider, MD  levothyroxine (SYNTHROID, LEVOTHROID) 50 MCG tablet Take 50 mcg by mouth daily before breakfast.   Yes Historical Provider, MD  Multiple Vitamin (MULTIVITAMIN WITH MINERALS) TABS Take 1 tablet by mouth daily.   Yes Historical Provider, MD  nitroGLYCERIN (NITROSTAT) 0.4 MG SL tablet Place 1 tablet (0.4 mg total) under the tongue every 5 (five) minutes as needed. For chest pain 11/16/14  Yes Jerline Pain, MD  rosuvastatin (CRESTOR) 10 MG tablet Take 10 mg by mouth daily.   Yes Historical Provider, MD  acetaminophen (TYLENOL) 325 MG tablet Take 2 tablets (650 mg total) by mouth every 4 (four) hours as needed for headache or mild pain. 09/10/14   Erlene Quan, PA-C  aspirin 81 MG EC tablet Take 81 mg by mouth daily. 01/13/12   Jacolyn Reedy, MD  clopidogrel (PLAVIX) 75 MG tablet TAKE 1 TABLET BY MOUTH EVERY DAY 09/21/14   Jerline Pain, MD  ferrous sulfate 325 (65 FE) MG tablet Take 650 mg by mouth daily with breakfast.     Historical Provider, MD  levothyroxine (LEVOTHROID) 25 MCG tablet Take 2 tablets (50 mcg total) by mouth daily before breakfast. 12/16/13   Tanda Rockers, MD  levothyroxine (SYNTHROID, LEVOTHROID) 25 MCG tablet Take 50 mcg by mouth daily before breakfast.    Historical Provider, MD  Multiple Vitamins-Minerals (MULTIVITAMIN & MINERAL PO) Take 1 tablet by mouth daily.    Historical Provider, MD  pantoprazole  (PROTONIX) 40 MG tablet 1 daily before first meal 04/20/13   Jerline Pain, MD  pantoprazole (PROTONIX) 40 MG tablet Take 40 mg by mouth daily.    Historical Provider, MD  rosuvastatin (CRESTOR) 10 MG tablet Take 1 tablet (10 mg total) by mouth daily. 06/23/14   Jerline Pain, MD    Allergies:   Allergies  Allergen Reactions  . Ibuprofen Other (See Comments)    Internal bleeding  . Morphine And Related Nausea And Vomiting  . Atorvastatin     Muscle pain    Social History:  reports that he quit smoking about 26 years ago. His smoking use included Cigarettes. He has a 40 pack-year smoking history. He has never used smokeless tobacco. He reports that he does not drink alcohol or use illicit drugs.  Family History: Family History  Problem Relation  Age of Onset  . Emphysema Father     ? if he smoked or not     Physical Exam: Blood pressure 118/49, pulse 57, temperature 97.9 F (36.6 C), temperature source Oral, resp. rate 13, height '5\' 8"'$  (1.727 m), weight 63.504 kg (140 lb), SpO2 99 %. General: Alert, awake, oriented x3, in no acute distress. HEENT: normocephalic, atraumatic, anicteric sclera, pink conjunctiva, pupils equal and reactive to light and accomodation, oropharynx clear Neck: supple, no masses or lymphadenopathy, no goiter, no bruits  Heart: Regular rate and rhythm, without murmurs, rubs or gallops. Lungs: Clear to auscultation bilaterally, no wheezing, rales or rhonchi. Abdomen: Soft, nontender, nondistended, positive bowel sounds, no masses. Extremities: No clubbing, cyanosis or edema with positive pedal pulses. Neuro: Grossly intact, no focal neurological deficits, strength 5/5 upper and lower extremities bilaterally Psych: alert and oriented x 3, normal mood and affect Skin: no rashes or lesions, warm and dry   LABS on Admission:  Basic Metabolic Panel:  Recent Labs Lab 11/25/14 1325  NA 140  K 3.9  CL 108  CO2 26  GLUCOSE 95  BUN 15  CREATININE 1.15    CALCIUM 9.0   Liver Function Tests:  Recent Labs Lab 11/25/14 1325  AST 18  ALT 12*  ALKPHOS 49  BILITOT 0.5  PROT 6.2*  ALBUMIN 3.7   No results for input(s): LIPASE, AMYLASE in the last 168 hours. No results for input(s): AMMONIA in the last 168 hours. CBC:  Recent Labs Lab 11/25/14 1325  WBC 6.2  NEUTROABS 4.3  HGB 7.8*  HCT 24.5*  MCV 96.8  PLT 258   Cardiac Enzymes: No results for input(s): CKTOTAL, CKMB, CKMBINDEX, TROPONINI in the last 168 hours. BNP: Invalid input(s): POCBNP CBG: No results for input(s): GLUCAP in the last 168 hours.  Radiological Exams on Admission:  No results found.  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 Paulita Fujita to evaluate patient  Active Problems:   CAD - Currently no chest pain, hold aspirin and Plavix due to #1    COPD GOLD II - Currently stable, no wheezing    Hypothyroidism - Continue Synthroid, follow TSH   DVT prophylaxis:  SCDs   CODE STATUS:  Full code  Family Communication: Admission, patients condition and plan of care including tests being ordered have been discussed with the patient and wife who indicates understanding and agree with the plan and Code Status  Disposition plan: Further plan will depend as patient's clinical course evolves and further radiologic and laboratory data become available.   Time Spent on Admission: 55mns   Werner Labella M.D. [facility] 11/25/2014, 4:26 PM Pager: [phone] If 7PM-7AM, please contact night-coverage [email or web address] Password [email or web address]

## 2014-11-25 NOTE — ED Notes (Signed)
Patient sent over here from [facility] for SOB, weakness and possible GI bleed.

## 2014-11-25 NOTE — ED Notes (Signed)
Dr. James at bedside  

## 2014-11-25 NOTE — ED Notes (Signed)
Pt monitored by pulse ox, bp cuff, and 12-lead. 

## 2014-11-26 ENCOUNTER — Encounter (HOSPITAL_COMMUNITY)
Admission: EM | Disposition: A | Discharge status: Home / Self Care | Payer: Self-pay | Source: Home / Self Care | Attending: Internal Medicine

## 2014-11-26 ENCOUNTER — Encounter (HOSPITAL_COMMUNITY): Payer: Self-pay | Admitting: *Deleted

## 2014-11-26 DIAGNOSIS — J449 Chronic obstructive pulmonary disease, unspecified: Secondary | ICD-10-CM

## 2014-11-26 HISTORY — PX: ESOPHAGOGASTRODUODENOSCOPY (EGD) WITH PROPOFOL: SHX5813

## 2014-11-26 LAB — HEMOGLOBIN AND HEMATOCRIT, BLOOD
HCT: 30.7 % — ABNORMAL LOW (ref 39.0–52.0)
HEMOGLOBIN: 9.9 g/dL — AB (ref 13.0–17.0)

## 2014-11-26 LAB — TYPE AND SCREEN
ABO/RH(D): A POS
Antibody Screen: NEGATIVE
UNIT DIVISION: 0
Unit division: 0

## 2014-11-26 LAB — CBC
HEMATOCRIT: 30.6 % — AB (ref 39.0–52.0)
Hemoglobin: 9.9 g/dL — ABNORMAL LOW (ref 13.0–17.0)
MCH: 30.5 pg (ref 26.0–34.0)
MCHC: 32.4 g/dL (ref 30.0–36.0)
MCV: 94.2 fL (ref 78.0–100.0)
PLATELETS: 226 10*3/uL (ref 150–400)
RBC: 3.25 MIL/uL — ABNORMAL LOW (ref 4.22–5.81)
RDW: 16 % — ABNORMAL HIGH (ref 11.5–15.5)
WBC: 7.4 10*3/uL (ref 4.0–10.5)

## 2014-11-26 LAB — BASIC METABOLIC PANEL
ANION GAP: 8 (ref 5–15)
BUN: 13 mg/dL (ref 6–20)
CO2: 24 mmol/L (ref 22–32)
CREATININE: 1.05 mg/dL (ref 0.61–1.24)
Calcium: 8.5 mg/dL — ABNORMAL LOW (ref 8.9–10.3)
Chloride: 109 mmol/L (ref 101–111)
Glucose, Bld: 81 mg/dL (ref 65–99)
POTASSIUM: 3.9 mmol/L (ref 3.5–5.1)
Sodium: 141 mmol/L (ref 135–145)

## 2014-11-26 LAB — OCCULT BLOOD X 1 CARD TO LAB, STOOL: Fecal Occult Bld: POSITIVE — AB

## 2014-11-26 SURGERY — ESOPHAGOGASTRODUODENOSCOPY (EGD) WITH PROPOFOL
Anesthesia: Moderate Sedation | Laterality: Left

## 2014-11-26 MED ORDER — BUTAMBEN-TETRACAINE-BENZOCAINE 2-2-14 % EX AERO
INHALATION_SPRAY | CUTANEOUS | Status: DC | PRN
  Administered 2014-11-26: 2 via TOPICAL

## 2014-11-26 MED ORDER — SODIUM CHLORIDE 0.9 % IV SOLN
INTRAVENOUS | Status: DC
  Administered 2014-11-26: 11:00:00 via INTRAVENOUS

## 2014-11-26 MED ORDER — DIPHENHYDRAMINE HCL 50 MG/ML IJ SOLN
INTRAMUSCULAR | Status: AC
  Filled 2014-11-26: qty 1

## 2014-11-26 MED ORDER — MIDAZOLAM HCL 10 MG/2ML IJ SOLN
INTRAMUSCULAR | Status: DC | PRN
  Administered 2014-11-26: 2 mg via INTRAVENOUS

## 2014-11-26 MED ORDER — FENTANYL CITRATE (PF) 100 MCG/2ML IJ SOLN
INTRAMUSCULAR | Status: AC
  Filled 2014-11-26: qty 2

## 2014-11-26 MED ORDER — PANTOPRAZOLE SODIUM 40 MG PO TBEC
40.0000 mg | DELAYED_RELEASE_TABLET | Freq: Two times a day (BID) | ORAL | Status: DC
  Administered 2014-11-26 (×2): 40 mg via ORAL
  Filled 2014-11-26 (×2): qty 1

## 2014-11-26 MED ORDER — FENTANYL CITRATE (PF) 100 MCG/2ML IJ SOLN
INTRAMUSCULAR | Status: DC | PRN
  Administered 2014-11-26: 25 ug via INTRAVENOUS

## 2014-11-26 MED ORDER — CLOPIDOGREL BISULFATE 75 MG PO TABS
75.0000 mg | ORAL_TABLET | Freq: Every day | ORAL | Status: DC
  Administered 2014-11-26: 75 mg via ORAL
  Filled 2014-11-26 (×2): qty 1

## 2014-11-26 MED ORDER — MIDAZOLAM HCL 5 MG/ML IJ SOLN
INTRAMUSCULAR | Status: AC
  Filled 2014-11-26: qty 2

## 2014-11-26 NOTE — Progress Notes (Signed)
OK to resume Plavix from GI perspective.

## 2014-11-26 NOTE — Interval H&P Note (Signed)
History and Physical Interval Note:  11/26/2014 11:08 AM  [name]  has presented today for surgery, with the diagnosis of anemia, melena  The various methods of treatment have been discussed with the patient and family. After consideration of risks, benefits and other options for treatment, the patient has consented to  Procedure(s): ESOPHAGOGASTRODUODENOSCOPY (EGD) WITH PROPOFOL (Left) as a surgical intervention .  The patient's history has been reviewed, patient examined, no change in status, stable for surgery.  I have reviewed the patient's chart and labs.  Questions were answered to the patient's satisfaction.     Stefani Baik M  Assessment:  1.  Anemia.  History gastric and small bowel AVMs.  Plan:  1.  Endoscopy. 2.  Risks (bleeding, infection, bowel perforation that could require surgery, sedation-related changes in cardiopulmonary systems), benefits (identification and possible treatment of source of symptoms, exclusion of certain causes of symptoms), and alternatives (watchful waiting, radiographic imaging studies, empiric medical treatment) of upper endoscopy (EGD) were explained to patient/family in detail and patient wishes to proceed.

## 2014-11-26 NOTE — Op Note (Addendum)
[facility] [place], [id number]   ENDOSCOPY PROCEDURE REPORT  PATIENT: [name], [name]  MR#: [id number] BIRTHDATE: July 31, 1933 , 19  yrs. old GENDER: male ENDOSCOPIST: Arta Silence, MD REFERRED BY:  [facility] PROCEDURE DATE:  Dec 25, 2014 PROCEDURE:  EGD, diagnostic ASA CLASS:     Class III INDICATIONS:  anemia, dark stools, history duodenal AVMs MEDICATIONS: Fentanyl 25 mcg IV, midazolam 2 mg IV TOPICAL ANESTHETIC: Cetacaine spray x 2  DESCRIPTION OF PROCEDURE: After the risks benefits and alternatives of the procedure were thoroughly explained, informed consent was obtained.  The Pentax Gastroscope Peds [id number] endoscope was introduced through the mouth and advanced to the second portion of the duodenum. The instrument was slowly withdrawn as the mucosa was fully examined. Estimated blood loss is zero unless otherwise noted in this procedure report.     Findings:  Small hiatal hernia with widely patent Schatzki's ring. Mild inflammatory and ulcerative changes along rim of hiatal hernia. Otherwise normal esophagus, stomach, pylorus and duodenum to the second portion.   No ulcers, masses, AVMs seen.  No old or fresh blood seen to the extent of our examination. The scope was then withdrawn from the patient and the procedure completed.  COMPLICATIONS: There were no immediate complications.  ENDOSCOPIC IMPRESSION:     As above.  Doubt mild ulceration along hiatal hernia is clinically significant.  No obvious source of anemia.  Of note, patient with history of large hepatic flexure polyp that was removed at [facility] and follow-up colonoscopy 2012 showed couple small polyps but was otherwise unrevealing.  RECOMMENDATIONS:     1.  Watch for potential complications of procedure. 2.  Oral iron supplementation. 3.  PPI. 4.  No need for further inpatient GI evaluation (capsule endoscopy, repeat colonoscopy) in absence of overt  and/or destabilizing bleeding or persistent downtrend in hemoglobin. 5.  Will follow at a distance.  Thank you for the consultation.  eSigned:  Arta Silence, MD 2014/12/25 11:42 AM Revised: 25-Dec-2014 11:42 AM  CC:  CPT CODES: ICD CODES:  The ICD and CPT codes recommended by this software are interpretations from the data that the clinical staff has captured with the software.  The verification of the translation of this report to the ICD and CPT codes and modifiers is the sole responsibility of the health care institution and practicing physician where this report was generated.  [employer] will not be held responsible for the validity of the ICD and CPT codes included on this report.  AMA assumes no liability for data contained or not contained herein. CPT is a Designer, television/film set of the [employer].

## 2014-11-26 NOTE — Progress Notes (Signed)
Pt tolerated clear liquids, stated he was hungry and felt weak due to not eating. Advanced to full liquid diet.

## 2014-11-26 NOTE — H&P (View-Only) (Signed)
Referring Provider:   Dr. Tyler Pita ([facility]) Primary Care Physician:  Mayra Neer, MD Primary Gastroenterologist:  Dr. Penelope Coop  Reason for Consultation:  Anemia  HPI: Timothy Millin Sr. is a 81 y.o. male with significant COPD who presented to his primary physician's office yesterday with a several week history of progressive dyspnea and weakness, and was found to have a hemoglobin around 7 (on recheck here, 7.8, as compared to 11 2 months earlier). He did not really have any significant upper tract or lower tract symptoms, no visible bleeding.   He is maintained on iron and thus has had dark stools but has not had loose frequent stools to suggest melena. He is on aspirin and Plavix and, although the hospital record indicates that he is on Protonix, the patient and his wife indicate that that medication was stopped some time ago.   He has a previous history of gastroduodenal AVMs, most recently endoscoped and cauterized approximately 3 years ago in October, 2013. I have reviewed those photographs and to me there is not compelling evidence that these were true vascular ectasia, as opposed to small erythematous spots.   The patient has received a 2 unit transfusion since admission. He is having some nonspecific chest discomfort but otherwise is not in distress.  The patient does have a history of a large proximal colonic polyp that was removed by endoscopic mucosal resection at [facility] approximately 4 years ago. A follow-up examination, performed in November 2012 at [facility], did not show any residual polyp tissue at that site, which was near the hepatic flexure. He did incidentally have a couple of small polyps at that time. He does not appear to have had subsequent colonoscopic surveillance.   Past Medical History  Diagnosis Date  . High cholesterol   . Anginal pain   . Myocardial infarction 1987  . Exertional dyspnea 01/04/2012  . History of blood transfusion 1987; 11/25/2014    "emergent CABG; anemia"  . Numbness and tingling in hands 01/04/2012    "right one goes to sleep on steering wheel when I'm driving; been that way long time; @ least 1 yr"  . Thyroid disease   . Coronary artery disease   . COPD (chronic obstructive pulmonary disease)   . Hyperlipidemia   . Complication of anesthesia 1987    "didn't wake up for 5 days"  . Hypothyroidism   . Anemia   . GI bleed due to NSAIDs 02/2012    Past Surgical History  Procedure Laterality Date  . Coronary angioplasty with stent placement  ? 1990's    "3"  . Coronary angioplasty with stent placement  01/04/2012    "2; total of 5"  . Wrist fracture surgery Left ~ 2009  . Wrist hardware removal Left ~ 2010    "took steel bar out"  . Colonoscopy w/ polypectomy  2011    "had to take it out in 3 pieces; all benign"  . Cataract extraction w/ intraocular lens  implant, bilateral Bilateral 2012  . Esophagogastroduodenoscopy  02/07/2012    Procedure: ESOPHAGOGASTRODUODENOSCOPY (EGD);  Surgeon: Wonda Horner, MD;  Location: [facility] ENDOSCOPY;  Service: Endoscopy;  Laterality: N/A;  . Percutaneous coronary stent intervention (pci-s) N/A 01/04/2012    Procedure: PERCUTANEOUS CORONARY STENT INTERVENTION (PCI-S);  Surgeon: Sinclair Grooms, MD;  Location: [facility] CATH LAB;  Service: Cardiovascular;  Laterality: N/A;  . Cardiac catheterization    . Cardiac catheterization N/A 09/10/2014    Procedure: Left Heart Cath and  Cors/Grafts Angiography;  Surgeon: Peter M Martinique, MD;  Location: [facility] INVASIVE CV LAB CUPID;  Service: Cardiovascular;  Laterality: N/A;  . Coronary artery bypass graft  1987    CABG X1    Prior to Admission medications   Medication Sig Start Date End Date Taking? Authorizing Provider  aspirin EC 81 MG tablet Take 81 mg by mouth daily.   Yes Historical Provider, MD  clopidogrel (PLAVIX) 75 MG tablet Take 75 mg by mouth daily.   Yes Historical Provider, MD  ferrous sulfate 325 (65 FE) MG tablet Take 650 mg by mouth  daily with breakfast.   Yes Historical Provider, MD  levothyroxine (SYNTHROID, LEVOTHROID) 50 MCG tablet Take 50 mcg by mouth daily before breakfast.   Yes Historical Provider, MD  Multiple Vitamin (MULTIVITAMIN WITH MINERALS) TABS Take 1 tablet by mouth daily.   Yes Historical Provider, MD  nitroGLYCERIN (NITROSTAT) 0.4 MG SL tablet Place 1 tablet (0.4 mg total) under the tongue every 5 (five) minutes as needed. For chest pain 11/16/14  Yes Jerline Pain, MD  rosuvastatin (CRESTOR) 10 MG tablet Take 10 mg by mouth daily.   Yes Historical Provider, MD  acetaminophen (TYLENOL) 325 MG tablet Take 2 tablets (650 mg total) by mouth every 4 (four) hours as needed for headache or mild pain. 09/10/14   Erlene Quan, PA-C  aspirin 81 MG EC tablet Take 81 mg by mouth daily. 01/13/12   Jacolyn Reedy, MD  clopidogrel (PLAVIX) 75 MG tablet TAKE 1 TABLET BY MOUTH EVERY DAY 09/21/14   Jerline Pain, MD  ferrous sulfate 325 (65 FE) MG tablet Take 650 mg by mouth daily with breakfast.     Historical Provider, MD  levothyroxine (LEVOTHROID) 25 MCG tablet Take 2 tablets (50 mcg total) by mouth daily before breakfast. 12/16/13   Tanda Rockers, MD  levothyroxine (SYNTHROID, LEVOTHROID) 25 MCG tablet Take 50 mcg by mouth daily before breakfast.    Historical Provider, MD  Multiple Vitamins-Minerals (MULTIVITAMIN & MINERAL PO) Take 1 tablet by mouth daily.    Historical Provider, MD  pantoprazole (PROTONIX) 40 MG tablet 1 daily before first meal 04/20/13   Jerline Pain, MD  pantoprazole (PROTONIX) 40 MG tablet Take 40 mg by mouth daily.    Historical Provider, MD  rosuvastatin (CRESTOR) 10 MG tablet Take 1 tablet (10 mg total) by mouth daily. 06/23/14   Jerline Pain, MD    Current Facility-Administered Medications  Medication Dose Route Frequency Provider Last Rate Last Dose  . 0.9 %  sodium chloride infusion   Intravenous Once Ripudeep K Rai, MD      . 0.9 %  sodium chloride infusion   Intravenous Continuous Ripudeep  Krystal Eaton, MD 75 mL/hr at 11/26/14 0719    . acetaminophen (TYLENOL) tablet 650 mg  650 mg Oral Q6H PRN Ripudeep Krystal Eaton, MD       Or  . acetaminophen (TYLENOL) suppository 650 mg  650 mg Rectal Q6H PRN Ripudeep K Rai, MD      . alum & mag hydroxide-simeth (MAALOX/MYLANTA) 200-200-20 MG/5ML suspension 30 mL  30 mL Oral Q6H PRN Ripudeep K Rai, MD      . levothyroxine (SYNTHROID, LEVOTHROID) tablet 50 mcg  50 mcg Oral QAC breakfast Ripudeep K Rai, MD      . multivitamin with minerals tablet 1 tablet  1 tablet Oral Daily Ripudeep K Rai, MD      . nitroGLYCERIN (NITROSTAT) SL tablet 0.4 mg  0.4 mg Sublingual Q5 min PRN Ripudeep K Rai, MD      . ondansetron (ZOFRAN) tablet 4 mg  4 mg Oral Q6H PRN Ripudeep K Rai, MD       Or  . ondansetron (ZOFRAN) injection 4 mg  4 mg Intravenous Q6H PRN Ripudeep K Rai, MD      . pantoprazole (PROTONIX) injection 40 mg  40 mg Intravenous Q12H Ripudeep Krystal Eaton, MD   40 mg at 11/25/14 2112  . rosuvastatin (CRESTOR) tablet 10 mg  10 mg Oral q1800 Ripudeep Krystal Eaton, MD        Allergies as of 11/25/2014 - Review Complete 11/25/2014  Allergen Reaction Noted  . Ibuprofen Other (See Comments) 09/09/2014  . Morphine and related Nausea And Vomiting 09/09/2014  . Atorvastatin  11/25/2014    Family History  Problem Relation Age of Onset  . Emphysema Father     ? if he smoked or not     History   Social History  . Marital Status: Married    Spouse Name: N/A  . Number of Children: N/A  . Years of Education: N/A   Occupational History  . Not on file.   Social History Main Topics  . Smoking status: Former Smoker -- 1.00 packs/day for 40 years    Types: Cigarettes    Quit date: 05/08/1988  . Smokeless tobacco: Never Used  . Alcohol Use: No     Comment: 11/25/2014 "hadn't drank any alcohol in the 2000's"  . Drug Use: No  . Sexual Activity: Yes   Other Topics Concern  . Not on file   Social History Narrative   ** Merged History Encounter **        Review of  Systems: The patient indicates he is not on oxygen at home and is able to walk short distances before becoming short of breath, at baseline. This sounds like it's on the order of 100 feet.  Physical Exam: Vital signs in last 24 hours: Temp:  [97.8 F (36.6 C)-98.7 F (37.1 C)] 97.9 F (36.6 C) (07/21 0931) Pulse Rate:  [51-138] 71 (07/21 0931) Resp:  [13-27] 16 (07/21 0931) BP: (98-136)/(47-73) 123/52 mmHg (07/21 0931) SpO2:  [90 %-99 %] 94 % (07/21 0931) Weight:  [63.504 kg (140 lb)] 63.504 kg (140 lb) (07/20 1316) Last BM Date: 11/25/14 General:   Alert,  Well-developed, well-nourished, pleasant and cooperative in NAD Head:  Normocephalic and atraumatic. Eyes:  Sclera clear, no icterus.   Conjunctiva pink. Mouth:   No ulcerations or lesions.  Oropharynx pink & moist. Neck:   No masses or thyromegaly. Lungs:  Diminished breath sounds consistent with diagnosis of COPD, but no wheezes, no respiratory distress, no use of accessory muscles of respiration. Heart:   Regular rate and rhythm; no murmurs, clicks, rubs,  or gallops. Split S2. Abdomen:  Soft, nontender, nontympanitic, and nondistended. No masses, hepatosplenomegaly or ventral hernias noted. Normal bowel sounds, without bruits, guarding, or rebound.   Rectal:  The stool is black, consistent with iron therapy. A specimen was sent to the lab for hemeoccult analysis. It did not look or smell like classic melena.   Msk:   Symmetrical without gross deformities. Pulses:  The radial pulses very full. Extremities:   Without clubbing, cyanosis, or edema. Neurologic:  Alert and coherent;  grossly normal neurologically. Skin:  Intact without significant lesions or rashes. Cervical Nodes:  No significant cervical adenopathy. Psych:   Alert and cooperative. Normal mood and affect.  Intake/Output  from previous day: 07/20 0701 - 07/21 0700 In: 1170 [I.V.:500; Blood:670] Out: 0  Intake/Output this shift:    Lab Results:  Recent Labs   11/25/14 1325 11/25/14 1834 11/26/14 0507  WBC 6.2  --  7.4  HGB 7.8* 7.2* 9.9*  HCT 24.5* 22.8* 30.6*  PLT 258  --  226   BMET  Recent Labs  11/25/14 1325 11/26/14 0507  NA 140 141  K 3.9 3.9  CL 108 109  CO2 26 24  GLUCOSE 95 81  BUN 15 13  CREATININE 1.15 1.05  CALCIUM 9.0 8.5*   LFT  Recent Labs  11/25/14 1325  PROT 6.2*  ALBUMIN 3.7  AST 18  ALT 12*  ALKPHOS 49  BILITOT 0.5   PT/INR  Recent Labs  11/25/14 1325  LABPROT 14.2  INR 1.08    Studies/Results: No results found.  Impression: Anemia, presumably related to GI tract blood loss. The patient's BUN was normal on admission, suggesting the absence of recent upper tract bleeding. Moreover, his stool does not currently look melenic. I wonder about aspirin-induced gastropathy, especially if it is true that he has not been using Protonix while on his aspirin and Plavix. Other possibilities would be small bowel vascular ectasia. I doubt colonic neoplasia since his most recent colonoscopy, done at [facility] in November 2012, was negative for any significant polyps (2 small polyps were found, no residual tissue at his previous polypectomy site).  Plan: Await hemoccult result. Consider endoscopic evaluation either this afternoon or tomorrow. This might be most safely accomplished using minimal sedation and the pediatric endoscope, taking into account the patient's significant COPD. I have discussed the case with my partner who is the main hospital doctor this week, and he will make appropriate arrangements for follow through on this decision about whether and when to perform endoscopy. The patient is currently nothing by mouth.   LOS: 1 day   Brodey Bonn V  11/26/2014, 9:53 AM   Pager [phone] If no answer or after 5 PM call [phone]

## 2014-11-26 NOTE — Progress Notes (Signed)
[facility] PROGRESS NOTE  TYHEIM VANALSTYNE Sr. KDT:[id number] DOB: [birth year] DOA: 11/25/2014 PCP: Mayra Neer, MD  Assessment/Plan: Principal Problem:   GI bleed Active Problems:   CAD   COPD GOLD II   Hypothyroidism    Acute blood loss-EGD negative per Dr. Paulita Fujita, no further GI workup indicated in the hospital, patient is to follow-up with Dr. Penelope Coop Status post transfusion of 2 units of packed red blood cells If patient's hemoglobin is stable we'll discharge him in the morning Continue PPI, okay to resume Plavix per Dr. Paulita Fujita Anticipate discharge tomorrow if CBC stable  CAD - Currently no chest pain, hold aspirin and resume Plavix due to #1   COPD GOLD II - Currently stable, no wheezing   Hypothyroidism - Continue Synthroid, follow TSH      Code Status:      Code Status Orders        Start     Ordered   11/25/14 1729  Full code   Continuous     11/25/14 1728     Family Communication: family updated about patient's clinical progress Disposition Plan:  As above    Brief narrative: JOSETH WEIGEL Sr. is a 81 y.o. male with significant COPD who presented to his primary 27 office yesterday with a several week history of progressive dyspnea and weakness, and was found to have a hemoglobin around 7 (on recheck here, 7.8, as compared to 11 2 months earlier). He did not really have any significant upper tract or lower tract symptoms, no visible bleeding.   He is maintained on iron and thus has had dark stools but has not had loose frequent stools to suggest melena. He is on aspirin and Plavix and, although the hospital record indicates that he is on Protonix, the patient and his wife indicate that that medication was stopped some time ago.   He has a previous history of gastroduodenal AVMs, most recently endoscoped and cauterized approximately 3 years ago in October, 2013. I have reviewed those photographs and to me there is not compelling  evidence that these were true vascular ectasia, as opposed to small erythematous spots.   The patient has received a 2 unit transfusion since admission. He is having some nonspecific chest discomfort but otherwise is not in distress.  The patient does have a history of a large proximal colonic polyp that was removed by endoscopic mucosal resection at [facility] approximately 4 years ago. A follow-up examination, performed in November 2012 at [facility], did not show any residual polyp tissue at that site, which was near the hepatic flexure. He did incidentally have a couple of small polyps at that time. He does not appear to have had subsequent colonoscopic surveillance.  Consultants:  Gastroenterology  Procedures:  EGD negative  Antibiotics: Anti-infectives    None         HPI/Subjective: Denies any melena, no nausea , no AP   Objective: Filed Vitals:   11/26/14 1135 11/26/14 1140 11/26/14 1145 11/26/14 1150  BP: 126/61 128/60 116/63 129/56  Pulse: 70  67 67  Temp:      TempSrc:      Resp: '15  15 15  '$ Height:      Weight:      SpO2: 93%  93% 93%    Intake/Output Summary (Last 24 hours) at 11/26/14 1219 Last data filed at 11/26/14 0932  Gross per 24 hour  Intake   1170 ml  Output  0 ml  Net   1170 ml    Exam:  General: No acute respiratory distress Lungs: Clear to auscultation bilaterally without wheezes or crackles Cardiovascular: Regular rate and rhythm without murmur gallop or rub normal S1 and S2 Abdomen: Nontender, nondistended, soft, bowel sounds positive, no rebound, no ascites, no appreciable mass Extremities: No significant cyanosis, clubbing, or edema bilateral lower extremities     Data Review   Micro Results No results found for this or any previous visit (from the past 240 hour(s)).  Radiology Reports No results found.   CBC  Recent Labs Lab 11/25/14 1325 11/25/14 1834 11/26/14 0507 11/26/14 1002  WBC 6.2  --  7.4  --   HGB  7.8* 7.2* 9.9* 9.9*  HCT 24.5* 22.8* 30.6* 30.7*  PLT 258  --  226  --   MCV 96.8  --  94.2  --   MCH 30.8  --  30.5  --   MCHC 31.8  --  32.4  --   RDW 15.8*  --  16.0*  --   LYMPHSABS 1.0  --   --   --   MONOABS 0.8  --   --   --   EOSABS 0.1  --   --   --   BASOSABS 0.0  --   --   --     Chemistries   Recent Labs Lab 11/25/14 1325 11/26/14 0507  NA 140 141  K 3.9 3.9  CL 108 109  CO2 26 24  GLUCOSE 95 81  BUN 15 13  CREATININE 1.15 1.05  CALCIUM 9.0 8.5*  AST 18  --   ALT 12*  --   ALKPHOS 49  --   BILITOT 0.5  --    ------------------------------------------------------------------------------------------------------------------ estimated creatinine clearance is 51.2 mL/min (by C-G formula based on Cr of 1.05). ------------------------------------------------------------------------------------------------------------------ No results for input(s): HGBA1C in the last 72 hours. ------------------------------------------------------------------------------------------------------------------ No results for input(s): CHOL, HDL, LDLCALC, TRIG, CHOLHDL, LDLDIRECT in the last 72 hours. ------------------------------------------------------------------------------------------------------------------  Recent Labs  11/25/14 1834  TSH 4.890*   ------------------------------------------------------------------------------------------------------------------ No results for input(s): VITAMINB12, FOLATE, FERRITIN, TIBC, IRON, RETICCTPCT in the last 72 hours.  Coagulation profile  Recent Labs Lab 11/25/14 1325  INR 1.08    No results for input(s): DDIMER in the last 72 hours.  Cardiac Enzymes No results for input(s): CKMB, TROPONINI, MYOGLOBIN in the last 168 hours.  Invalid input(s): CK ------------------------------------------------------------------------------------------------------------------ Invalid input(s): POCBNP   CBG: No results for input(s):  GLUCAP in the last 168 hours.     Studies: No results found.    Lab Results  Component Value Date   HGBA1C 5.5 09/09/2014   Lab Results  Component Value Date   LDLCALC 55 09/10/2014   CREATININE 1.05 11/26/2014       Scheduled Meds: . sodium chloride   Intravenous Once  . levothyroxine  50 mcg Oral QAC breakfast  . multivitamin with minerals  1 tablet Oral Daily  . pantoprazole (PROTONIX) IV  40 mg Intravenous Q12H  . rosuvastatin  10 mg Oral q1800   Continuous Infusions: . sodium chloride 75 mL/hr at 11/26/14 0719    Principal Problem:   GI bleed Active Problems:   CAD   COPD GOLD II   Hypothyroidism    Time spent: 45 minutes   [facility] Pager [phone]. If 7PM-7AM, please contact night-coverage at [email or web address], password [email or web address] 11/26/2014, 12:19 PM  LOS: 1 day

## 2014-11-26 NOTE — Consult Note (Signed)
Referring Provider:   Dr. Tyler Pita ([facility]) Primary Care Physician:  Mayra Neer, MD Primary Gastroenterologist:  Dr. Penelope Coop  Reason for Consultation:  Anemia  HPI: Timothy Millin Sr. is a 81 y.o. male with significant COPD who presented to his primary physician's office yesterday with a several week history of progressive dyspnea and weakness, and was found to have a hemoglobin around 7 (on recheck here, 7.8, as compared to 11 2 months earlier). He did not really have any significant upper tract or lower tract symptoms, no visible bleeding.   He is maintained on iron and thus has had dark stools but has not had loose frequent stools to suggest melena. He is on aspirin and Plavix and, although the hospital record indicates that he is on Protonix, the patient and his wife indicate that that medication was stopped some time ago.   He has a previous history of gastroduodenal AVMs, most recently endoscoped and cauterized approximately 3 years ago in October, 2013. I have reviewed those photographs and to me there is not compelling evidence that these were true vascular ectasia, as opposed to small erythematous spots.   The patient has received a 2 unit transfusion since admission. He is having some nonspecific chest discomfort but otherwise is not in distress.  The patient does have a history of a large proximal colonic polyp that was removed by endoscopic mucosal resection at [facility] approximately 4 years ago. A follow-up examination, performed in November 2012 at [facility], did not show any residual polyp tissue at that site, which was near the hepatic flexure. He did incidentally have a couple of small polyps at that time. He does not appear to have had subsequent colonoscopic surveillance.   Past Medical History  Diagnosis Date  . High cholesterol   . Anginal pain   . Myocardial infarction 1987  . Exertional dyspnea 01/04/2012  . History of blood transfusion 1987; 11/25/2014    "emergent CABG; anemia"  . Numbness and tingling in hands 01/04/2012    "right one goes to sleep on steering wheel when I'm driving; been that way long time; @ least 1 yr"  . Thyroid disease   . Coronary artery disease   . COPD (chronic obstructive pulmonary disease)   . Hyperlipidemia   . Complication of anesthesia 1987    "didn't wake up for 5 days"  . Hypothyroidism   . Anemia   . GI bleed due to NSAIDs 02/2012    Past Surgical History  Procedure Laterality Date  . Coronary angioplasty with stent placement  ? 1990's    "3"  . Coronary angioplasty with stent placement  01/04/2012    "2; total of 5"  . Wrist fracture surgery Left ~ 2009  . Wrist hardware removal Left ~ 2010    "took steel bar out"  . Colonoscopy w/ polypectomy  2011    "had to take it out in 3 pieces; all benign"  . Cataract extraction w/ intraocular lens  implant, bilateral Bilateral 2012  . Esophagogastroduodenoscopy  02/07/2012    Procedure: ESOPHAGOGASTRODUODENOSCOPY (EGD);  Surgeon: Wonda Horner, MD;  Location: [facility] ENDOSCOPY;  Service: Endoscopy;  Laterality: N/A;  . Percutaneous coronary stent intervention (pci-s) N/A 01/04/2012    Procedure: PERCUTANEOUS CORONARY STENT INTERVENTION (PCI-S);  Surgeon: Sinclair Grooms, MD;  Location: [facility] CATH LAB;  Service: Cardiovascular;  Laterality: N/A;  . Cardiac catheterization    . Cardiac catheterization N/A 09/10/2014    Procedure: Left Heart Cath and  Cors/Grafts Angiography;  Surgeon: Peter M Martinique, MD;  Location: [facility] INVASIVE CV LAB CUPID;  Service: Cardiovascular;  Laterality: N/A;  . Coronary artery bypass graft  1987    CABG X1    Prior to Admission medications   Medication Sig Start Date End Date Taking? Authorizing Provider  aspirin EC 81 MG tablet Take 81 mg by mouth daily.   Yes Historical Provider, MD  clopidogrel (PLAVIX) 75 MG tablet Take 75 mg by mouth daily.   Yes Historical Provider, MD  ferrous sulfate 325 (65 FE) MG tablet Take 650 mg by mouth  daily with breakfast.   Yes Historical Provider, MD  levothyroxine (SYNTHROID, LEVOTHROID) 50 MCG tablet Take 50 mcg by mouth daily before breakfast.   Yes Historical Provider, MD  Multiple Vitamin (MULTIVITAMIN WITH MINERALS) TABS Take 1 tablet by mouth daily.   Yes Historical Provider, MD  nitroGLYCERIN (NITROSTAT) 0.4 MG SL tablet Place 1 tablet (0.4 mg total) under the tongue every 5 (five) minutes as needed. For chest pain 11/16/14  Yes Jerline Pain, MD  rosuvastatin (CRESTOR) 10 MG tablet Take 10 mg by mouth daily.   Yes Historical Provider, MD  acetaminophen (TYLENOL) 325 MG tablet Take 2 tablets (650 mg total) by mouth every 4 (four) hours as needed for headache or mild pain. 09/10/14   Erlene Quan, PA-C  aspirin 81 MG EC tablet Take 81 mg by mouth daily. 01/13/12   Jacolyn Reedy, MD  clopidogrel (PLAVIX) 75 MG tablet TAKE 1 TABLET BY MOUTH EVERY DAY 09/21/14   Jerline Pain, MD  ferrous sulfate 325 (65 FE) MG tablet Take 650 mg by mouth daily with breakfast.     Historical Provider, MD  levothyroxine (LEVOTHROID) 25 MCG tablet Take 2 tablets (50 mcg total) by mouth daily before breakfast. 12/16/13   Tanda Rockers, MD  levothyroxine (SYNTHROID, LEVOTHROID) 25 MCG tablet Take 50 mcg by mouth daily before breakfast.    Historical Provider, MD  Multiple Vitamins-Minerals (MULTIVITAMIN & MINERAL PO) Take 1 tablet by mouth daily.    Historical Provider, MD  pantoprazole (PROTONIX) 40 MG tablet 1 daily before first meal 04/20/13   Jerline Pain, MD  pantoprazole (PROTONIX) 40 MG tablet Take 40 mg by mouth daily.    Historical Provider, MD  rosuvastatin (CRESTOR) 10 MG tablet Take 1 tablet (10 mg total) by mouth daily. 06/23/14   Jerline Pain, MD    Current Facility-Administered Medications  Medication Dose Route Frequency Provider Last Rate Last Dose  . 0.9 %  sodium chloride infusion   Intravenous Once Ripudeep K Rai, MD      . 0.9 %  sodium chloride infusion   Intravenous Continuous Ripudeep  Krystal Eaton, MD 75 mL/hr at 11/26/14 0719    . acetaminophen (TYLENOL) tablet 650 mg  650 mg Oral Q6H PRN Ripudeep Krystal Eaton, MD       Or  . acetaminophen (TYLENOL) suppository 650 mg  650 mg Rectal Q6H PRN Ripudeep K Rai, MD      . alum & mag hydroxide-simeth (MAALOX/MYLANTA) 200-200-20 MG/5ML suspension 30 mL  30 mL Oral Q6H PRN Ripudeep K Rai, MD      . levothyroxine (SYNTHROID, LEVOTHROID) tablet 50 mcg  50 mcg Oral QAC breakfast Ripudeep K Rai, MD      . multivitamin with minerals tablet 1 tablet  1 tablet Oral Daily Ripudeep K Rai, MD      . nitroGLYCERIN (NITROSTAT) SL tablet 0.4 mg  0.4 mg Sublingual Q5 min PRN Ripudeep K Rai, MD      . ondansetron (ZOFRAN) tablet 4 mg  4 mg Oral Q6H PRN Ripudeep K Rai, MD       Or  . ondansetron (ZOFRAN) injection 4 mg  4 mg Intravenous Q6H PRN Ripudeep K Rai, MD      . pantoprazole (PROTONIX) injection 40 mg  40 mg Intravenous Q12H Ripudeep Krystal Eaton, MD   40 mg at 11/25/14 2112  . rosuvastatin (CRESTOR) tablet 10 mg  10 mg Oral q1800 Ripudeep Krystal Eaton, MD        Allergies as of 11/25/2014 - Review Complete 11/25/2014  Allergen Reaction Noted  . Ibuprofen Other (See Comments) 09/09/2014  . Morphine and related Nausea And Vomiting 09/09/2014  . Atorvastatin  11/25/2014    Family History  Problem Relation Age of Onset  . Emphysema Father     ? if he smoked or not     History   Social History  . Marital Status: Married    Spouse Name: N/A  . Number of Children: N/A  . Years of Education: N/A   Occupational History  . Not on file.   Social History Main Topics  . Smoking status: Former Smoker -- 1.00 packs/day for 40 years    Types: Cigarettes    Quit date: 05/08/1988  . Smokeless tobacco: Never Used  . Alcohol Use: No     Comment: 11/25/2014 "hadn't drank any alcohol in the 2000's"  . Drug Use: No  . Sexual Activity: Yes   Other Topics Concern  . Not on file   Social History Narrative   ** Merged History Encounter **        Review of  Systems: The patient indicates he is not on oxygen at home and is able to walk short distances before becoming short of breath, at baseline. This sounds like it's on the order of 100 feet.  Physical Exam: Vital signs in last 24 hours: Temp:  [97.8 F (36.6 C)-98.7 F (37.1 C)] 97.9 F (36.6 C) (07/21 0931) Pulse Rate:  [51-138] 71 (07/21 0931) Resp:  [13-27] 16 (07/21 0931) BP: (98-136)/(47-73) 123/52 mmHg (07/21 0931) SpO2:  [90 %-99 %] 94 % (07/21 0931) Weight:  [63.504 kg (140 lb)] 63.504 kg (140 lb) (07/20 1316) Last BM Date: 11/25/14 General:   Alert,  Well-developed, well-nourished, pleasant and cooperative in NAD Head:  Normocephalic and atraumatic. Eyes:  Sclera clear, no icterus.   Conjunctiva pink. Mouth:   No ulcerations or lesions.  Oropharynx pink & moist. Neck:   No masses or thyromegaly. Lungs:  Diminished breath sounds consistent with diagnosis of COPD, but no wheezes, no respiratory distress, no use of accessory muscles of respiration. Heart:   Regular rate and rhythm; no murmurs, clicks, rubs,  or gallops. Split S2. Abdomen:  Soft, nontender, nontympanitic, and nondistended. No masses, hepatosplenomegaly or ventral hernias noted. Normal bowel sounds, without bruits, guarding, or rebound.   Rectal:  The stool is black, consistent with iron therapy. A specimen was sent to the lab for hemeoccult analysis. It did not look or smell like classic melena.   Msk:   Symmetrical without gross deformities. Pulses:  The radial pulses very full. Extremities:   Without clubbing, cyanosis, or edema. Neurologic:  Alert and coherent;  grossly normal neurologically. Skin:  Intact without significant lesions or rashes. Cervical Nodes:  No significant cervical adenopathy. Psych:   Alert and cooperative. Normal mood and affect.  Intake/Output  from previous day: 07/20 0701 - 07/21 0700 In: 1170 [I.V.:500; Blood:670] Out: 0  Intake/Output this shift:    Lab Results:  Recent Labs   11/25/14 1325 11/25/14 1834 11/26/14 0507  WBC 6.2  --  7.4  HGB 7.8* 7.2* 9.9*  HCT 24.5* 22.8* 30.6*  PLT 258  --  226   BMET  Recent Labs  11/25/14 1325 11/26/14 0507  NA 140 141  K 3.9 3.9  CL 108 109  CO2 26 24  GLUCOSE 95 81  BUN 15 13  CREATININE 1.15 1.05  CALCIUM 9.0 8.5*   LFT  Recent Labs  11/25/14 1325  PROT 6.2*  ALBUMIN 3.7  AST 18  ALT 12*  ALKPHOS 49  BILITOT 0.5   PT/INR  Recent Labs  11/25/14 1325  LABPROT 14.2  INR 1.08    Studies/Results: No results found.  Impression: Anemia, presumably related to GI tract blood loss. The patient's BUN was normal on admission, suggesting the absence of recent upper tract bleeding. Moreover, his stool does not currently look melenic. I wonder about aspirin-induced gastropathy, especially if it is true that he has not been using Protonix while on his aspirin and Plavix. Other possibilities would be small bowel vascular ectasia. I doubt colonic neoplasia since his most recent colonoscopy, done at [facility] in November 2012, was negative for any significant polyps (2 small polyps were found, no residual tissue at his previous polypectomy site).  Plan: Await hemoccult result. Consider endoscopic evaluation either this afternoon or tomorrow. This might be most safely accomplished using minimal sedation and the pediatric endoscope, taking into account the patient's significant COPD. I have discussed the case with my partner who is the main hospital doctor this week, and he will make appropriate arrangements for follow through on this decision about whether and when to perform endoscopy. The patient is currently nothing by mouth.   LOS: 1 day   Valdez Brannan V  11/26/2014, 9:53 AM   Pager [phone] If no answer or after 5 PM call [phone]

## 2014-11-27 ENCOUNTER — Encounter (HOSPITAL_COMMUNITY): Payer: Self-pay | Admitting: Gastroenterology

## 2014-11-27 LAB — COMPREHENSIVE METABOLIC PANEL
ALBUMIN: 2.9 g/dL — AB (ref 3.5–5.0)
ALT: 10 U/L — ABNORMAL LOW (ref 17–63)
AST: 17 U/L (ref 15–41)
Alkaline Phosphatase: 42 U/L (ref 38–126)
Anion gap: 10 (ref 5–15)
BUN: 6 mg/dL (ref 6–20)
CHLORIDE: 109 mmol/L (ref 101–111)
CO2: 23 mmol/L (ref 22–32)
Calcium: 8.3 mg/dL — ABNORMAL LOW (ref 8.9–10.3)
Creatinine, Ser: 0.86 mg/dL (ref 0.61–1.24)
GFR calc Af Amer: >60 mL/min (ref >60)
GFR calc non Af Amer: >60 mL/min (ref >60)
Glucose, Bld: 90 mg/dL (ref 65–99)
POTASSIUM: 3.6 mmol/L (ref 3.5–5.1)
SODIUM: 142 mmol/L (ref 135–145)
TOTAL PROTEIN: 5.6 g/dL — AB (ref 6.5–8.1)
Total Bilirubin: 0.8 mg/dL (ref 0.3–1.2)

## 2014-11-27 LAB — CBC
HEMATOCRIT: 30.5 % — AB (ref 39.0–52.0)
Hemoglobin: 10.1 g/dL — ABNORMAL LOW (ref 13.0–17.0)
MCH: 31.7 pg (ref 26.0–34.0)
MCHC: 33.1 g/dL (ref 30.0–36.0)
MCV: 95.6 fL (ref 78.0–100.0)
Platelets: 217 10*3/uL (ref 150–400)
RBC: 3.19 MIL/uL — AB (ref 4.22–5.81)
RDW: 15.7 % — ABNORMAL HIGH (ref 11.5–15.5)
WBC: 8.2 10*3/uL (ref 4.0–10.5)

## 2014-11-27 MED ORDER — ASPIRIN 81 MG PO TBEC: 81.0000 mg | DELAYED_RELEASE_TABLET | Freq: Every day | ORAL | Status: DC

## 2014-11-27 NOTE — Progress Notes (Signed)
11/27/2014 9:10 AM  Timothy Millin Sr. to be D/C'd Home per MD order.  Discussed prescriptions and follow up appointments with the patient. Prescriptions given to patient, medication list explained in detail. Pt verbalized understanding.    Medication List    STOP taking these medications        MULTIVITAMIN & MINERAL PO      TAKE these medications        acetaminophen 325 MG tablet  Commonly known as:  TYLENOL  Take 2 tablets (650 mg total) by mouth every 4 (four) hours as needed for headache or mild pain.     aspirin 81 MG EC tablet  Take 1 tablet (81 mg total) by mouth daily.  Start taking on:  12/07/2014     clopidogrel 75 MG tablet  Commonly known as:  PLAVIX  Take 75 mg by mouth daily.     ferrous sulfate 325 (65 FE) MG tablet  Take 650 mg by mouth daily with breakfast.     levothyroxine 50 MCG tablet  Commonly known as:  SYNTHROID, LEVOTHROID  Take 50 mcg by mouth daily before breakfast.     multivitamin with minerals Tabs tablet  Take 1 tablet by mouth daily.     nitroGLYCERIN 0.4 MG SL tablet  Commonly known as:  NITROSTAT  Place 1 tablet (0.4 mg total) under the tongue every 5 (five) minutes as needed. For chest pain     pantoprazole 40 MG tablet  Commonly known as:  PROTONIX  Take 40 mg by mouth daily.     rosuvastatin 10 MG tablet  Commonly known as:  CRESTOR  Take 10 mg by mouth daily.        Filed Vitals:   11/27/14 0500  BP: 105/53  Pulse: 62  Temp: 97.7 F (36.5 C)  Resp: 16    Skin clean, dry and intact without evidence of skin break down, no evidence of skin tears noted. IV catheter discontinued intact. Site without signs and symptoms of complications. Dressing and pressure applied. Pt denies pain at this time. No complaints noted.  An After Visit Summary was printed and given to the patient. Patient escorted via [place], and D/C home via private auto.  [employer], RN-BC, [employer] [facility] [address] Phone [phone]

## 2014-11-27 NOTE — Discharge Summary (Signed)
Physician Discharge Summary  Timothy Millin Sr. MRN: [id number] DOB/AGE: [birth year] 79 y.o.  PCP: Mayra Neer, MD   Admit date: 11/25/2014 Discharge date: 11/27/2014  Discharge Diagnoses:     Principal Problem:   GI bleed Active Problems:   CAD   COPD GOLD II   Hypothyroidism  acute blood loss anemia   Follow-up recommendations Follow-up with PCP in 3-5 days , including although additional recommended appointments as below Follow-up CBC, CMP in 3-5 days Patient to follow-up with gastroenterology Dr. Penelope Coop in 1-2 weeks     Medication List    STOP taking these medications        MULTIVITAMIN & MINERAL PO      TAKE these medications        acetaminophen 325 MG tablet  Commonly known as:  TYLENOL  Take 2 tablets (650 mg total) by mouth every 4 (four) hours as needed for headache or mild pain.     aspirin 81 MG EC tablet  Take 1 tablet (81 mg total) by mouth daily.  Start taking on:  12/07/2014     clopidogrel 75 MG tablet  Commonly known as:  PLAVIX  Take 75 mg by mouth daily.     ferrous sulfate 325 (65 FE) MG tablet  Take 650 mg by mouth daily with breakfast.     levothyroxine 50 MCG tablet  Commonly known as:  SYNTHROID, LEVOTHROID  Take 50 mcg by mouth daily before breakfast.     multivitamin with minerals Tabs tablet  Take 1 tablet by mouth daily.     nitroGLYCERIN 0.4 MG SL tablet  Commonly known as:  NITROSTAT  Place 1 tablet (0.4 mg total) under the tongue every 5 (five) minutes as needed. For chest pain     pantoprazole 40 MG tablet  Commonly known as:  PROTONIX  Take 40 mg by mouth daily.     rosuvastatin 10 MG tablet  Commonly known as:  CRESTOR  Take 10 mg by mouth daily.         Discharge Condition: Stable  Disposition: 01-Home or Self Care   Consults:  Gastroenterology     [facility] Weights   11/25/14 1316 11/26/14 2100  Weight: 63.504 kg (140 lb) 63.8 kg (140 lb 10.5 oz)     Microbiology:      Blood  Culture     Labs: Results for orders placed or performed during the hospital encounter of 11/25/14 (from the past 48 hour(s))  CBC with Differential/Platelet     Status: Abnormal   Collection Time: 11/25/14  1:25 PM  Result Value Ref Range   WBC 6.2 4.0 - 10.5 K/uL   RBC 2.53 (L) 4.22 - 5.81 MIL/uL   Hemoglobin 7.8 (L) 13.0 - 17.0 g/dL   HCT 24.5 (L) 39.0 - 52.0 %   MCV 96.8 78.0 - 100.0 fL   MCH 30.8 26.0 - 34.0 pg   MCHC 31.8 30.0 - 36.0 g/dL   RDW 15.8 (H) 11.5 - 15.5 %   Platelets 258 150 - 400 K/uL   Neutrophils Relative % 69 43 - 77 %   Neutro Abs 4.3 1.7 - 7.7 K/uL   Lymphocytes Relative 16 12 - 46 %   Lymphs Abs 1.0 0.7 - 4.0 K/uL   Monocytes Relative 13 (H) 3 - 12 %   Monocytes Absolute 0.8 0.1 - 1.0 K/uL   Eosinophils Relative 1 0 - 5 %   Eosinophils Absolute 0.1 0.0 - 0.7 K/uL   Basophils Relative  1 0 - 1 %   Basophils Absolute 0.0 0.0 - 0.1 K/uL  Comprehensive metabolic panel     Status: Abnormal   Collection Time: 11/25/14  1:25 PM  Result Value Ref Range   Sodium 140 135 - 145 mmol/L   Potassium 3.9 3.5 - 5.1 mmol/L   Chloride 108 101 - 111 mmol/L   CO2 26 22 - 32 mmol/L   Glucose, Bld 95 65 - 99 mg/dL   BUN 15 6 - 20 mg/dL   Creatinine, Ser 1.15 0.61 - 1.24 mg/dL   Calcium 9.0 8.9 - 10.3 mg/dL   Total Protein 6.2 (L) 6.5 - 8.1 g/dL   Albumin 3.7 3.5 - 5.0 g/dL   AST 18 15 - 41 U/L   ALT 12 (L) 17 - 63 U/L   Alkaline Phosphatase 49 38 - 126 U/L   Total Bilirubin 0.5 0.3 - 1.2 mg/dL   GFR calc non Af Amer 59 (L) >60 mL/min   GFR calc Af Amer >60 >60 mL/min    Comment: (NOTE) The eGFR has been calculated using the CKD EPI equation. This calculation has not been validated in all clinical situations. eGFR's persistently <60 mL/min signify possible Chronic Kidney Disease.    Anion gap 6 5 - 15  Protime-INR     Status: [name]   Collection Time: 11/25/14  1:25 PM  Result Value Ref Range   Prothrombin Time 14.2 11.6 - 15.2 seconds   INR 1.08 0.00 - 1.49   Type and screen     Status: [name]   Collection Time: 11/25/14  1:25 PM  Result Value Ref Range   ABO/RH(D) A POS    Antibody Screen NEG    Sample Expiration 11/28/2014    Unit Number [id number]    Blood Component Type RED CELLS,LR    Unit division 00    Status of Unit ISSUED,FINAL    Transfusion Status OK TO TRANSFUSE    Crossmatch Result Compatible    Unit Number [id number]    Blood Component Type RED CELLS,LR    Unit division 00    Status of Unit ISSUED,FINAL    Transfusion Status OK TO TRANSFUSE    Crossmatch Result Compatible   Prepare RBC     Status: [name]   Collection Time: 11/25/14  4:28 PM  Result Value Ref Range   Order Confirmation ORDER PROCESSED BY BLOOD BANK   TSH     Status: Abnormal   Collection Time: 11/25/14  6:34 PM  Result Value Ref Range   TSH 4.890 (H) 0.350 - 4.500 uIU/mL  Hemoglobin and hematocrit, blood     Status: Abnormal   Collection Time: 11/25/14  6:34 PM  Result Value Ref Range   Hemoglobin 7.2 (L) 13.0 - 17.0 g/dL   HCT 22.8 (L) 39.0 - 17.0 %  Basic metabolic panel     Status: Abnormal   Collection Time: 11/26/14  5:07 AM  Result Value Ref Range   Sodium 141 135 - 145 mmol/L   Potassium 3.9 3.5 - 5.1 mmol/L   Chloride 109 101 - 111 mmol/L   CO2 24 22 - 32 mmol/L   Glucose, Bld 81 65 - 99 mg/dL   BUN 13 6 - 20 mg/dL   Creatinine, Ser 1.05 0.61 - 1.24 mg/dL   Calcium 8.5 (L) 8.9 - 10.3 mg/dL   GFR calc non Af Amer >60 >60 mL/min   GFR calc Af Amer >60 >60 mL/min    Comment: (NOTE) The eGFR  has been calculated using the CKD EPI equation. This calculation has not been validated in all clinical situations. eGFR's persistently <60 mL/min signify possible Chronic Kidney Disease.    Anion gap 8 5 - 15  CBC     Status: Abnormal   Collection Time: 11/26/14  5:07 AM  Result Value Ref Range   WBC 7.4 4.0 - 10.5 K/uL   RBC 3.25 (L) 4.22 - 5.81 MIL/uL   Hemoglobin 9.9 (L) 13.0 - 17.0 g/dL    Comment: DELTA CHECK NOTED POST TRANSFUSION  SPECIMEN    HCT 30.6 (L) 39.0 - 52.0 %   MCV 94.2 78.0 - 100.0 fL   MCH 30.5 26.0 - 34.0 pg   MCHC 32.4 30.0 - 36.0 g/dL   RDW 16.0 (H) 11.5 - 15.5 %   Platelets 226 150 - 400 K/uL  Hemoglobin and hematocrit, blood     Status: Abnormal   Collection Time: 11/26/14 10:02 AM  Result Value Ref Range   Hemoglobin 9.9 (L) 13.0 - 17.0 g/dL   HCT 30.7 (L) 39.0 - 52.0 %  Occult blood card to lab, stool     Status: Abnormal   Collection Time: 11/26/14 10:03 AM  Result Value Ref Range   Fecal Occult Bld POSITIVE (A) NEGATIVE  CBC     Status: Abnormal   Collection Time: 11/27/14  5:27 AM  Result Value Ref Range   WBC 8.2 4.0 - 10.5 K/uL   RBC 3.19 (L) 4.22 - 5.81 MIL/uL   Hemoglobin 10.1 (L) 13.0 - 17.0 g/dL   HCT 30.5 (L) 39.0 - 52.0 %   MCV 95.6 78.0 - 100.0 fL   MCH 31.7 26.0 - 34.0 pg   MCHC 33.1 30.0 - 36.0 g/dL   RDW 15.7 (H) 11.5 - 15.5 %   Platelets 217 150 - 400 K/uL  Comprehensive metabolic panel     Status: Abnormal   Collection Time: 11/27/14  5:27 AM  Result Value Ref Range   Sodium 142 135 - 145 mmol/L   Potassium 3.6 3.5 - 5.1 mmol/L   Chloride 109 101 - 111 mmol/L   CO2 23 22 - 32 mmol/L   Glucose, Bld 90 65 - 99 mg/dL   BUN 6 6 - 20 mg/dL   Creatinine, Ser 0.86 0.61 - 1.24 mg/dL   Calcium 8.3 (L) 8.9 - 10.3 mg/dL   Total Protein 5.6 (L) 6.5 - 8.1 g/dL   Albumin 2.9 (L) 3.5 - 5.0 g/dL   AST 17 15 - 41 U/L   ALT 10 (L) 17 - 63 U/L   Alkaline Phosphatase 42 38 - 126 U/L   Total Bilirubin 0.8 0.3 - 1.2 mg/dL   GFR calc non Af Amer >60 >60 mL/min   GFR calc Af Amer >60 >60 mL/min    Comment: (NOTE) The eGFR has been calculated using the CKD EPI equation. This calculation has not been validated in all clinical situations. eGFR's persistently <60 mL/min signify possible Chronic Kidney Disease.    Anion gap 10 5 - 15     Lipid Panel     Component Value Date/Time   CHOL 106 09/10/2014 0406   TRIG 39 09/10/2014 0406   HDL 43 09/10/2014 0406   CHOLHDL 2.5  09/10/2014 0406   VLDL 8 09/10/2014 0406   LDLCALC 55 09/10/2014 0406     Lab Results  Component Value Date   HGBA1C 5.5 09/09/2014     Lab Results  Component Value Date   LDLCALC  55 09/10/2014   CREATININE 0.86 11/27/2014     HPI :*[name] Sr. is a 81 y.o. male with significant COPD who presented to his primary 25 office yesterday with a several week history of progressive dyspnea and weakness, and was found to have a hemoglobin around 7 (on recheck here, 7.8, as compared to 11 2 months earlier). He did not really have any significant upper tract or lower tract symptoms, no visible bleeding.   He is maintained on iron and thus has had dark stools but has not had loose frequent stools to suggest melena. He is on aspirin and Plavix and, although the hospital record indicates that he is on Protonix, the patient and his wife indicate that that medication was stopped some time ago.   He has a previous history of gastroduodenal AVMs, most recently endoscoped and cauterized approximately 3 years ago in October, 2013. I have reviewed those photographs and to me there is not compelling evidence that these were true vascular ectasia, as opposed to small erythematous spots.   The patient has received a 2 unit transfusion since admission. He is having some nonspecific chest discomfort but otherwise is not in distress.  The patient does have a history of a large proximal colonic polyp that was removed by endoscopic mucosal resection at [facility] approximately 4 years ago. A follow-up examination, performed in November 2012 at [facility], did not show any residual polyp tissue at that site, which was near the hepatic flexure. He did incidentally have a couple of small polyps at that time. He does not appear to have had subsequent colonoscopic surveillance.    HOSPITAL COURSE:   Acute blood loss anemia-EGD negative per Dr. Paulita Fujita, no further GI workup indicated in the hospital,  patient is to follow-up with Dr. Penelope Coop  Status post transfusion of 2 units of packed red blood cells, hemoglobin improved from 7.8-10.1 No episodes of bleeding Patient has been discharged home Advised to Continue PPI, okay to resume Plavix per Dr. Paulita Fujita Hold aspirin for now, resume his follow-up hemoglobin is stable Patient needs a follow-up CBC  CAD - Currently no chest pain, hold aspirin and resume Plavix as advised by gastroenterology    COPD GOLD II - Currently stable, no wheezing   Hypothyroidism - Continue Synthroid, TSH 4.89,     Discharge Exam:    Blood pressure 105/53, pulse 62, temperature 97.7 F (36.5 C), temperature source Oral, resp. rate 16, height $RemoveBe'5\' 8"'[id number]$  (1.727 m), weight 63.8 kg (140 lb 10.5 oz), SpO2 93 %.  General: No acute respiratory distress Lungs: Clear to auscultation bilaterally without wheezes or crackles Cardiovascular: Regular rate and rhythm without murmur gallop or rub normal S1 and S2 Abdomen: Nontender, nondistended, soft, bowel sounds positive, no rebound, no ascites, no appreciable mass Extremities: No significant cyanosis, clubbing, or edema bilateral lower extremities        Discharge Instructions    Diet - low sodium heart healthy    Complete by:  As directed      Increase activity slowly    Complete by:  As directed            Follow-up Information    Follow up with SHAW,KIMBERLEE, MD. Schedule an appointment as soon as possible for a visit in 3 days.   Specialty:  Family Medicine   Contact information:   [address] [place] [address] [phone]       Follow up with Cassell Clement, MD.  Schedule an appointment as soon as possible for a visit in 1 week.   Specialty:  Gastroenterology   Contact information:   [address] [place] [address] [phone]       Signed: Reyne Dumas 11/27/2014, 10:27 AM        Time spent >45 mins

## 2014-11-30 ENCOUNTER — Other Ambulatory Visit: Payer: Self-pay

## 2014-12-03 ENCOUNTER — Inpatient Hospital Stay (HOSPITAL_COMMUNITY): Payer: Medicare Other

## 2014-12-03 ENCOUNTER — Inpatient Hospital Stay (HOSPITAL_COMMUNITY)
Admission: EM | Admit: 2014-12-03 | Discharge: 2014-12-05 | DRG: 330 | Disposition: A | Discharge status: Home / Self Care | Payer: Medicare Other | Attending: Internal Medicine | Admitting: Internal Medicine

## 2014-12-03 ENCOUNTER — Encounter (HOSPITAL_COMMUNITY): Payer: Self-pay

## 2014-12-03 DIAGNOSIS — Z9842 Cataract extraction status, left eye: Secondary | ICD-10-CM | POA: Diagnosis not present

## 2014-12-03 DIAGNOSIS — Z961 Presence of intraocular lens: Secondary | ICD-10-CM | POA: Diagnosis present

## 2014-12-03 DIAGNOSIS — Z8719 Personal history of other diseases of the digestive system: Secondary | ICD-10-CM | POA: Diagnosis not present

## 2014-12-03 DIAGNOSIS — Z7982 Long term (current) use of aspirin: Secondary | ICD-10-CM

## 2014-12-03 DIAGNOSIS — D62 Acute posthemorrhagic anemia: Secondary | ICD-10-CM | POA: Diagnosis present

## 2014-12-03 DIAGNOSIS — Z7902 Long term (current) use of antithrombotics/antiplatelets: Secondary | ICD-10-CM

## 2014-12-03 DIAGNOSIS — R42 Dizziness and giddiness: Secondary | ICD-10-CM | POA: Diagnosis present

## 2014-12-03 DIAGNOSIS — Z79899 Other long term (current) drug therapy: Secondary | ICD-10-CM

## 2014-12-03 DIAGNOSIS — J449 Chronic obstructive pulmonary disease, unspecified: Secondary | ICD-10-CM | POA: Diagnosis present

## 2014-12-03 DIAGNOSIS — E785 Hyperlipidemia, unspecified: Secondary | ICD-10-CM | POA: Diagnosis present

## 2014-12-03 DIAGNOSIS — E039 Hypothyroidism, unspecified: Secondary | ICD-10-CM | POA: Diagnosis present

## 2014-12-03 DIAGNOSIS — K922 Gastrointestinal hemorrhage, unspecified: Secondary | ICD-10-CM | POA: Diagnosis present

## 2014-12-03 DIAGNOSIS — Z9841 Cataract extraction status, right eye: Secondary | ICD-10-CM

## 2014-12-03 DIAGNOSIS — Z951 Presence of aortocoronary bypass graft: Secondary | ICD-10-CM

## 2014-12-03 DIAGNOSIS — I252 Old myocardial infarction: Secondary | ICD-10-CM | POA: Diagnosis not present

## 2014-12-03 DIAGNOSIS — I2510 Atherosclerotic heart disease of native coronary artery without angina pectoris: Secondary | ICD-10-CM | POA: Diagnosis present

## 2014-12-03 DIAGNOSIS — Z885 Allergy status to narcotic agent status: Secondary | ICD-10-CM | POA: Diagnosis not present

## 2014-12-03 DIAGNOSIS — Z888 Allergy status to other drugs, medicaments and biological substances status: Secondary | ICD-10-CM

## 2014-12-03 DIAGNOSIS — Z87891 Personal history of nicotine dependence: Secondary | ICD-10-CM | POA: Diagnosis not present

## 2014-12-03 DIAGNOSIS — K921 Melena: Secondary | ICD-10-CM | POA: Diagnosis present

## 2014-12-03 DIAGNOSIS — Z886 Allergy status to analgesic agent status: Secondary | ICD-10-CM | POA: Diagnosis not present

## 2014-12-03 DIAGNOSIS — Z9861 Coronary angioplasty status: Secondary | ICD-10-CM

## 2014-12-03 DIAGNOSIS — Z955 Presence of coronary angioplasty implant and graft: Secondary | ICD-10-CM

## 2014-12-03 DIAGNOSIS — K633 Ulcer of intestine: Secondary | ICD-10-CM | POA: Diagnosis present

## 2014-12-03 LAB — HEMOGLOBIN AND HEMATOCRIT, BLOOD
HCT: 29.6 % — ABNORMAL LOW (ref 39.0–52.0)
Hemoglobin: 9.7 g/dL — ABNORMAL LOW (ref 13.0–17.0)

## 2014-12-03 LAB — PREPARE RBC (CROSSMATCH)

## 2014-12-03 LAB — CBC WITH DIFFERENTIAL/PLATELET
BASOS ABS: 0 10*3/uL (ref 0.0–0.1)
Basophils Relative: 1 % (ref 0–1)
Eosinophils Absolute: 0.2 10*3/uL (ref 0.0–0.7)
Eosinophils Relative: 4 % (ref 0–5)
HEMATOCRIT: 27.9 % — AB (ref 39.0–52.0)
HEMOGLOBIN: 9.1 g/dL — AB (ref 13.0–17.0)
Lymphocytes Relative: 19 % (ref 12–46)
Lymphs Abs: 1 10*3/uL (ref 0.7–4.0)
MCH: 31.4 pg (ref 26.0–34.0)
MCHC: 32.6 g/dL (ref 30.0–36.0)
MCV: 96.2 fL (ref 78.0–100.0)
Monocytes Absolute: 0.7 10*3/uL (ref 0.1–1.0)
Monocytes Relative: 13 % — ABNORMAL HIGH (ref 3–12)
NEUTROS PCT: 63 % (ref 43–77)
Neutro Abs: 3.3 10*3/uL (ref 1.7–7.7)
Platelets: 268 10*3/uL (ref 150–400)
RBC: 2.9 MIL/uL — AB (ref 4.22–5.81)
RDW: 14.3 % (ref 11.5–15.5)
WBC: 5.2 10*3/uL (ref 4.0–10.5)

## 2014-12-03 LAB — COMPREHENSIVE METABOLIC PANEL
ALBUMIN: 3.2 g/dL — AB (ref 3.5–5.0)
ALT: 15 U/L — ABNORMAL LOW (ref 17–63)
AST: 42 U/L — ABNORMAL HIGH (ref 15–41)
Alkaline Phosphatase: 38 U/L (ref 38–126)
Anion gap: 6 (ref 5–15)
BUN: 15 mg/dL (ref 6–20)
CHLORIDE: 109 mmol/L (ref 101–111)
CO2: 25 mmol/L (ref 22–32)
Calcium: 8.7 mg/dL — ABNORMAL LOW (ref 8.9–10.3)
Creatinine, Ser: 0.87 mg/dL (ref 0.61–1.24)
GFR calc non Af Amer: >60 mL/min (ref >60)
Glucose, Bld: 94 mg/dL (ref 65–99)
Potassium: 4.6 mmol/L (ref 3.5–5.1)
Sodium: 140 mmol/L (ref 135–145)
Total Bilirubin: 1 mg/dL (ref 0.3–1.2)
Total Protein: 5.9 g/dL — ABNORMAL LOW (ref 6.5–8.1)

## 2014-12-03 LAB — RETICULOCYTES
RBC.: 3.17 MIL/uL — ABNORMAL LOW (ref 4.22–5.81)
RETIC COUNT ABSOLUTE: 53.9 10*3/uL (ref 19.0–186.0)
Retic Ct Pct: 1.7 % (ref 0.4–3.1)

## 2014-12-03 LAB — APTT: APTT: 32 s (ref 24–37)

## 2014-12-03 LAB — PROTIME-INR
INR: 1.13 (ref 0.00–1.49)
PROTHROMBIN TIME: 14.7 s (ref 11.6–15.2)

## 2014-12-03 LAB — IRON AND TIBC
IRON: 164 ug/dL (ref 45–182)
Saturation Ratios: 54 % — ABNORMAL HIGH (ref 17.9–39.5)
TIBC: 304 ug/dL (ref 250–450)
UIBC: 140 ug/dL

## 2014-12-03 LAB — VITAMIN B12: Vitamin B-12: 218 pg/mL (ref 180–914)

## 2014-12-03 LAB — FERRITIN: Ferritin: 19 ng/mL — ABNORMAL LOW (ref 24–336)

## 2014-12-03 LAB — MRSA PCR SCREENING: MRSA by PCR: NEGATIVE

## 2014-12-03 LAB — FOLATE: FOLATE: 27 ng/mL (ref >5.9)

## 2014-12-03 MED ORDER — PANTOPRAZOLE SODIUM 40 MG IV SOLR
40.0000 mg | Freq: Two times a day (BID) | INTRAVENOUS | Status: DC
  Administered 2014-12-03 – 2014-12-05 (×5): 40 mg via INTRAVENOUS
  Filled 2014-12-03 (×6): qty 40

## 2014-12-03 MED ORDER — ROSUVASTATIN CALCIUM 10 MG PO TABS
10.0000 mg | ORAL_TABLET | Freq: Every day | ORAL | Status: DC
  Administered 2014-12-03 – 2014-12-04 (×2): 10 mg via ORAL
  Filled 2014-12-03 (×3): qty 1

## 2014-12-03 MED ORDER — TECHNETIUM TC 99M-LABELED RED BLOOD CELLS IV KIT
25.0000 | PACK | Freq: Once | INTRAVENOUS | Status: AC | PRN
  Administered 2014-12-03: 25 via INTRAVENOUS

## 2014-12-03 MED ORDER — SODIUM CHLORIDE 0.9 % IV SOLN
1000.0000 mL | INTRAVENOUS | Status: DC
  Administered 2014-12-03: 1000 mL via INTRAVENOUS

## 2014-12-03 MED ORDER — NITROGLYCERIN 0.4 MG SL SUBL: 0.4000 mg | SUBLINGUAL_TABLET | SUBLINGUAL | Status: DC | PRN

## 2014-12-03 MED ORDER — SODIUM CHLORIDE 0.9 % IV SOLN
1000.0000 mL | INTRAVENOUS | Status: DC
  Administered 2014-12-03 (×2): 1000 mL via INTRAVENOUS

## 2014-12-03 MED ORDER — ONDANSETRON HCL 4 MG PO TABS: 4.0000 mg | ORAL_TABLET | Freq: Four times a day (QID) | ORAL | Status: DC | PRN

## 2014-12-03 MED ORDER — PEG 3350-KCL-NA BICARB-NACL 420 G PO SOLR
4000.0000 mL | Freq: Once | ORAL | Status: AC
  Administered 2014-12-04: 4000 mL via ORAL
  Filled 2014-12-03: qty 4000

## 2014-12-03 MED ORDER — METOCLOPRAMIDE HCL 5 MG/ML IJ SOLN
5.0000 mg | Freq: Once | INTRAMUSCULAR | Status: AC
Start: 2014-12-04 — End: 2014-12-04
  Administered 2014-12-04: 5 mg via INTRAVENOUS
  Filled 2014-12-03: qty 1

## 2014-12-03 MED ORDER — SODIUM CHLORIDE 0.9 % IJ SOLN
3.0000 mL | Freq: Two times a day (BID) | INTRAMUSCULAR | Status: DC
  Administered 2014-12-03 – 2014-12-04 (×3): 3 mL via INTRAVENOUS

## 2014-12-03 MED ORDER — SODIUM CHLORIDE 0.9 % IV SOLN
Freq: Once | INTRAVENOUS | Status: AC
  Administered 2014-12-03: 14:00:00 via INTRAVENOUS

## 2014-12-03 MED ORDER — HYDROCODONE-ACETAMINOPHEN 5-325 MG PO TABS: 1.0000 | ORAL_TABLET | ORAL | Status: DC | PRN

## 2014-12-03 MED ORDER — ADULT MULTIVITAMIN W/MINERALS CH
1.0000 | ORAL_TABLET | Freq: Every day | ORAL | Status: DC
  Administered 2014-12-04 – 2014-12-05 (×2): 1 via ORAL
  Filled 2014-12-03 (×2): qty 1

## 2014-12-03 MED ORDER — GUAIFENESIN-DM 100-10 MG/5ML PO SYRP
5.0000 mL | ORAL_SOLUTION | ORAL | Status: DC | PRN
  Filled 2014-12-03: qty 5

## 2014-12-03 MED ORDER — ONDANSETRON HCL 4 MG/2ML IJ SOLN: 4.0000 mg | Freq: Four times a day (QID) | INTRAMUSCULAR | Status: DC | PRN

## 2014-12-03 MED ORDER — LEVOTHYROXINE SODIUM 100 MCG PO TABS
100.0000 ug | ORAL_TABLET | Freq: Every day | ORAL | Status: DC
  Administered 2014-12-04 – 2014-12-05 (×2): 100 ug via ORAL
  Filled 2014-12-03 (×3): qty 1

## 2014-12-03 MED ORDER — HYDRALAZINE HCL 20 MG/ML IJ SOLN: 10.0000 mg | Freq: Four times a day (QID) | INTRAMUSCULAR | Status: DC | PRN

## 2014-12-03 NOTE — ED Notes (Signed)
Blood transfusion stopped by procedural nurse.

## 2014-12-03 NOTE — Consult Note (Signed)
Referring Provider: Dr. Lala Lund ([facility]) Primary Care Physician:  Mayra Neer, MD Primary Gastroenterologist:  Dr. Penelope Coop  Reason for Consultation:  Hematochezia  HPI: Timothy Millin Sr. is a 81 y.o. male seen by [place] consultatively a week ago when he presented with anemia (hemoglobin 7.8) without overt GI bleeding. He is on aspirin and Plavix, but endoscopy at the time of his last hospitalization, a week ago, showed only a small ulceration on the rim of his hiatal hernia, which was nonbleeding and was not felt to account for his GI blood loss.  With that background, the patient had 2 episodes of large volume red blood hematochezia at home this morning, with 2 additional episodes after coming to the hospital (albeit smaller in volume). No syncope. No frank hemodynamic instability. No dyspeptic symptoms, such as nausea, epigastric pain, or reflux.  The patient's blood count on admission, 9.1, was 1 g less than it was a week earlier at time of discharge. On repeat, roughly 10 hours after admission, the hemoglobin had actually risen to 9.7.  A bleeding scan obtained this evening showed what appeared to be a small amount of active bleeding in the region of the terminal ileum or proximal colon.  The patient's past history includes the removal of a several centimeter polyp from the region of the hepatic flexure approximately 4 years ago, performed at [facility]. His most recent colonoscopic exam was several months thereafter, in 2012, also performed at [facility], which showed no residual polyp tissue at the polypectomy site. I have reviewed 3 colonoscopy reports on this patient (1 at our office, 2 at [facility]) and none made any mention of diverticular disease or vascular ectasia being present.     Past Medical History  Diagnosis Date  . High cholesterol   . Anginal pain   . Myocardial infarction 1987  . Exertional dyspnea 01/04/2012  . History of blood transfusion 1987;  11/25/2014    "emergent CABG; anemia"  . Numbness and tingling in hands 01/04/2012    "right one goes to sleep on steering wheel when I'm driving; been that way long time; @ least 1 yr"  . Thyroid disease   . Coronary artery disease   . COPD (chronic obstructive pulmonary disease)   . Hyperlipidemia   . Complication of anesthesia 1987    "didn't wake up for 5 days"  . Hypothyroidism   . Anemia   . GI bleed due to NSAIDs 02/2012    Past Surgical History  Procedure Laterality Date  . Coronary angioplasty with stent placement  ? 1990's    "3"  . Coronary angioplasty with stent placement  01/04/2012    "2; total of 5"  . Wrist fracture surgery Left ~ 2009  . Wrist hardware removal Left ~ 2010    "took steel bar out"  . Colonoscopy w/ polypectomy  2011    "had to take it out in 3 pieces; all benign"  . Cataract extraction w/ intraocular lens  implant, bilateral Bilateral 2012  . Esophagogastroduodenoscopy  02/07/2012    Procedure: ESOPHAGOGASTRODUODENOSCOPY (EGD);  Surgeon: Wonda Horner, MD;  Location: [facility] ENDOSCOPY;  Service: Endoscopy;  Laterality: N/A;  . Percutaneous coronary stent intervention (pci-s) N/A 01/04/2012    Procedure: PERCUTANEOUS CORONARY STENT INTERVENTION (PCI-S);  Surgeon: Sinclair Grooms, MD;  Location: [facility] CATH LAB;  Service: Cardiovascular;  Laterality: N/A;  . Cardiac catheterization    . Cardiac catheterization N/A 09/10/2014    Procedure: Left Heart Cath  and Cors/Grafts Angiography;  Surgeon: Peter M Martinique, MD;  Location: [facility] INVASIVE CV LAB CUPID;  Service: Cardiovascular;  Laterality: N/A;  . Coronary artery bypass graft  1987    CABG X1  . Esophagogastroduodenoscopy (egd) with propofol Left 11/26/2014    Procedure: ESOPHAGOGASTRODUODENOSCOPY (EGD) WITH PROPOFOL;  Surgeon: Arta Silence, MD;  Location: [facility] ENDOSCOPY;  Service: Endoscopy;  Laterality: Left;    Prior to Admission medications   Medication Sig Start Date End Date Taking? Authorizing Provider   acetaminophen (TYLENOL) 325 MG tablet Take 2 tablets (650 mg total) by mouth every 4 (four) hours as needed for headache or mild pain. 09/10/14  Yes Erlene Quan, PA-C  aspirin 81 MG EC tablet Take 1 tablet (81 mg total) by mouth daily. 12/07/14  Yes Reyne Dumas, MD  clopidogrel (PLAVIX) 75 MG tablet Take 75 mg by mouth daily.   Yes Historical Provider, MD  ferrous sulfate 325 (65 FE) MG tablet Take 650 mg by mouth daily with breakfast.   Yes Historical Provider, MD  levothyroxine (SYNTHROID, LEVOTHROID) 100 MCG tablet Take 100 mcg by mouth daily before breakfast.   Yes Historical Provider, MD  Multiple Vitamin (MULTIVITAMIN WITH MINERALS) TABS Take 1 tablet by mouth daily.   Yes Historical Provider, MD  nitroGLYCERIN (NITROSTAT) 0.4 MG SL tablet Place 1 tablet (0.4 mg total) under the tongue every 5 (five) minutes as needed. For chest pain 11/16/14  Yes Jerline Pain, MD  pantoprazole (PROTONIX) 40 MG tablet Take 40 mg by mouth daily.   Yes Historical Provider, MD  rosuvastatin (CRESTOR) 10 MG tablet Take 10 mg by mouth daily.   Yes Historical Provider, MD    Current Facility-Administered Medications  Medication Dose Route Frequency Provider Last Rate Last Dose  . 0.9 %  sodium chloride infusion  1,000 mL Intravenous Continuous Thurnell Lose, MD 50 mL/hr at 12/03/14 2231 1,000 mL at 12/03/14 2231  . guaiFENesin-dextromethorphan (ROBITUSSIN DM) 100-10 MG/5ML syrup 5 mL  5 mL Oral Q4H PRN Thurnell Lose, MD      . hydrALAZINE (APRESOLINE) injection 10 mg  10 mg Intravenous Q6H PRN Thurnell Lose, MD      . HYDROcodone-acetaminophen (NORCO/VICODIN) 5-325 MG per tablet 1-2 tablet  1-2 tablet Oral Q4H PRN Thurnell Lose, MD      . Derrill Memo ON 12/04/2014] levothyroxine (SYNTHROID, LEVOTHROID) tablet 100 mcg  100 mcg Oral QAC breakfast Thurnell Lose, MD      . Derrill Memo ON 12/04/2014] multivitamin with minerals tablet 1 tablet  1 tablet Oral Daily Thurnell Lose, MD      . nitroGLYCERIN  (NITROSTAT) SL tablet 0.4 mg  0.4 mg Sublingual Q5 min PRN Thurnell Lose, MD      . ondansetron (ZOFRAN) tablet 4 mg  4 mg Oral Q6H PRN Thurnell Lose, MD       Or  . ondansetron (ZOFRAN) injection 4 mg  4 mg Intravenous Q6H PRN Thurnell Lose, MD      . pantoprazole (PROTONIX) injection 40 mg  40 mg Intravenous Q12H Charlesetta Shanks, MD   40 mg at 12/03/14 2235  . rosuvastatin (CRESTOR) tablet 10 mg  10 mg Oral q1800 Thurnell Lose, MD   10 mg at 12/03/14 2234  . sodium chloride 0.9 % injection 3 mL  3 mL Intravenous Q12H Thurnell Lose, MD   3 mL at 12/03/14 2235    Allergies as of 12/03/2014 - Review Complete 12/03/2014  Allergen Reaction  Noted  . Ibuprofen Other (See Comments) 09/09/2014  . Morphine and related Nausea And Vomiting 09/09/2014  . Atorvastatin  11/25/2014    Family History  Problem Relation Age of Onset  . Emphysema Father     ? if he smoked or not     History   Social History  . Marital Status: Married    Spouse Name: N/A  . Number of Children: N/A  . Years of Education: N/A   Occupational History  . Not on file.   Social History Main Topics  . Smoking status: Former Smoker -- 1.00 packs/day for 40 years    Types: Cigarettes    Quit date: 05/08/1988  . Smokeless tobacco: Never Used  . Alcohol Use: No     Comment: 11/25/2014 "hadn't drank any alcohol in the 2000's"  . Drug Use: No  . Sexual Activity: Yes   Other Topics Concern  . Not on file   Social History Narrative   ** Merged History Encounter **        Review of Systems: Negative for chest pain, shortness of breath, joint pains, dizziness, headache, abdominal pain, dyspepsia, urinary difficulties  Physical Exam: Vital signs in last 24 hours: Temp:  [97.6 F (36.4 C)-98.2 F (36.8 C)] 97.9 F (36.6 C) (07/28 2000) Pulse Rate:  [49-76] 56 (07/28 1646) Resp:  [13-24] 18 (07/28 1646) BP: (103-153)/(39-66) 134/55 mmHg (07/28 1646) SpO2:  [92 %-98 %] 93 % (07/28 1646) Weight:   [61.281 kg (135 lb 1.6 oz)] 61.281 kg (135 lb 1.6 oz) (07/28 1143)   General:   Alert,  Well-developed, well-nourished, pleasant and cooperative in NAD, normal vital signs. Head:  Normocephalic and atraumatic. Eyes:  Sclera clear, no icterus.   Conjunctiva pink. Mouth:   No ulcerations or lesions.  Oropharynx pink & moist. Neck:   No masses or thyromegaly. Lungs:  Clear throughout to auscultation, but diminished breath sounds consistent with COPD.   No wheezes, crackles, or rhonchi. No evident respiratory distress. Heart:   Regular rate and rhythm; no murmurs, clicks, rubs,  or gallops. Abdomen:  Soft, nontender, nontympanitic, and nondistended. No masses, hepatosplenomegaly or ventral hernias noted. Normal bowel sounds, without bruits, guarding, or rebound.   Msk:   Symmetrical without gross deformities. Pulses:  Radial pulse is very full. Extremities:   Without clubbing, cyanosis, or edema. Neurologic:  Alert and coherent;  grossly normal neurologically. Skin:  Intact without significant lesions or rashes. The skin is very warm, and well perfused. Cervical Nodes:  No significant cervical adenopathy. Psych:   Alert and cooperative. Normal mood and affect.  Intake/Output from previous day:   Intake/Output this shift:    Lab Results:  Recent Labs  12/03/14 1215 12/03/14 2148  WBC 5.2  --   HGB 9.1* 9.7*  HCT 27.9* 29.6*  PLT 268  --    BMET  Recent Labs  12/03/14 1215  NA 140  K 4.6  CL 109  CO2 25  GLUCOSE 94  BUN 15  CREATININE 0.87  CALCIUM 8.7*   LFT  Recent Labs  12/03/14 1215  PROT 5.9*  ALBUMIN 3.2*  AST 42*  ALT 15*  ALKPHOS 38  BILITOT 1.0   PT/INR  Recent Labs  12/03/14 1215  LABPROT 14.7  INR 1.13    Studies/Results: Nm Gi Blood Loss  12/03/2014   CLINICAL DATA:  Large amount of blood in the stools today.  EXAM: NUCLEAR MEDICINE GASTROINTESTINAL BLEEDING SCAN  TECHNIQUE: Sequential abdominal images were obtained  following intravenous  administration of Tc-34mlabeled red blood cells.  RADIOPHARMACEUTICALS:  25 mCi Tc-917mn-vitro labeled red cells.  COMPARISON:  Abdomen and pelvis CT dated 07/25/2006.  FINDINGS: There is a small amount of progressive tracer accumulation in the right lower quadrant of the abdomen, extending superiorly from that location. No other abnormalities are seen.  IMPRESSION: Active gastrointestinal bleeding in the terminal ileum or right colon.  These results will be called to the ordering clinician or representative by the Radiologist Assistant, and communication documented in the PACS or zVision Dashboard.   Electronically Signed   By: StClaudie Revering.D.   On: 12/03/2014 18:51    Impression: Acute GI bleeding, almost certainly of lower tract origin, superimposed on a recent presentation of subclinical blood loss leading to anemia. It appears the bleeding is coming from the proximal colon or distal ileum, which would raise the question of aspirin induced ulcerations (which preferentially occur in those areas), diverticular bleeding (although previous colonoscopies did not note diverticulosis), or vascular ectasia.  Based on the patient's stable hemoglobin and vital signs since admission, and his overall appearance, it does not appear that he is having a brisk bleed at the present time, even though his bleeding scan was (weakly?) positive.  Plan: 1. Begin prep for colonoscopy, hopefully it can be accomplished tomorrow afternoon. This patient with COPD would be a good candidate for propofol sedation and monitored anesthesia care.  2. If the patient develops acute, destabilizing hemorrhage, I think he would be better served I interventional radiology. However, at the present time, I do not feel he is leading briskly enough for angiography to necessarily be positive.   LOS: 0 days   [place]  12/03/2014, 11:17 PM   Pager [phone] no answer or after 5 PM call [phone]

## 2014-12-03 NOTE — ED Notes (Signed)
Attempted report 

## 2014-12-03 NOTE — ED Notes (Signed)
Kendrick Fries (sister) [phone]

## 2014-12-03 NOTE — H&P (Signed)
Patient Demographics:    [name], is a 81 y.o. male  MRN: [id number]   DOB - Oct 25, 1933  Admit Date - 12/03/2014  Outpatient Primary MD for the patient is Mayra Neer, MD   With History of -  Past Medical History  Diagnosis Date  . High cholesterol   . Anginal pain   . Myocardial infarction 1987  . Exertional dyspnea 01/04/2012  . History of blood transfusion 1987; 11/25/2014    "emergent CABG; anemia"  . Numbness and tingling in hands 01/04/2012    "right one goes to sleep on steering wheel when I'm driving; been that way long time; @ least 1 yr"  . Thyroid disease   . Coronary artery disease   . COPD (chronic obstructive pulmonary disease)   . Hyperlipidemia   . Complication of anesthesia 1987    "didn't wake up for 5 days"  . Hypothyroidism   . Anemia   . GI bleed due to NSAIDs 02/2012      Past Surgical History  Procedure Laterality Date  . Coronary angioplasty with stent placement  ? 1990's    "3"  . Coronary angioplasty with stent placement  01/04/2012    "2; total of 5"  . Wrist fracture surgery Left ~ 2009  . Wrist hardware removal Left ~ 2010    "took steel bar out"  . Colonoscopy w/ polypectomy  2011    "had to take it out in 3 pieces; all benign"  . Cataract extraction w/ intraocular lens  implant, bilateral Bilateral 2012  . Esophagogastroduodenoscopy  02/07/2012    Procedure: ESOPHAGOGASTRODUODENOSCOPY (EGD);  Surgeon: Wonda Horner, MD;  Location: [facility] ENDOSCOPY;  Service: Endoscopy;  Laterality: N/A;  . Percutaneous coronary stent intervention (pci-s) N/A 01/04/2012    Procedure: PERCUTANEOUS CORONARY STENT INTERVENTION (PCI-S);  Surgeon: Sinclair Grooms, MD;  Location: [facility] CATH LAB;  Service: Cardiovascular;  Laterality: N/A;  . Cardiac catheterization    . Cardiac  catheterization N/A 09/10/2014    Procedure: Left Heart Cath and Cors/Grafts Angiography;  Surgeon: Peter M Martinique, MD;  Location: [facility] INVASIVE CV LAB CUPID;  Service: Cardiovascular;  Laterality: N/A;  . Coronary artery bypass graft  1987    CABG X1  . Esophagogastroduodenoscopy (egd) with propofol Left 11/26/2014    Procedure: ESOPHAGOGASTRODUODENOSCOPY (EGD) WITH PROPOFOL;  Surgeon: Arta Silence, MD;  Location: [facility] ENDOSCOPY;  Service: Endoscopy;  Laterality: Left;    in for   Chief Complaint  Patient presents with  . Rectal Bleeding      HPI:    [name]  is a 81 y.o. male,  With H/O GI Bleed, CAD - Stent in 2013, COPD, Hypothyroidism, Dyslipidemia, who was recently admitted to GI bleed with a negative EGD 1 week ago, on ASA-Plavix for CAD, comes in 1 day H/O bright red blood x 3  With some RLQ cramping , no preceding diarrhea no fevers.  Came  to ER where H&H had a slight drop, he was feeling weak, BP and BUN stable, I was called to admit for a possible LGI bleed.    Review of systems:    In addition to the HPI above,   No Fever-chills, No Headache, No changes with Vision or hearing, No problems swallowing food or Liquids, No Chest pain, Cough or Shortness of Breath, No Abdominal pain, No Nausea or Vommitting, Bowel movements are regular, No Blood in Urine, No dysuria, No new skin rashes or bruises, No new joints pains-aches,  No new weakness, tingling, numbness in any extremity, No recent weight gain or loss, No polyuria, polydypsia or polyphagia, No significant Mental Stressors.  A full 10 point Review of Systems was done, except as stated above, all other Review of Systems were negative.    Social History:     History  Substance Use Topics  . Smoking status: Former Smoker -- 1.00 packs/day for 40 years    Types: Cigarettes    Quit date: 05/08/1988  . Smokeless tobacco: Never Used  . Alcohol Use: No     Comment: 11/25/2014 "hadn't drank any alcohol in the  2000's"    Lives - at home with wife     Family History :     Family History  Problem Relation Age of Onset  . Emphysema Father     ? if he smoked or not       Home Medications:   Prior to Admission medications   Medication Sig Start Date End Date Taking? Authorizing Provider  acetaminophen (TYLENOL) 325 MG tablet Take 2 tablets (650 mg total) by mouth every 4 (four) hours as needed for headache or mild pain. 09/10/14  Yes Erlene Quan, PA-C  aspirin 81 MG EC tablet Take 1 tablet (81 mg total) by mouth daily. 12/07/14  Yes Reyne Dumas, MD  clopidogrel (PLAVIX) 75 MG tablet Take 75 mg by mouth daily.   Yes Historical Provider, MD  ferrous sulfate 325 (65 FE) MG tablet Take 650 mg by mouth daily with breakfast.   Yes Historical Provider, MD  levothyroxine (SYNTHROID, LEVOTHROID) 100 MCG tablet Take 100 mcg by mouth daily before breakfast.   Yes Historical Provider, MD  Multiple Vitamin (MULTIVITAMIN WITH MINERALS) TABS Take 1 tablet by mouth daily.   Yes Historical Provider, MD  nitroGLYCERIN (NITROSTAT) 0.4 MG SL tablet Place 1 tablet (0.4 mg total) under the tongue every 5 (five) minutes as needed. For chest pain 11/16/14  Yes Jerline Pain, MD  pantoprazole (PROTONIX) 40 MG tablet Take 40 mg by mouth daily.   Yes Historical Provider, MD  rosuvastatin (CRESTOR) 10 MG tablet Take 10 mg by mouth daily.   Yes Historical Provider, MD     Allergies:     Allergies  Allergen Reactions  . Ibuprofen Other (See Comments)    Internal bleeding  . Morphine And Related Nausea And Vomiting  . Atorvastatin     Muscle pain     Physical Exam:   Vitals  Blood pressure 135/57, pulse 50, temperature 97.6 F (36.4 C), temperature source Oral, resp. rate 16, height '5\' 7"'$  (1.702 m), weight 61.281 kg (135 lb 1.6 oz), SpO2 97 %.   1. General middle aged white male lying in bed in NAD,     2. Normal affect and insight, Not Suicidal or Homicidal, Awake Alert, Oriented X 3.  3. No F.N deficits,  ALL C.Nerves Intact, Strength 5/5 all 4 extremities, Sensation intact all 4  extremities, Plantars down going.  4. Ears and Eyes appear Normal, Conjunctivae clear, PERRLA. Moist Oral Mucosa.  5. Supple Neck, No JVD, No cervical lymphadenopathy appriciated, No Carotid Bruits.  6. Symmetrical Chest wall movement, Good air movement bilaterally, CTAB.  7. RRR, No Gallops, Rubs or Murmurs, No Parasternal Heave.  8. Positive Bowel Sounds, Abdomen Soft, No tenderness, No organomegaly appriciated,No rebound -guarding or rigidity.  9.  No Cyanosis, Normal Skin Turgor, No Skin Rash or Bruise.  10. Good muscle tone,  joints appear normal , no effusions, Normal ROM.  11. No Palpable Lymph Nodes in Neck or Axillae     Data Review:    CBC  Recent Labs Lab 11/27/14 0527 12/03/14 1215  WBC 8.2 5.2  HGB 10.1* 9.1*  HCT 30.5* 27.9*  PLT 217 268  MCV 95.6 96.2  MCH 31.7 31.4  MCHC 33.1 32.6  RDW 15.7* 14.3  LYMPHSABS  --  1.0  MONOABS  --  0.7  EOSABS  --  0.2  BASOSABS  --  0.0   ------------------------------------------------------------------------------------------------------------------  Chemistries   Recent Labs Lab 11/27/14 0527 12/03/14 1215  NA 142 140  K 3.6 4.6  CL 109 109  CO2 23 25  GLUCOSE 90 94  BUN 6 15  CREATININE 0.86 0.87  CALCIUM 8.3* 8.7*  AST 17 42*  ALT 10* 15*  ALKPHOS 42 38  BILITOT 0.8 1.0   ------------------------------------------------------------------------------------------------------------------ estimated creatinine clearance is 59.7 mL/min (by C-G formula based on Cr of 0.87). ------------------------------------------------------------------------------------------------------------------ No results for input(s): TSH, T4TOTAL, T3FREE, THYROIDAB in the last 72 hours.  Invalid input(s): FREET3   Coagulation profile  Recent Labs Lab 12/03/14 1215  INR 1.13    ------------------------------------------------------------------------------------------------------------------- No results for input(s): DDIMER in the last 72 hours. -------------------------------------------------------------------------------------------------------------------  Cardiac Enzymes No results for input(s): CKMB, TROPONINI, MYOGLOBIN in the last 168 hours.  Invalid input(s): CK ------------------------------------------------------------------------------------------------------------------ Invalid input(s): POCBNP   ---------------------------------------------------------------------------------------------------------------  Urinalysis No results found for: COLORURINE, APPEARANCEUR, LABSPEC, [place], GLUCOSEU, HGBUR, BILIRUBINUR, KETONESUR, PROTEINUR, UROBILINOGEN, NITRITE, LEUKOCYTESUR  ----------------------------------------------------------------------------------------------------------------   Imaging Results:    No results found.  My personal review of EKG: Rhythm NSR, Rate  63/min,  no Acute ST changes   Assessment & Plan:    1. LGI Bleed - NML BUN, RLQ cramping, H&H mild drop, likely diverticular, getting 1 unit PRBC in the ER as was symptomatic with 3 large Bloody BMs, IV PPI BID, monitor H&H, Tagged RBC Scan, NPO, H&H q 4, [facility] Dr Oletta Lamas called.   2. LGI Blood loss related Anemia - as in #1, check Iron panel.   3.CAD - stent in 2013, EKG stable, Hold ASA-Plavix, statin to continue.   4.Hypothyroidism - on synthroid.   5.COPD - at baseline, supportive Rx.     DVT Prophylaxis  SCDs    AM Labs Ordered, also please review Full Orders  Family Communication: Admission, patients condition and plan of care including tests being ordered have been discussed with the patient and wife who indicate understanding and agree with the plan and Code Status.  Code Status Full  Likely DC to  Home  Condition GUARDED     Time spent in  minutes : 35    Hemi Chacko K M.D on 12/03/2014 at 2:58 PM  Between 7am to 7pm - Pager - [phone]  After 7pm go to [email or web address] - password [facility]  [facility] - Office  [phone]

## 2014-12-03 NOTE — ED Notes (Signed)
Pt presents with 1 week h/o black stools but attributed that to iron.  Pt reports noting red blood in stool today.  Pt reports L sided abdominal pain that began this morning as well.  Pt denies any rectal pain, is bleeding only with bowel movements; reports dizziness.

## 2014-12-03 NOTE — ED Provider Notes (Signed)
CSN: [id number]     Arrival date & time 12/03/14  1136 History   First MD Initiated Contact with Patient 12/03/14 1148     Chief Complaint  Patient presents with  . Rectal Bleeding     (Consider location/radiation/quality/duration/timing/severity/associated sxs/prior Treatment) HPI Patient reports he had 2 episodes of rectal bleeding this morning. He went to the toilet have a stool and he reports he passed a large amount of blood. He thought it might not recur so he did not determine to come to the hospital at that time. He reports however he then had a second episode again passing a large amount of red blood. He reports he's been feeling lightheaded and dizzy. He reports with standing up he feels like he might pass out. He reports he had lost blood last week and had to come in the hospital and get a transfusion. He states that he was feeling much improved after blood transfusion last week. He reports he had an upper endoscopy done at that time but not lower. Past Medical History  Diagnosis Date  . High cholesterol   . Anginal pain   . Myocardial infarction 1987  . Exertional dyspnea 01/04/2012  . History of blood transfusion 1987; 11/25/2014    "emergent CABG; anemia"  . Numbness and tingling in hands 01/04/2012    "right one goes to sleep on steering wheel when I'm driving; been that way long time; @ least 1 yr"  . Thyroid disease   . Coronary artery disease   . COPD (chronic obstructive pulmonary disease)   . Hyperlipidemia   . Complication of anesthesia 1987    "didn't wake up for 5 days"  . Hypothyroidism   . Anemia   . GI bleed due to NSAIDs 02/2012   Past Surgical History  Procedure Laterality Date  . Coronary angioplasty with stent placement  ? 1990's    "3"  . Coronary angioplasty with stent placement  01/04/2012    "2; total of 5"  . Wrist fracture surgery Left ~ 2009  . Wrist hardware removal Left ~ 2010    "took steel bar out"  . Colonoscopy w/ polypectomy  2011   "had to take it out in 3 pieces; all benign"  . Cataract extraction w/ intraocular lens  implant, bilateral Bilateral 2012  . Esophagogastroduodenoscopy  02/07/2012    Procedure: ESOPHAGOGASTRODUODENOSCOPY (EGD);  Surgeon: Wonda Horner, MD;  Location[facility] ENDOSCOPY;  Service: Endoscopy;  Laterality: N/A;  . Percutaneous coronary stent intervention (pci-s) N/A 01/04/2012    Procedure: PERCUTANEOUS CORONARY STENT INTERVENTION (PCI-S);  Surgeon: Sinclair Grooms, MD;  Location: [facility] CATH LAB;  Service: Cardiovascular;  Laterality: N/A;  . Cardiac catheterization    . Cardiac catheterization N/A 09/10/2014    Procedure: Left Heart Cath and Cors/Grafts Angiography;  Surgeon: Peter M Martinique, MD;  Location: [facility] INVASIVE CV LAB CUPID;  Service: Cardiovascular;  Laterality: N/A;  . Coronary artery bypass graft  1987    CABG X1  . Esophagogastroduodenoscopy (egd) with propofol Left 11/26/2014    Procedure: ESOPHAGOGASTRODUODENOSCOPY (EGD) WITH PROPOFOL;  Surgeon: Arta Silence, MD;  Location: [facility] ENDOSCOPY;  Service: Endoscopy;  Laterality: Left;   Family History  Problem Relation Age of Onset  . Emphysema Father     ? if he smoked or not    History  Substance Use Topics  . Smoking status: Former Smoker -- 1.00 packs/day for 40 years    Types: Cigarettes    Quit date: 05/08/1988  .  Smokeless tobacco: Never Used  . Alcohol Use: No     Comment: 11/25/2014 "hadn't drank any alcohol in the 2000's"    Review of Systems  10 Systems reviewed and are negative for acute change except as noted in the HPI.    Allergies  Ibuprofen; Morphine and related; and Atorvastatin  Home Medications   Prior to Admission medications   Medication Sig Start Date End Date Taking? Authorizing Provider  acetaminophen (TYLENOL) 325 MG tablet Take 2 tablets (650 mg total) by mouth every 4 (four) hours as needed for headache or mild pain. 09/10/14  Yes Erlene Quan, PA-C  aspirin 81 MG EC tablet Take 1 tablet (81 mg total)  by mouth daily. 12/07/14  Yes Reyne Dumas, MD  clopidogrel (PLAVIX) 75 MG tablet Take 75 mg by mouth daily.   Yes Historical Provider, MD  ferrous sulfate 325 (65 FE) MG tablet Take 650 mg by mouth daily with breakfast.   Yes Historical Provider, MD  levothyroxine (SYNTHROID, LEVOTHROID) 100 MCG tablet Take 100 mcg by mouth daily before breakfast.   Yes Historical Provider, MD  Multiple Vitamin (MULTIVITAMIN WITH MINERALS) TABS Take 1 tablet by mouth daily.   Yes Historical Provider, MD  nitroGLYCERIN (NITROSTAT) 0.4 MG SL tablet Place 1 tablet (0.4 mg total) under the tongue every 5 (five) minutes as needed. For chest pain 11/16/14  Yes Jerline Pain, MD  pantoprazole (PROTONIX) 40 MG tablet Take 40 mg by mouth daily.   Yes Historical Provider, MD  rosuvastatin (CRESTOR) 10 MG tablet Take 10 mg by mouth daily.   Yes Historical Provider, MD   BP 119/50 mmHg  Pulse 56  Temp(Src) 98.2 F (36.8 C) (Oral)  Resp 13  Ht '5\' 7"'$  (1.702 m)  Wt 135 lb 1.6 oz (61.281 kg)  BMI 21.15 kg/m2  SpO2 92% Physical Exam  Constitutional: He is oriented to person, place, and time. He appears well-developed and well-nourished.  HENT:  Head: Normocephalic and atraumatic.  Eyes: EOM are normal. Pupils are equal, round, and reactive to light.  Neck: Neck supple.  Cardiovascular: Normal rate, regular rhythm, normal heart sounds and intact distal pulses.   Pulmonary/Chest: Effort normal and breath sounds normal.  Abdominal: Soft. Bowel sounds are normal. He exhibits no distension. There is no tenderness.  Genitourinary: Guaiac positive stool.  Digital rectal exam shows melana in the rectal vault.  Musculoskeletal: Normal range of motion. He exhibits no edema.  Neurological: He is alert and oriented to person, place, and time. He has normal strength. Coordination normal. GCS eye subscore is 4. GCS verbal subscore is 5. GCS motor subscore is 6.  Skin: Skin is warm, dry and intact.  Psychiatric: He has a normal mood  and affect.    ED Course  Procedures (including critical care time) Labs Review Labs Reviewed  COMPREHENSIVE METABOLIC PANEL - Abnormal; Notable for the following:    Calcium 8.7 (*)    Total Protein 5.9 (*)    Albumin 3.2 (*)    AST 42 (*)    ALT 15 (*)    All other components within normal limits  CBC WITH DIFFERENTIAL/PLATELET - Abnormal; Notable for the following:    RBC 2.90 (*)    Hemoglobin 9.1 (*)    HCT 27.9 (*)    Monocytes Relative 13 (*)    All other components within normal limits  PROTIME-INR  APTT  TYPE AND SCREEN  PREPARE RBC (CROSSMATCH)    Imaging Review No results found.  EKG Interpretation   Date/Time:  Thursday December 03 2014 12:31:16 EDT Ventricular Rate:  63 PR Interval:  209 QRS Duration: 86 QT Interval:  416 QTC Calculation: 426 R Axis:   18 Text Interpretation:  Sinus rhythm no change from old, no acute ischemis  or dysrythmia Confirmed by Johnney Killian, MD, Jeannie Done [phone]) on 12/03/2014  1:41:37 PM     Consult: Patient's case was reviewed with Dr. Paulita Fujita. He advises he will notify his partner of the patient's admission. He advises admit the patient to hospitalist service. MDM   Final diagnoses:  Lower GI bleed  Melanotic stools   Patient presents with lower GI bleed. He has been symptomatic with lightheadedness and dizziness. Stool is melanotic in nature. His blood pressure and heart rate have remained stable in the emergency department. His mental status is clear and his respiratory status is stable. He will be admitted for ongoing treatment of lower GI bleeding.    Charlesetta Shanks, MD 12/03/14 [phone]

## 2014-12-03 NOTE — Progress Notes (Signed)
I discussed the case with Dr. Baltazar Najjar of the [facility], who is in agreement with my recommendations in my consultation note.  I encouraged her to call me this evening if the patient shows signs of destabilizing bleeding.  Cleotis Nipper, M.D. Pager [phone] If no answer or after 5 PM call [phone]

## 2014-12-04 ENCOUNTER — Encounter (HOSPITAL_COMMUNITY)
Admission: EM | Disposition: A | Discharge status: Home / Self Care | Payer: Medicare Other | Source: Home / Self Care | Attending: Internal Medicine

## 2014-12-04 ENCOUNTER — Encounter (HOSPITAL_COMMUNITY): Payer: Self-pay

## 2014-12-04 HISTORY — PX: COLONOSCOPY: SHX5424

## 2014-12-04 LAB — CBC
HCT: 29.2 % — ABNORMAL LOW (ref 39.0–52.0)
Hemoglobin: 9.4 g/dL — ABNORMAL LOW (ref 13.0–17.0)
MCH: 30.6 pg (ref 26.0–34.0)
MCHC: 32.2 g/dL (ref 30.0–36.0)
MCV: 95.1 fL (ref 78.0–100.0)
Platelets: 222 10*3/uL (ref 150–400)
RBC: 3.07 MIL/uL — ABNORMAL LOW (ref 4.22–5.81)
RDW: 14.9 % (ref 11.5–15.5)
WBC: 6.3 10*3/uL (ref 4.0–10.5)

## 2014-12-04 LAB — BASIC METABOLIC PANEL
Anion gap: 5 (ref 5–15)
BUN: 11 mg/dL (ref 6–20)
CHLORIDE: 110 mmol/L (ref 101–111)
CO2: 23 mmol/L (ref 22–32)
CREATININE: 0.76 mg/dL (ref 0.61–1.24)
Calcium: 8.1 mg/dL — ABNORMAL LOW (ref 8.9–10.3)
GFR calc Af Amer: >60 mL/min (ref >60)
Glucose, Bld: 89 mg/dL (ref 65–99)
Potassium: 3.6 mmol/L (ref 3.5–5.1)
SODIUM: 138 mmol/L (ref 135–145)

## 2014-12-04 LAB — TYPE AND SCREEN
ABO/RH(D): A POS
Antibody Screen: NEGATIVE
Unit division: 0

## 2014-12-04 LAB — HEMOGLOBIN AND HEMATOCRIT, BLOOD
HEMATOCRIT: 32.7 % — AB (ref 39.0–52.0)
HEMATOCRIT: 30.4 % — AB (ref 39.0–52.0)
HEMATOCRIT: 32.3 % — AB (ref 39.0–52.0)
HEMOGLOBIN: 10.6 g/dL — AB (ref 13.0–17.0)
HEMOGLOBIN: 10.4 g/dL — AB (ref 13.0–17.0)
HEMOGLOBIN: 9.9 g/dL — AB (ref 13.0–17.0)

## 2014-12-04 SURGERY — COLONOSCOPY
Anesthesia: Moderate Sedation

## 2014-12-04 MED ORDER — SODIUM CHLORIDE 0.9 % IV SOLN: INTRAVENOUS | Status: DC

## 2014-12-04 MED ORDER — FENTANYL CITRATE (PF) 100 MCG/2ML IJ SOLN
INTRAMUSCULAR | Status: AC
Start: 2014-12-04 — End: 2014-12-04
  Filled 2014-12-04: qty 2

## 2014-12-04 MED ORDER — MIDAZOLAM HCL 5 MG/5ML IJ SOLN
INTRAMUSCULAR | Status: DC | PRN
  Administered 2014-12-04: 2 mg via INTRAVENOUS
  Administered 2014-12-04: 1 mg via INTRAVENOUS
  Administered 2014-12-04: 2 mg via INTRAVENOUS

## 2014-12-04 MED ORDER — FENTANYL CITRATE (PF) 100 MCG/2ML IJ SOLN
INTRAMUSCULAR | Status: DC | PRN
  Administered 2014-12-04 (×2): 25 ug via INTRAVENOUS

## 2014-12-04 MED ORDER — MIDAZOLAM HCL 5 MG/ML IJ SOLN
INTRAMUSCULAR | Status: AC
  Filled 2014-12-04: qty 2

## 2014-12-04 MED ORDER — DIPHENHYDRAMINE HCL 50 MG/ML IJ SOLN
INTRAMUSCULAR | Status: AC
  Filled 2014-12-04: qty 1

## 2014-12-04 NOTE — Progress Notes (Signed)
Utilization Review Completed.  

## 2014-12-04 NOTE — Progress Notes (Signed)
[name]] 16 (07/29 0600) BP: (102-153)/(39-66) 107/48 mmHg (07/29 0600) SpO2:  [92 %-98 %] 93 % (07/29 0600) Weight:  [61.281 kg (135 lb 1.6 oz)-63.776 kg (140 lb 9.6 oz)] 63.776 kg (140 lb 9.6 oz) (07/29 0600) Weight change:    PE: Abdomen soft nondistended  Lab Results: Results for orders placed or performed during the hospital encounter of 12/03/14 (from the past 24 hour(s))  Comprehensive metabolic panel     Status: Abnormal   Collection Time: 12/03/14 12:15 PM  Result Value Ref Range   Sodium 140 135 - 145 mmol/L   Potassium 4.6 3.5 - 5.1 mmol/L   Chloride 109 101 - 111 mmol/L   CO2 25 22 - 32 mmol/L   Glucose, Bld 94 65 - 99 mg/dL   BUN 15 6 - 20 mg/dL   Creatinine, Ser 0.87 0.61 - 1.24 mg/dL   Calcium 8.7 (L) 8.9 - 10.3 mg/dL   Total Protein 5.9 (L) 6.5 - 8.1 g/dL   Albumin 3.2 (L) 3.5 - 5.0 g/dL   AST 42 (H) 15 - 41 U/L   ALT 15 (L) 17 - 63 U/L   Alkaline Phosphatase 38 38 - 126 U/L   Total Bilirubin 1.0 0.3 - 1.2 mg/dL   GFR calc non Af Amer >60 >60 mL/min   GFR calc Af Amer >60 >60 mL/min   Anion gap 6 5 - 15  CBC WITH DIFFERENTIAL     Status: Abnormal   Collection Time: 12/03/14 12:15 PM  Result Value Ref Range   WBC 5.2 4.0 - 10.5 K/uL   RBC 2.90 (L) 4.22 - 5.81 MIL/uL   Hemoglobin 9.1 (L) 13.0 - 17.0 g/dL   HCT 27.9 (L) 39.0 - 52.0 %   MCV 96.2 78.0 - 100.0 fL   MCH 31.4 26.0 - 34.0 pg   MCHC 32.6 30.0 - 36.0 g/dL   RDW 14.3 11.5 - 15.5 %   Platelets 268 150 - 400 K/uL   Neutrophils Relative % 63 43 - 77 %   Neutro Abs 3.3 1.7 - 7.7 K/uL   Lymphocytes Relative 19 12 - 46 %   Lymphs Abs 1.0 0.7 - 4.0 K/uL   Monocytes Relative 13 (H) 3 - 12 %   Monocytes Absolute 0.7 0.1 -  1.0 K/uL   Eosinophils Relative 4 0 - 5 %   Eosinophils Absolute 0.2 0.0 - 0.7 K/uL   Basophils Relative 1 0 - 1 %   Basophils Absolute 0.0 0.0 - 0.1 K/uL  Protime-INR     Status: None   Collection Time: 12/03/14 12:15 PM  Result Value Ref Range   Prothrombin Time 14.7 11.6 - 15.2 seconds   INR 1.13 0.00 - 1.49  APTT     Status: None   Collection Time: 12/03/14 12:15 PM  Result Value Ref Range   aPTT 32 24 - 37 seconds  Type and screen     Status: None   Collection Time: 12/03/14 12:15 PM  Result Value Ref Range   ABO/RH(D) A POS    Antibody Screen NEG    Sample Expiration 12/06/2014    Unit Number [id number]    Blood Component Type RED CELLS,LR  Unit division 00    Status of Unit ISSUED,FINAL    Transfusion Status OK TO TRANSFUSE    Crossmatch Result Compatible   Prepare RBC     Status: None   Collection Time: 12/03/14 12:15 PM  Result Value Ref Range   Order Confirmation ORDER PROCESSED BY BLOOD BANK   MRSA PCR Screening     Status: None   Collection Time: 12/03/14  7:21 PM  Result Value Ref Range   MRSA by PCR NEGATIVE NEGATIVE  Hemoglobin and hematocrit, blood     Status: Abnormal   Collection Time: 12/03/14  9:48 PM  Result Value Ref Range   Hemoglobin 9.7 (L) 13.0 - 17.0 g/dL   HCT 29.6 (L) 39.0 - 52.0 %  Vitamin B12     Status: None   Collection Time: 12/03/14  9:48 PM  Result Value Ref Range   Vitamin B-12 218 180 - 914 pg/mL  Folate     Status: None   Collection Time: 12/03/14  9:48 PM  Result Value Ref Range   Folate 27.0 >5.9 ng/mL  Iron and TIBC     Status: Abnormal   Collection Time: 12/03/14  9:48 PM  Result Value Ref Range   Iron 164 45 - 182 ug/dL   TIBC 304 250 - 450 ug/dL   Saturation Ratios 54 (H) 17.9 - 39.5 %   UIBC 140 ug/dL  Ferritin     Status: Abnormal   Collection Time: 12/03/14  9:48 PM  Result Value Ref Range   Ferritin 19 (L) 24 - 336 ng/mL  Reticulocytes     Status: Abnormal   Collection Time: 12/03/14  9:48 PM  Result  Value Ref Range   Retic Ct Pct 1.7 0.4 - 3.1 %   RBC. 3.17 (L) 4.22 - 5.81 MIL/uL   Retic Count, Manual 53.9 19.0 - 186.0 K/uL  Hemoglobin and hematocrit, blood     Status: Abnormal   Collection Time: 12/04/14 12:48 AM  Result Value Ref Range   Hemoglobin 10.4 (L) 13.0 - 17.0 g/dL   HCT 32.3 (L) 39.0 - 56.4 %  Basic metabolic panel     Status: Abnormal   Collection Time: 12/04/14  5:09 AM  Result Value Ref Range   Sodium 138 135 - 145 mmol/L   Potassium 3.6 3.5 - 5.1 mmol/L   Chloride 110 101 - 111 mmol/L   CO2 23 22 - 32 mmol/L   Glucose, Bld 89 65 - 99 mg/dL   BUN 11 6 - 20 mg/dL   Creatinine, Ser 0.76 0.61 - 1.24 mg/dL   Calcium 8.1 (L) 8.9 - 10.3 mg/dL   GFR calc non Af Amer >60 >60 mL/min   GFR calc Af Amer >60 >60 mL/min   Anion gap 5 5 - 15  CBC     Status: Abnormal   Collection Time: 12/04/14  5:09 AM  Result Value Ref Range   WBC 6.3 4.0 - 10.5 K/uL   RBC 3.07 (L) 4.22 - 5.81 MIL/uL   Hemoglobin 9.4 (L) 13.0 - 17.0 g/dL   HCT 29.2 (L) 39.0 - 52.0 %   MCV 95.1 78.0 - 100.0 fL   MCH 30.6 26.0 - 34.0 pg   MCHC 32.2 30.0 - 36.0 g/dL   RDW 14.9 11.5 - 15.5 %   Platelets 222 150 - 400 K/uL    Studies/Results: Nm Gi Blood Loss  12/03/2014   CLINICAL DATA:  Large amount of blood in the stools today.  EXAM:  NUCLEAR MEDICINE GASTROINTESTINAL BLEEDING SCAN  TECHNIQUE: Sequential abdominal images were obtained following intravenous administration of Tc-51mlabeled red blood cells.  RADIOPHARMACEUTICALS:  25 mCi Tc-926mn-vitro labeled red cells.  COMPARISON:  Abdomen and pelvis CT dated 07/25/2006.  FINDINGS: There is a small amount of progressive tracer accumulation in the right lower quadrant of the abdomen, extending superiorly from that location. No other abnormalities are seen.  IMPRESSION: Active gastrointestinal bleeding in the terminal ileum or right colon.  These results will be called to the ordering clinician or representative by the Radiologist Assistant, and  communication documented in the PACS or zVision Dashboard.   Electronically Signed   By: StClaudie Revering.D.   On: 12/03/2014 18:51      Assessment: Lower GI bleed of positive pooled RBC scan  Plan: Colonoscopy this afternoon.    Jakavion Bilodeau C 12/04/2014, 8:30 AM  Pager [phone] no answer or after 5 PM call [phone]

## 2014-12-04 NOTE — Progress Notes (Addendum)
Dr. Cristina Gong called this NP earlier in shift about plan for [name]. See Dr. Osborn Coho note as this is what we discussed. At present, pt is stable with last H/H better than previous one. Continue serial H/H and CBC this am. Will undergo colonoscopy in am unless he becomes unstable tonight. Dr. Cristina Gong updated pt and his wife. Will follow. At time of this note, his BP is in the 120s and he is not tachycardic.  KJKG, NP [facility] Update-H/H stable, last one 10.4/32.3. Doing fine with prep. Still hemodynamically stable.  KJKG, NP

## 2014-12-04 NOTE — Progress Notes (Signed)
Patient Demographics:    [name], is a 82 y.o. male, DOB - 10-15-1933, AVW:[id number]  Admit date - 12/03/2014   Admitting Physician Thurnell Lose, MD  Outpatient Primary MD for the patient is Mayra Neer, MD  LOS - 1   Chief Complaint  Patient presents with  . Rectal Bleeding        Subjective:    [name] today has, No headache, No chest pain, No abdominal pain - No Nausea, No new weakness tingling or numbness, No Cough - SOB.     Assessment  & Plan :     1. LGI Bleed - with tagged RBC scan showing bleeding in the terminal ileum area, got 1 unit of packed RBC on 12/03/2014 in the ER. H&H now stable he's symptom-free. GI on board going for colonoscopy later today.   2. LGI Blood loss related Anemia - as in #1, anemia panel is stable except for low ferritin, placed on oral iron upon discharge. Outpatient follow-up with PCP.   3.CAD - stent in 2013, EKG stable, Hold ASA-Plavix, statin to continue.   4.Hypothyroidism - on synthroid.   5.COPD - at baseline, supportive Rx.    Code Status : Full  Family Communication  : Wife bedside  Disposition Plan  : Home 1-2 days  Consults  :  GI  Procedures  :   Tagged RBC scan positive for bleeding in the terminal ileal area  Colonoscopy due 12/04/2014 by Dr. Cristina Gong  DVT Prophylaxis  :   SCDs   Lab Results  Component Value Date   PLT 222 12/04/2014    Inpatient Medications  Scheduled Meds: . levothyroxine  100 mcg Oral QAC breakfast  . multivitamin with minerals  1 tablet Oral Daily  . pantoprazole (PROTONIX) IV  40 mg Intravenous Q12H  . rosuvastatin  10 mg Oral q1800  . sodium chloride  3 mL Intravenous Q12H   Continuous Infusions: . sodium chloride 1,000 mL (12/03/14 2231)   PRN  Meds:.guaiFENesin-dextromethorphan, hydrALAZINE, HYDROcodone-acetaminophen, nitroGLYCERIN, ondansetron **OR** ondansetron (ZOFRAN) IV  Antibiotics  :    Anti-infectives    None        Objective:   Filed Vitals:   12/04/14 0400 12/04/14 0500 12/04/14 0600 12/04/14 0839  BP: 111/58 102/49 107/48 121/58  Pulse: 71 63 63 65  Temp: 97.8 F (36.6 C)   97.3 F (36.3 C)  TempSrc:    Oral  Resp: '14 19 16 21  '$ Height:      Weight:   63.776 kg (140 lb 9.6 oz)   SpO2: 94% 94% 93% 95%    Wt Readings from Last 3 Encounters:  12/04/14 63.776 kg (140 lb 9.6 oz)  11/26/14 63.8 kg (140 lb 10.5 oz)  09/10/14 62.324 kg (137 lb 6.4 oz)     Intake/Output Summary (Last 24 hours) at 12/04/14 0918 Last data filed at 12/04/14 0746  Gross per 24 hour  Intake 3942.5 ml  Output    900 ml  Net 3042.5 ml     Physical Exam  Awake Alert, Oriented X 3, No new F.N deficits, Normal affect [place].AT,PERRAL Supple Neck,No JVD, No cervical lymphadenopathy appriciated.  Symmetrical Chest wall movement, Good air movement bilaterally, CTAB RRR,No Gallops,Rubs or new Murmurs, No  Parasternal Heave +ve B.Sounds, Abd Soft, No tenderness, No organomegaly appriciated, No rebound - guarding or rigidity. No Cyanosis, Clubbing or edema, No new Rash or bruise     Data Review:   Micro Results Recent Results (from the past 240 hour(s))  MRSA PCR Screening     Status: None   Collection Time: 12/03/14  7:21 PM  Result Value Ref Range Status   MRSA by PCR NEGATIVE NEGATIVE Final    Comment:        The GeneXpert MRSA Assay (FDA approved for NASAL specimens only), is one component of a comprehensive MRSA colonization surveillance program. It is not intended to diagnose MRSA infection nor to guide or monitor treatment for MRSA infections.     Radiology Reports Nm Gi Blood Loss  12/03/2014   CLINICAL DATA:  Large amount of blood in the stools today.  EXAM: NUCLEAR MEDICINE GASTROINTESTINAL BLEEDING SCAN   TECHNIQUE: Sequential abdominal images were obtained following intravenous administration of Tc-53mlabeled red blood cells.  RADIOPHARMACEUTICALS:  25 mCi Tc-958mn-vitro labeled red cells.  COMPARISON:  Abdomen and pelvis CT dated 07/25/2006.  FINDINGS: There is a small amount of progressive tracer accumulation in the right lower quadrant of the abdomen, extending superiorly from that location. No other abnormalities are seen.  IMPRESSION: Active gastrointestinal bleeding in the terminal ileum or right colon.  These results will be called to the ordering clinician or representative by the Radiologist Assistant, and communication documented in the PACS or zVision Dashboard.   Electronically Signed   By: StClaudie Revering.D.   On: 12/03/2014 18:51     CBC  Recent Labs Lab 12/03/14 1215 12/03/14 2148 12/04/14 0048 12/04/14 0509 12/04/14 0837  WBC 5.2  --   --  6.3  --   HGB 9.1* 9.7* 10.4* 9.4* 9.9*  HCT 27.9* 29.6* 32.3* 29.2* 30.4*  PLT 268  --   --  222  --   MCV 96.2  --   --  95.1  --   MCH 31.4  --   --  30.6  --   MCHC 32.6  --   --  32.2  --   RDW 14.3  --   --  14.9  --   LYMPHSABS 1.0  --   --   --   --   MONOABS 0.7  --   --   --   --   EOSABS 0.2  --   --   --   --   BASOSABS 0.0  --   --   --   --     Chemistries   Recent Labs Lab 12/03/14 1215 12/04/14 0509  NA 140 138  K 4.6 3.6  CL 109 110  CO2 25 23  GLUCOSE 94 89  BUN 15 11  CREATININE 0.87 0.76  CALCIUM 8.7* 8.1*  AST 42*  --   ALT 15*  --   ALKPHOS 38  --   BILITOT 1.0  --    ------------------------------------------------------------------------------------------------------------------ estimated creatinine clearance is 67.6 mL/min (by C-G formula based on Cr of 0.76). ------------------------------------------------------------------------------------------------------------------ No results for input(s): HGBA1C in the last 72  hours. ------------------------------------------------------------------------------------------------------------------ No results for input(s): CHOL, HDL, LDLCALC, TRIG, CHOLHDL, LDLDIRECT in the last 72 hours. ------------------------------------------------------------------------------------------------------------------ No results for input(s): TSH, T4TOTAL, T3FREE, THYROIDAB in the last 72 hours.  Invalid input(s): FREET3 ------------------------------------------------------------------------------------------------------------------  Recent Labs  12/03/14 2148  VITAMINB12 218  FOLATE 27.0  FERRITIN 19*  TIBC 304  IRON 164  RETICCTPCT 1.7    Coagulation profile  Recent Labs Lab 12/03/14 1215  INR 1.13    No results for input(s): DDIMER in the last 72 hours.  Cardiac Enzymes No results for input(s): CKMB, TROPONINI, MYOGLOBIN in the last 168 hours.  Invalid input(s): CK ------------------------------------------------------------------------------------------------------------------ Invalid input(s): POCBNP   Time Spent in minutes 35   Arrianna Catala K M.D on 12/04/2014 at 9:18 AM  Between 7am to 7pm - Pager - [phone]  After 7pm go to [email or web address] - password [facility]  [facility] -  Office  [phone]

## 2014-12-04 NOTE — Progress Notes (Signed)
Chaplain Note:   Chaplain visited with [name].   Pt is [name], he presented as warm, conversational and inviting. His spouse was bedside.   [name] noted they have been together 73 years.   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 They did not desire any at this time.   They did however enjoy the visit. They expressed having strong family support and healthy spirituality that keeps them hopeful. They site their grandchildren as a very profound joy in their lives.   Delford Field, Chaplain 12/04/2014 11:39 AM

## 2014-12-04 NOTE — Op Note (Signed)
[facility] [place], [id number]   COLONOSCOPY PROCEDURE REPORT     EXAM DATE: December 30, 2014  PATIENT NAME:      [name], [name]           MR #:      [id number]  BIRTHDATE:       05-19-1933      VISIT #:     [phone]  ATTENDING:     Teena Irani, MD     STATUS:     inpatient ASSISTANT:      Carolynn Comment and Danny Lawless, [place]   INDICATIONS:  The patient is a 81 yr old male here for a colonoscopy due to PROCEDURE PERFORMED:     colonoscopy with endoscopic clipping  MEDICATIONS:     75 g fentanyl, 5 mg Versed ESTIMATED BLOOD LOSS:     None  CONSENT: The patient understands the risks and benefits of the procedure and understands that these risks include, but are not limited to: sedation, allergic reaction, infection, perforation and/or bleeding. Alternative means of evaluation and treatment include, among others: physical exam, x-rays, and/or surgical intervention. The patient elects to proceed with this endoscopic procedure.  DESCRIPTION OF PROCEDURE: During intra-op preparation period all mechanical & medical equipment was checked for proper function. Hand hygiene and appropriate measures for infection prevention was taken. After the risks, benefits and alternatives of the procedure were thoroughly explained, Informed consent was verified, confirmed and timeout was successfully executed by the treatment team. A digital exam    The Pentax Ped Colon [id number] endoscope was introduced through the anus and advanced to the cecum     . the prep quality was good      The instrument was then slowly withdrawn as the colon was fully examined.Estimated blood loss is zero unless otherwise noted in this procedure report. there is no fresh or old blood seen in the colon. On a fold in the cecum however there is seen a small yellow slightly depressed ulcer which was using fresh blood. An Endo Clip was placed on this with cessation of  bleeding. The remainder the cecum and ascending transverse descending and sigmoid colon all appeared normal with the exception of an area of submucosal Niger ink in the transverse colon thought to be from a previous large polyp resection. There is no evidence of residual polyp.      retroflex view the anus was normal      The scope was then completely withdrawn from the patient and the procedure terminated. SCOPE WITHDRAWAL TIME:    ADVERSE EVENTS:      There were no immediate complications.  IMPRESSIONS:     cecal ulceration with hemorrhage, And not it with Endo Clip, no other bleeding source seen and no diverticuli specifically noted  RECOMMENDATIONS:     remain off of Plavix or other anticoagulates for 7 days. Will review history for NSAID use RECALL:  _____________________________ Teena Irani, MD eSigned:  Teena Irani, MD 12/30/14 3:37 PM   cc:   CPT CODES: ICD CODES:  The ICD and CPT codes recommended by this software are interpretations from the data that the clinical staff has captured with the software.  The verification of the translation of this report to the ICD and CPT codes and modifiers is the sole responsibility of the health care institution and practicing physician where this report was generated.  [employer] will not be held responsible for the validity of the ICD  and CPT codes included on this report.  AMA assumes no liability for data contained or not contained herein. CPT is a Designer, television/film set of the [employer].   PATIENT NAME:  [name], [name] MR#: [id number]

## 2014-12-05 LAB — HEMOGLOBIN AND HEMATOCRIT, BLOOD
HEMATOCRIT: 29.9 % — AB (ref 39.0–52.0)
Hemoglobin: 9.6 g/dL — ABNORMAL LOW (ref 13.0–17.0)

## 2014-12-05 MED ORDER — ASPIRIN 81 MG PO TBEC: 81.0000 mg | DELAYED_RELEASE_TABLET | Freq: Every day | ORAL | Status: DC

## 2014-12-05 MED ORDER — CLOPIDOGREL BISULFATE 75 MG PO TABS: 75.0000 mg | ORAL_TABLET | Freq: Every day | ORAL | Status: DC

## 2014-12-05 MED ORDER — FERROUS SULFATE 325 (65 FE) MG PO TABS: 325.0000 mg | ORAL_TABLET | Freq: Two times a day (BID) | ORAL | Status: AC

## 2014-12-05 NOTE — Discharge Summary (Signed)
Timothy HORRIGAN Sr., is a 81 y.o. male  DOB 27-Sep-1933  MRN [id number].  Admission date:  12/03/2014  Admitting Physician  Thurnell Lose, MD  Discharge Date:  12/05/2014   Primary MD  Mayra Neer, MD  Recommendations for primary care physician for things to follow:   CBC, BMP in a week   Admission Diagnosis  GI bleed [K92.2] Lower GI bleed [K92.2] Melanotic stools [K92.1]   Discharge Diagnosis  GI bleed [K92.2] Lower GI bleed [K92.2] Melanotic stools [K92.1]     Principal Problem:   H/O of GI bleed secondary to NSAIDs and DAPT Active Problems:   COPD GOLD II   Melena   Anemia associated with acute blood loss   Hypothyroidism   CAD -S/P LAD BMS '05, LAD DES 2013, cath OK 09/10/14   Dyslipidemia   Lower GI bleed   LGI bleed      Past Medical History  Diagnosis Date  . High cholesterol   . Anginal pain   . Myocardial infarction 1987  . Exertional dyspnea 01/04/2012  . History of blood transfusion 1987; 11/25/2014    "emergent CABG; anemia"  . Numbness and tingling in hands 01/04/2012    "right one goes to sleep on steering wheel when I'm driving; been that way long time; @ least 1 yr"  . Thyroid disease   . Coronary artery disease   . COPD (chronic obstructive pulmonary disease)   . Hyperlipidemia   . Complication of anesthesia 1987    "didn't wake up for 5 days"  . Hypothyroidism   . Anemia   . GI bleed due to NSAIDs 02/2012    Past Surgical History  Procedure Laterality Date  . Coronary angioplasty with stent placement  ? 1990's    "3"  . Coronary angioplasty with stent placement  01/04/2012    "2; total of 5"  . Wrist fracture surgery Left ~ 2009  . Wrist hardware removal Left ~ 2010    "took steel bar out"  . Colonoscopy w/ polypectomy  2011    "had to take it out in 3 pieces; all  benign"  . Cataract extraction w/ intraocular lens  implant, bilateral Bilateral 2012  . Esophagogastroduodenoscopy  02/07/2012    Procedure: ESOPHAGOGASTRODUODENOSCOPY (EGD);  Surgeon: Wonda Horner, MD;  Location[facility] ENDOSCOPY;  Service: Endoscopy;  Laterality: N/A;  . Percutaneous coronary stent intervention (pci-s) N/A 01/04/2012    Procedure: PERCUTANEOUS CORONARY STENT INTERVENTION (PCI-S);  Surgeon: Sinclair Grooms, MD;  Location: [facility] CATH LAB;  Service: Cardiovascular;  Laterality: N/A;  . Cardiac catheterization    . Cardiac catheterization N/A 09/10/2014    Procedure: Left Heart Cath and Cors/Grafts Angiography;  Surgeon: Peter M Martinique, MD;  Location: [facility] INVASIVE CV LAB CUPID;  Service: Cardiovascular;  Laterality: N/A;  . Coronary artery bypass graft  1987    CABG X1  . Esophagogastroduodenoscopy (egd) with propofol Left 11/26/2014    Procedure: ESOPHAGOGASTRODUODENOSCOPY (EGD) WITH PROPOFOL;  Surgeon: Arta Silence, MD;  Location: [place];  Service: Endoscopy;  Laterality: Left;       HPI  from the history and physical done on the day of admission:     [name] is a 81 y.o. male, With H/O GI Bleed, CAD - Stent in 2013, COPD, Hypothyroidism, Dyslipidemia, who was recently admitted to GI bleed with a negative EGD 1 week ago, on ASA-Plavix for CAD, comes in 1 day H/O bright red blood x 3 With some RLQ cramping , no preceding diarrhea no fevers.  Came to ER where H&H had a slight drop, he was feeling weak, BP and BUN stable, I was called to admit for a possible LGI bleed.    Hospital Course:     1. LGI Bleed - with tagged RBC scan showing bleeding in the terminal ileum area, got 1 unit of packed RBC on 12/03/2014 in the ER. H&H now stable he's symptom-free. Was seen by GI underwent colonoscopy confirming a cecal ulcer with bleeding which was clipped, stable H&H thereafter will be discharged home hold aspirin and Plavix for a week followed GI post discharge. Request PCP  to recheck CBC CMP next visit.   2. LGI Blood loss related Anemia - as in #1, anemia panel is stable except for low ferritin, placed on oral iron upon discharge. Outpatient follow-up with PCP.   3.CAD - stent in 2013, EKG stable, Hold ASA-Plavix for 1 week, statin to continue.   4.Hypothyroidism - on synthroid.   5.COPD - at baseline, supportive Rx.       Discharge Condition: Stable  Follow UP  Follow-up Information    Follow up with SHAW,KIMBERLEE, MD. Schedule an appointment as soon as possible for a visit in 1 week.   Specialty:  Family Medicine   Contact information:   [address] [place] [address] [phone]       Follow up with Cleotis Nipper, MD. Schedule an appointment as soon as possible for a visit in 1 week.   Specialty:  Gastroenterology   Contact information:   [address] [place] [address] [phone]        Consults obtained - GI  Diet and Activity recommendation: See Discharge Instructions below  Discharge Instructions       Discharge Instructions    Diet - low sodium heart healthy    Complete by:  As directed      Discharge instructions    Complete by:  As directed   Follow with Primary MD SHAW,KIMBERLEE, MD in 7 days   Get CBC, CMP, 2 view Chest X ray checked  by Primary MD next visit.    Activity: As tolerated with Full fall precautions use walker/cane & assistance as needed   Disposition Home     Diet: Heart Healthy    For Heart failure patients - Check your Weight same time everyday, if you gain over 2 pounds, or you develop in leg swelling, experience more shortness of breath or chest pain, call your Primary MD immediately. Follow Cardiac Low Salt Diet and 1.5 lit/day fluid restriction.   On your next visit with your primary care physician please Get Medicines reviewed and adjusted.   Please request your Prim.MD to go over all Hospital Tests and Procedure/Radiological results at  the follow up, please get all Hospital records sent to your Prim MD by signing hospital release before you go home.   If you experience worsening of your admission symptoms, develop shortness of breath, life threatening emergency, suicidal  or homicidal thoughts you must seek medical attention immediately by calling 911 or calling your MD immediately  if symptoms less severe.  You Must read complete instructions/literature along with all the possible adverse reactions/side effects for all the Medicines you take and that have been prescribed to you. Take any new Medicines after you have completely understood and accpet all the possible adverse reactions/side effects.   Do not drive, operating heavy machinery, perform activities at heights, swimming or participation in water activities or provide baby sitting services if your were admitted for syncope or siezures until you have seen by Primary MD or a Neurologist and advised to do so again.  Do not drive when taking Pain medications.    Do not take more than prescribed Pain, Sleep and Anxiety Medications  Special Instructions: If you have smoked or chewed Tobacco  in the last 2 yrs please stop smoking, stop any regular Alcohol  and or any Recreational drug use.  Wear Seat belts while driving.   Please note  You were cared for by a hospitalist during your hospital stay. If you have any questions about your discharge medications or the care you received while you were in the hospital after you are discharged, you can call the unit and asked to speak with the hospitalist on call if the hospitalist that took care of you is not available. Once you are discharged, your primary care physician will handle any further medical issues. Please note that NO REFILLS for any discharge medications will be authorized once you are discharged, as it is imperative that you return to your primary care physician (or establish a relationship with a primary care physician  if you do not have one) for your aftercare needs so that they can reassess your need for medications and monitor your lab values.     Increase activity slowly    Complete by:  As directed              Discharge Medications       Medication List    TAKE these medications        acetaminophen 325 MG tablet  Commonly known as:  TYLENOL  Take 2 tablets (650 mg total) by mouth every 4 (four) hours as needed for headache or mild pain.     aspirin 81 MG EC tablet  Take 1 tablet (81 mg total) by mouth daily.  Start taking on:  12/12/2014     clopidogrel 75 MG tablet  Commonly known as:  PLAVIX  Take 1 tablet (75 mg total) by mouth daily.  Start taking on:  12/11/2014     ferrous sulfate 325 (65 FE) MG tablet  Take 1 tablet (325 mg total) by mouth 2 (two) times daily with a meal.     levothyroxine 100 MCG tablet  Commonly known as:  SYNTHROID, LEVOTHROID  Take 100 mcg by mouth daily before breakfast.     multivitamin with minerals Tabs tablet  Take 1 tablet by mouth daily.     nitroGLYCERIN 0.4 MG SL tablet  Commonly known as:  NITROSTAT  Place 1 tablet (0.4 mg total) under the tongue every 5 (five) minutes as needed. For chest pain     pantoprazole 40 MG tablet  Commonly known as:  PROTONIX  Take 40 mg by mouth daily.     rosuvastatin 10 MG tablet  Commonly known as:  CRESTOR  Take 10 mg by mouth daily.        Major procedures  and Radiology Reports - PLEASE review detailed and final reports for all details, in brief -    Colonoscopy  IMPRESSIONS: cecal ulceration with hemorrhage, And not it with Endo Clip, no other bleeding source seen and no diverticuli specifically noted  RECOMMENDATIONS: remain off of Plavix or other anticoagulates for 7 days. Will review history for NSAID use RECALL:  _____________________________ Teena Irani, MD eSigned: Teena Irani, MD 12/04/2014 3:37 PM    Nm Gi Blood Loss  12/03/2014   CLINICAL DATA:  Large amount of  blood in the stools today.  EXAM: NUCLEAR MEDICINE GASTROINTESTINAL BLEEDING SCAN  TECHNIQUE: Sequential abdominal images were obtained following intravenous administration of Tc-23mlabeled red blood cells.  RADIOPHARMACEUTICALS:  25 mCi Tc-950mn-vitro labeled red cells.  COMPARISON:  Abdomen and pelvis CT dated 07/25/2006.  FINDINGS: There is a small amount of progressive tracer accumulation in the right lower quadrant of the abdomen, extending superiorly from that location. No other abnormalities are seen.  IMPRESSION: Active gastrointestinal bleeding in the terminal ileum or right colon.  These results will be called to the ordering clinician or representative by the Radiologist Assistant, and communication documented in the PACS or zVision Dashboard.   Electronically Signed   By: StClaudie Revering.D.   On: 12/03/2014 18:51    Micro Results      Recent Results (from the past 240 hour(s))  MRSA PCR Screening     Status: None   Collection Time: 12/03/14  7:21 PM  Result Value Ref Range Status   MRSA by PCR NEGATIVE NEGATIVE Final    Comment:        The GeneXpert MRSA Assay (FDA approved for NASAL specimens only), is one component of a comprehensive MRSA colonization surveillance program. It is not intended to diagnose MRSA infection nor to guide or monitor treatment for MRSA infections.        Today   Subjective    JuAmericus Perkeyoday has no headache,no chest abdominal pain,no new weakness tingling or numbness, feels much better wants to go home today.     Objective   Blood pressure 113/51, pulse 61, temperature 97.4 F (36.3 C), temperature source Oral, resp. rate 10, height '5\' 7"'$  (1.702 m), weight 58.9 kg (129 lb 13.6 oz), SpO2 96 %.   Intake/Output Summary (Last 24 hours) at 12/05/14 0753 Last data filed at 12/05/14 0500  Gross per 24 hour  Intake   1403 ml  Output   1201 ml  Net    202 ml    Exam Awake Alert, Oriented x 3, No new F.N deficits, Normal  affect .AT,PERRAL Supple Neck,No JVD, No cervical lymphadenopathy appriciated.  Symmetrical Chest wall movement, Good air movement bilaterally, CTAB RRR,No Gallops,Rubs or new Murmurs, No Parasternal Heave +ve B.Sounds, Abd Soft, Non tender, No organomegaly appriciated, No rebound -guarding or rigidity. No Cyanosis, Clubbing or edema, No new Rash or bruise   Data Review   CBC w Diff: Lab Results  Component Value Date   WBC 6.3 12/04/2014   HGB 9.6* 12/05/2014   HCT 29.9* 12/05/2014   PLT 222 12/04/2014   LYMPHOPCT 19 12/03/2014   MONOPCT 13* 12/03/2014   EOSPCT 4 12/03/2014   BASOPCT 1 12/03/2014    CMP: Lab Results  Component Value Date   NA 138 12/04/2014   K 3.6 12/04/2014   CL 110 12/04/2014   CO2 23 12/04/2014   BUN 11 12/04/2014   CREATININE 0.76 12/04/2014   PROT 5.9* 12/03/2014   ALBUMIN  3.2* 12/03/2014   BILITOT 1.0 12/03/2014   ALKPHOS 38 12/03/2014   AST 42* 12/03/2014   ALT 15* 12/03/2014  .   Total Time in preparing paper work, data evaluation and todays exam - 35 minutes  Thurnell Lose M.D on 12/05/2014 at 7:53 AM  [facility]   Office  [phone]

## 2014-12-05 NOTE — Discharge Instructions (Signed)
Follow with Primary MD Mayra Neer, MD in 7 days   Get CBC, CMP, 2 view Chest X ray checked  by Primary MD next visit.    Activity: As tolerated with Full fall precautions use walker/cane & assistance as needed   Disposition Home     Diet: Heart Healthy    For Heart failure patients - Check your Weight same time everyday, if you gain over 2 pounds, or you develop in leg swelling, experience more shortness of breath or chest pain, call your Primary MD immediately. Follow Cardiac Low Salt Diet and 1.5 lit/day fluid restriction.   On your next visit with your primary care physician please Get Medicines reviewed and adjusted.   Please request your Prim.MD to go over all Hospital Tests and Procedure/Radiological results at the follow up, please get all Hospital records sent to your Prim MD by signing hospital release before you go home.   If you experience worsening of your admission symptoms, develop shortness of breath, life threatening emergency, suicidal or homicidal thoughts you must seek medical attention immediately by calling 911 or calling your MD immediately  if symptoms less severe.  You Must read complete instructions/literature along with all the possible adverse reactions/side effects for all the Medicines you take and that have been prescribed to you. Take any new Medicines after you have completely understood and accpet all the possible adverse reactions/side effects.   Do not drive, operating heavy machinery, perform activities at heights, swimming or participation in water activities or provide baby sitting services if your were admitted for syncope or siezures until you have seen by Primary MD or a Neurologist and advised to do so again.  Do not drive when taking Pain medications.    Do not take more than prescribed Pain, Sleep and Anxiety Medications  Special Instructions: If you have smoked or chewed Tobacco  in the last 2 yrs please stop smoking, stop any regular  Alcohol  and or any Recreational drug use.  Wear Seat belts while driving.   Please note  You were cared for by a hospitalist during your hospital stay. If you have any questions about your discharge medications or the care you received while you were in the hospital after you are discharged, you can call the unit and asked to speak with the hospitalist on call if the hospitalist that took care of you is not available. Once you are discharged, your primary care physician will handle any further medical issues. Please note that NO REFILLS for any discharge medications will be authorized once you are discharged, as it is imperative that you return to your primary care physician (or establish a relationship with a primary care physician if you do not have one) for your aftercare needs so that they can reassess your need for medications and monitor your lab values.

## 2014-12-05 NOTE — Progress Notes (Signed)
Pt vitals stable. Discharge summary and education discussed and pt verbalized understanding. Pt taken in a wheelchair to wife's vehicle by NT. Dorena Cookey, RN

## 2014-12-07 ENCOUNTER — Encounter (HOSPITAL_COMMUNITY): Payer: Self-pay | Admitting: Gastroenterology

## 2014-12-19 ENCOUNTER — Other Ambulatory Visit: Payer: Self-pay | Admitting: Cardiology

## 2014-12-28 ENCOUNTER — Ambulatory Visit (INDEPENDENT_AMBULATORY_CARE_PROVIDER_SITE_OTHER): Payer: Medicare Other | Admitting: Cardiology

## 2014-12-28 ENCOUNTER — Ambulatory Visit
Admission: RE | Admit: 2014-12-28 | Discharge: 2014-12-28 | Disposition: A | Discharge status: Home / Self Care | Payer: Medicare Other | Source: Ambulatory Visit | Attending: Cardiology | Admitting: Cardiology

## 2014-12-28 ENCOUNTER — Encounter: Payer: Self-pay | Admitting: Cardiology

## 2014-12-28 VITALS — BP 116/62 | HR 76 | Ht 67.0 in | Wt 133.0 lb

## 2014-12-28 DIAGNOSIS — I2510 Atherosclerotic heart disease of native coronary artery without angina pectoris: Secondary | ICD-10-CM

## 2014-12-28 DIAGNOSIS — I951 Orthostatic hypotension: Secondary | ICD-10-CM | POA: Diagnosis not present

## 2014-12-28 DIAGNOSIS — Z9861 Coronary angioplasty status: Secondary | ICD-10-CM | POA: Diagnosis not present

## 2014-12-28 DIAGNOSIS — R0781 Pleurodynia: Secondary | ICD-10-CM | POA: Diagnosis not present

## 2014-12-28 NOTE — Progress Notes (Signed)
[facility]. [address], [address] [place], [place]  [address] Phone: [phone] Fax:  [phone]  Date:  12/28/2014   ID:  Timothy Millin Sr., DOB 1933-08-24, MRN [id number]  PCP:  Timothy Neer, MD   History of Present Illness: Timothy BECVAR Sr. is a 81 y.o. male with coronary artery disease status post stent, drug eluting, to LAD (01/05/12)  Previouslyon dual antiplatelet therapy who was previously taking NSAIDs for approximally 3 weeks duration for pleuritic chest discomfort. He ended up having a severely decreased hemoglobin and underwent blood transfusion. Upper GI/endoscopy demonstrated small AVM and subsequent hospitalization with repeat GI bleed showed cecal ulcer , clipping took place. 3 units of blood. Hospitalization was 12/05/14.   cardiac catheterization on 09/10/14:  Prox RCA lesion, 100% stenosed.  Dist Graft lesion, 25% stenosed.  RPDA lesion, 40% stenosed.  LM lesion, 35% stenosed.  Ost LAD to Prox LAD lesion, 35% stenosed.  Prox Cx to Mid Cx lesion, 10% stenosed.  1. Single vessel occlusive CAD 2. Patent SVG to PDA. 3. Patent stents in the mid LAD 4. Good LV function.  Recommendations: continue medical management.    lower GI bleed was analyzed with tagged RBC scan showing bleeding in the terminal ileum , 1 unit of PRBC. A cecal ulcer was confirmed , clipped.   He hast felt "swimmy headed" for quite some time. Orthostatics have been common for him. Feels that it is back. Tingling in hands and legs. Feels some palpitations with exertion. Feels SOB with walking up stairs.   unfortunately, he stumbled and fell down 3 stairs. His left lower rib cage is still bothering him. Sometimes hurts when taking in a deep breath. This seems to be improving somewhat but it still is hurting him.    Wt Readings from Last 3 Encounters:  12/28/14 133 lb (60.328 kg)  12/05/14 129 lb 13.6 oz (58.9 kg)  11/26/14 140 lb 10.5 oz (63.8 kg)     Past Medical History  Diagnosis  Date  . High cholesterol   . Anginal pain   . Myocardial infarction 1987  . Exertional dyspnea 01/04/2012  . History of blood transfusion 1987; 11/25/2014    "emergent CABG; anemia"  . Numbness and tingling in hands 01/04/2012    "right one goes to sleep on steering wheel when I'm driving; been that way long time; @ least 1 yr"  . Thyroid disease   . Coronary artery disease   . COPD (chronic obstructive pulmonary disease)   . Hyperlipidemia   . Complication of anesthesia 1987    "didn't wake up for 5 days"  . Hypothyroidism   . Anemia   . GI bleed due to NSAIDs 02/2012    Past Surgical History  Procedure Laterality Date  . Coronary angioplasty with stent placement  ? 1990's    "3"  . Coronary angioplasty with stent placement  01/04/2012    "2; total of 5"  . Wrist fracture surgery Left ~ 2009  . Wrist hardware removal Left ~ 2010    "took steel bar out"  . Colonoscopy w/ polypectomy  2011    "had to take it out in 3 pieces; all benign"  . Cataract extraction w/ intraocular lens  implant, bilateral Bilateral 2012  . Esophagogastroduodenoscopy  02/07/2012    Procedure: ESOPHAGOGASTRODUODENOSCOPY (EGD);  Surgeon: Timothy Horner, MD;  Location: [facility] ENDOSCOPY;  Service: Endoscopy;  Laterality: N/A;  . Percutaneous coronary stent intervention (pci-s) N/A 01/04/2012  Procedure: PERCUTANEOUS CORONARY STENT INTERVENTION (PCI-S);  Surgeon: Timothy Grooms, MD;  Location: [facility] CATH LAB;  Service: Cardiovascular;  Laterality: N/A;  . Cardiac catheterization    . Cardiac catheterization N/A 09/10/2014    Procedure: Left Heart Cath and Cors/Grafts Angiography;  Surgeon: Timothy M Martinique, MD;  Location: [facility] INVASIVE CV LAB CUPID;  Service: Cardiovascular;  Laterality: N/A;  . Coronary artery bypass graft  1987    CABG X1  . Esophagogastroduodenoscopy (egd) with propofol Left 11/26/2014    Procedure: ESOPHAGOGASTRODUODENOSCOPY (EGD) WITH PROPOFOL;  Surgeon: Timothy Silence, MD;  Location[facility] ENDOSCOPY;   Service: Endoscopy;  Laterality: Left;  . Colonoscopy N/A 12/04/2014    Procedure: COLONOSCOPY;  Surgeon: Timothy Irani, MD;  Location: [place];  Service: Endoscopy;  Laterality: N/A;    Current Outpatient Prescriptions  Medication Sig Dispense Refill  . acetaminophen (TYLENOL) 325 MG tablet Take 2 tablets (650 mg total) by mouth every 4 (four) hours as needed for headache or mild pain.    . ferrous sulfate 325 (65 FE) MG tablet Take 1 tablet (325 mg total) by mouth 2 (two) times daily with a meal. 60 tablet 0  . levothyroxine (SYNTHROID, LEVOTHROID) 100 MCG tablet Take 100 mcg by mouth daily before breakfast.    . Multiple Vitamin (MULTIVITAMIN WITH MINERALS) TABS Take 1 tablet by mouth daily.    . nitroGLYCERIN (NITROSTAT) 0.4 MG SL tablet Place 1 tablet (0.4 mg total) under the tongue every 5 (five) minutes as needed. For chest pain 25 tablet 0  . pantoprazole (PROTONIX) 40 MG tablet Take 40 mg by mouth daily.    . rosuvastatin (CRESTOR) 10 MG tablet Take 10 mg by mouth daily.     No current facility-administered medications for this visit.    Allergies:    Allergies  Allergen Reactions  . Ibuprofen Other (See Comments)    Internal bleeding  . Morphine And Related Nausea And Vomiting  . Atorvastatin     Muscle pain    Social History:  The patient  reports that he quit smoking about 26 years ago. His smoking use included Cigarettes. He has a 40 pack-year smoking history. He has never used smokeless tobacco. He reports that he does not drink alcohol or use illicit drugs.  Security guard.  ROS:  Please see the history of present illness.   Denies any syncope, bleeding, orthopnea, PND  PHYSICAL EXAM: VS:  BP 116/62 mmHg  Pulse 76  Ht '5\' 7"'$  (1.702 m)  Wt 133 lb (60.328 kg)  BMI 20.83 kg/m2 Thin, in no acute distress HEENT: normal Neck: no JVD Cardiac:  normal S1, S2; RRR; no murmur Lungs:  clear to auscultation bilaterally, no wheezing, rhonchi or rales his left lower  anterior rib cage is slightly tender to touch. I do not appreciate any displaced fractures. Abd: soft, nontender, no hepatomegaly Ext: no edema Skin: warm and dry Neuro: no focal abnormalities noted  EKG:  Sinus bradycardia with mild sinus arrhythmia, heart rate 55 beats per minute. No other abnormalities.  Labs: 07/07/13-hemoglobin 11.5-stable, LDL 94  ASSESSMENT AND PLAN:  1.  chest pain, rib pain, left lower , post fall. I will check a chest x-ray to ensure that he does not have any serious rib fractures or potential underlying pneumothorax. 2. Coronary artery disease-status post bypass, previous percutaneous intervention, DES 2013. LAD.  No longer on doing a platelet therapy because of recurrent GI bleeds. 3. Hyperlipidemia-continue with Crestor. Previously had had issues with cost but  this has been rectified by his insurance company. LDL goal is 70. Last LDL 94. Continue to encourage statin use. Exercise, diet. 4. History of bypass surgery-as above. Aggressive secondary prevention. Unfortunately he cannot utilize beta blocker because of hypotension.  We cannot use aspirin or Plavix either because of his recurrent GI bleeds. 5. Orthostatic hypotension - stopped metoprolol 12.5 mg in the past. This is a very minor/low-dose, however hopefully this will help. He also has mild bradycardia. Counseled him on compression hose. Getting up slowly. Adequate hydration, sports drink. Liberalize salt. 6. Six-month followup  Signed, Candee Furbish, MD [facility]  12/28/2014 3:30 PM

## 2014-12-28 NOTE — Patient Instructions (Signed)
Medication Instructions:  Your physician recommends that you continue on your current medications as directed. Please refer to the Current Medication list given to you today.  Testing/Procedures: A chest x-ray takes a picture of the organs and structures inside the chest, including the heart, lungs, and blood vessels. This test can show several things, including, whether the heart is enlarges; whether fluid is building up in the lungs; and whether pacemaker / defibrillator leads are still in place.  Follow-Up: Follow up in 6 months with Dr. Marlou Porch.  You will receive a letter in the mail 2 months before you are due.  Please call us when you receive this letter to schedule your follow up appointment.  Thank you for choosing [facility]!!

## 2014-12-29 ENCOUNTER — Telehealth: Payer: Self-pay

## 2014-12-29 NOTE — Telephone Encounter (Signed)
-----   Message from Jerline Pain, MD sent at 12/29/2014  6:31 AM EDT ----- Reassuring XRAY, no fractures.  Extensive stable emphysematous and interstitial lung changes. Candee Furbish, MD

## 2014-12-29 NOTE — Telephone Encounter (Signed)
Called to give pt cxr results. lmtcb

## 2014-12-30 NOTE — Telephone Encounter (Signed)
Pt and wife are aware of Cx ray results. Pt verbalized understanding.

## 2014-12-30 NOTE — Telephone Encounter (Signed)
Follow up     Pt returning call regarding results

## 2015-01-13 ENCOUNTER — Other Ambulatory Visit: Payer: Self-pay | Admitting: Physician Assistant

## 2015-01-13 DIAGNOSIS — R1032 Left lower quadrant pain: Secondary | ICD-10-CM

## 2015-01-13 DIAGNOSIS — R1031 Right lower quadrant pain: Secondary | ICD-10-CM

## 2015-01-14 ENCOUNTER — Ambulatory Visit
Admission: RE | Admit: 2015-01-14 | Discharge: 2015-01-14 | Disposition: A | Discharge status: Home / Self Care | Payer: Medicare Other | Source: Ambulatory Visit | Attending: Physician Assistant | Admitting: Physician Assistant

## 2015-01-14 ENCOUNTER — Other Ambulatory Visit: Payer: Self-pay | Admitting: Physician Assistant

## 2015-01-14 DIAGNOSIS — R1032 Left lower quadrant pain: Secondary | ICD-10-CM

## 2015-01-14 DIAGNOSIS — R1031 Right lower quadrant pain: Secondary | ICD-10-CM

## 2015-01-20 ENCOUNTER — Ambulatory Visit
Admission: RE | Admit: 2015-01-20 | Discharge: 2015-01-20 | Disposition: A | Discharge status: Home / Self Care | Payer: Medicare Other | Source: Ambulatory Visit | Attending: Physician Assistant | Admitting: Physician Assistant

## 2015-01-20 DIAGNOSIS — R1031 Right lower quadrant pain: Secondary | ICD-10-CM

## 2015-01-20 DIAGNOSIS — R1032 Left lower quadrant pain: Secondary | ICD-10-CM

## 2015-01-20 MED ORDER — IOPAMIDOL (ISOVUE-300) INJECTION 61%
100.0000 mL | Freq: Once | INTRAVENOUS | Status: AC | PRN
  Administered 2015-01-20: 100 mL via INTRAVENOUS

## 2015-02-18 ENCOUNTER — Encounter: Payer: Self-pay | Admitting: Vascular Surgery

## 2015-02-23 ENCOUNTER — Encounter: Payer: Self-pay | Admitting: Vascular Surgery

## 2015-02-23 ENCOUNTER — Ambulatory Visit (INDEPENDENT_AMBULATORY_CARE_PROVIDER_SITE_OTHER): Payer: Medicare Other | Admitting: Vascular Surgery

## 2015-02-23 VITALS — BP 110/63 | HR 62 | Temp 98.1°F | Resp 16 | Ht 65.0 in | Wt 135.0 lb

## 2015-02-23 DIAGNOSIS — K551 Chronic vascular disorders of intestine: Secondary | ICD-10-CM

## 2015-02-23 DIAGNOSIS — I771 Stricture of artery: Secondary | ICD-10-CM

## 2015-02-23 NOTE — Progress Notes (Signed)
HISTORY AND PHYSICAL     CC:  SMA stenosis  Referring Provider:  Mayra Neer, MD  HPI: This is a 81 y.o. male who has a hx of GIB resulting in acute blood loss anemia and is followed by [facility].  He had a bleeding cecal ulcer, which was clipped by Dr. Amedeo Plenty.  His hgb has been stable since then .  His Plavix/asa was discontinued due to the GIB.    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  The pt states that he does have some bloating/swelling around his abdomen.  He states that he has minimal nausea and denies nausea with eating.  He has had some weight loss, but this has been stable since July when he had the GIB.    He states that he has had some dizziness with getting up after lying down.  He has had several falls as well.    He is on a statin for cholesterol management.  He takes iron as well for anemia.  He is on synthroid for hypothyroidism.    He has a remote hx of tobacco use.  Past Medical History  Diagnosis Date  . High cholesterol   . Anginal pain ([place])   . Myocardial infarction ([place]) 1987  . Exertional dyspnea 01/04/2012  . History of blood transfusion 1987; 11/25/2014    "emergent CABG; anemia"  . Numbness and tingling in hands 01/04/2012    "right one goes to sleep on steering wheel when I'm driving; been that way long time; @ least 1 yr"  . Thyroid disease   . Coronary artery disease   . COPD (chronic obstructive pulmonary disease) ([place])   . Hyperlipidemia   . Complication of anesthesia 1987    "didn't wake up for 5 days"  . Hypothyroidism   . Anemia   . GI bleed due to NSAIDs 02/2012    Past Surgical History  Procedure Laterality Date  . Coronary angioplasty with stent placement  ? 1990's    "3"  . Coronary angioplasty with stent placement  01/04/2012    "2; total of 5"  . Wrist fracture surgery Left ~ 2009  . Wrist hardware removal Left ~ 2010    "took steel bar out"  . Colonoscopy w/ polypectomy  2011    "had to take  it out in 3 pieces; all benign"  . Cataract extraction w/ intraocular lens  implant, bilateral Bilateral 2012  . Esophagogastroduodenoscopy  02/07/2012    Procedure: ESOPHAGOGASTRODUODENOSCOPY (EGD);  Surgeon: Wonda Horner, MD;  [facility]: [facility] ENDOSCOPY;  Service: Endoscopy;  Laterality: N/A;  . Percutaneous coronary stent intervention (pci-s) N/A 01/04/2012    Procedure: PERCUTANEOUS CORONARY STENT INTERVENTION (PCI-S);  Surgeon: Sinclair Grooms, MD;  Location: [facility] CATH LAB;  Service: Cardiovascular;  Laterality: N/A;  . Cardiac catheterization    . Cardiac catheterization N/A 09/10/2014    Procedure: Left Heart Cath and Cors/Grafts Angiography;  Surgeon: Peter M Martinique, MD;  Location: [facility] INVASIVE CV LAB CUPID;  Service: Cardiovascular;  Laterality: N/A;  . Coronary artery bypass graft  1987    CABG X1  . Esophagogastroduodenoscopy (egd) with propofol Left 11/26/2014    Procedure: ESOPHAGOGASTRODUODENOSCOPY (EGD) WITH PROPOFOL;  Surgeon: Arta Silence, MD;  Location: [facility] ENDOSCOPY;  Service: Endoscopy;  Laterality: Left;  . Colonoscopy N/A 12/04/2014    Procedure: COLONOSCOPY;  Surgeon: Teena Irani, MD;  Location: [facility];  Service: Endoscopy;  Laterality: N/A;  Allergies  Allergen Reactions  . Ibuprofen Other (See Comments)    Internal bleeding  . Morphine And Related Nausea And Vomiting  . Atorvastatin     Muscle pain    Current Outpatient Prescriptions  Medication Sig Dispense Refill  . acetaminophen (TYLENOL) 325 MG tablet Take 2 tablets (650 mg total) by mouth every 4 (four) hours as needed for headache or mild pain.    Marland Kitchen aspirin 81 MG tablet Take 81 mg by mouth daily.    . ferrous sulfate 325 (65 FE) MG tablet Take 1 tablet (325 mg total) by mouth 2 (two) times daily with a meal. 60 tablet 0  . nitroGLYCERIN (NITROSTAT) 0.4 MG SL tablet Place 1 tablet (0.4 mg total) under the tongue every 5 (five) minutes as needed. For chest pain 25 tablet 0  . pantoprazole (PROTONIX) 40 MG  tablet Take 40 mg by mouth daily.    . rosuvastatin (CRESTOR) 10 MG tablet Take 10 mg by mouth daily.    Marland Kitchen levothyroxine (SYNTHROID, LEVOTHROID) 100 MCG tablet Take 100 mcg by mouth daily before breakfast.    . Multiple Vitamin (MULTIVITAMIN WITH MINERALS) TABS Take 1 tablet by mouth daily.     No current facility-administered medications for this visit.    Family History  Problem Relation Age of Onset  . Emphysema Father     ? if he smoked or not     Social History   Social History  . Marital Status: Married    Spouse Name: N/A  . Number of Children: N/A  . Years of Education: N/A   Occupational History  . Not on file.   Social History Main Topics  . Smoking status: Former Smoker -- 1.00 packs/day for 40 years    Types: Cigarettes    Quit date: 05/08/1988  . Smokeless tobacco: Never Used  . Alcohol Use: No     Comment: 11/25/2014 "hadn't drank any alcohol in the 2000's"  . Drug Use: No  . Sexual Activity: Yes   Other Topics Concern  . Not on file   Social History Narrative   ** Merged History Encounter **         ROS: '[x]'$  Positive   '[ ]'$  Negative   '[ ]'$  All sytems reviewed and are negative  Cardiovascular: '[x]'$  chest pain/pressure '[]'$  palpitations '[x]'$  SOB lying flat '[x]'$  DOE '[]'$  pain in legs while walking '[]'$  pain in feet when lying flat '[]'$  hx of DVT '[]'$  hx of phlebitis '[x]'$  swelling in legs '[]'$  varicose veins  Pulmonary: '[]'$  productive cough '[]'$  asthma '[x]'$  wheezing  Neurologic: '[x]'$  weakness in  arms or  legs '[x]'$  numbness in arms or legs '[x]'$ difficulty speaking or slurred speech '[]'$  temporary loss of vision in one eye '[x]'$  dizziness  Hematologic: '[]'$  bleeding problems '[]'$  problems with blood clotting easily  GI '[]'$  vomiting blood '[x]'$  blood in stool '[x]'$  recent hx of GIB  GU: '[]'$  burning with urination '[]'$  blood in urine  Psychiatric: '[]'$  hx of major depression  Integumentary: '[]'$  rashes '[]'$  ulcers  Constitutional: '[]'$  fever '[]'$  chills   PHYSICAL  EXAMINATION:  Filed Vitals:   02/23/15 1305  BP: 110/63  Pulse: 62  Temp: 98.1 F (36.7 C)  Resp: 16   Body mass index is 22.47 kg/(m^2).  General:  WD thin male in NAD Gait: Not observed HENT: WNL, normocephalic Pulmonary: normal non-labored breathing , without Rales, rhonchi,  wheezing Cardiac: RRR, without  Murmurs, rubs or gallops; without carotid bruits Abdomen: soft,  NT, no masses; +normal bowel sounds;  no abdominal bruits Skin: without rashes, without ulcers  Vascular Exam/Pulses:  Right Left  Radial 2+ (normal) 2+ (normal)  Femoral 2+ (normal) 2+ (normal)  PT 2+ (normal) 2+ (normal)   Extremities: without ischemic changes, without Gangrene , without cellulitis; without open wounds;  Musculoskeletal: no muscle wasting or atrophy  Neurologic: A&O X 3; Appropriate Affect ; SENSATION: normal; MOTOR FUNCTION:  moving all extremities equally. Speech is fluent/normal   Non-Invasive Vascular Imaging:   CT scan 01/20/15: IMPRESSION: Atherosclerosis of abdominal aorta is noted without aneurysm formation.  Calcified plaque is noted at the origin of the superior mesenteric artery resulting in severe focal stenosis with poststenoticvdilatation.  No acute abnormality seen in the abdomen or pelvis  Pt meds includes: Statin:  Yes.   Beta Blocker:  No. Aspirin:  No. ACEI:  No. ARB:  No. Other Antiplatelet/Anticoagulant:  No.    ASSESSMENT/PLAN:: 81 y.o. male with hx of GIB and incidental finding of SMA stenosis   -CT scan reviewed and compared to CT scan in 2008.  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  He did have the stenosis back in 2008, but it has progressed.   -the pt does not have typical symptoms of mesenteric ischemia such as post prandial pain, N/V with eating.  He has had some weight loss, but this seems to have stabilized since July.   -Dr. Donnetta Hutching does not feel that that pt's symptoms or bleeding are coming from the  SMA stenosis. -we will see the pt back PRN and will be glad to see the pt back if he worsens or his sx change -pt and family reassured that there is no abdominal aortic aneurysm present on CT scan.    Leontine Locket, PA-C [facility] [phone]  Clinic MD:  Pt seen and examined in conjunction with Dr. Donnetta Hutching  I have examined the patient, reviewed and agree with above. The patient's symptoms are certainly not typical for this enteric ischemia. He also abdominal pain. He reports a fullness in his midepigastrium and also feels that he is actually some swelling in this area. He has no pain specifically related to eating. Reports some pain in his back as well. Fortunately has had no further GI bleeding. I discussed his incidental finding with the patient, his wife and daughter present. 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 I would not recommend arteriography and stenting of his SMA stenosis does not appear to be causing his symptoms. If he has a predominant symptom is more related to postprandial pain would consider further evaluation of this.  Curt Jews, MD 02/23/2015 3:23 PM

## 2015-03-19 ENCOUNTER — Other Ambulatory Visit: Payer: Self-pay | Admitting: Cardiology

## 2015-03-19 NOTE — Telephone Encounter (Signed)
Per last office visit with Dr Marlou Porch, patient is to be off of this medication. [facility] is requesting a refill. I have spoke with the patient and he stated that he thinks that Dr Donnetta Hutching told him that he could start back on it since he only had some blockage?? He asked that I call his wife to confirm this. I spoke with the wife and she was unsure but stated that she would call Dr Luther Parody office for advise. I later received a call back from her and she stated that he has been taking up until now, but is going to stop until they hear back from [place] or Dr Luther Parody office. Please advise. Thanks, MI

## 2015-04-18 ENCOUNTER — Other Ambulatory Visit: Payer: Self-pay | Admitting: Cardiology

## 2015-04-19 ENCOUNTER — Other Ambulatory Visit: Payer: Self-pay | Admitting: *Deleted

## 2015-04-19 MED ORDER — ROSUVASTATIN CALCIUM 10 MG PO TABS: 10.0000 mg | ORAL_TABLET | Freq: Every day | ORAL | Status: DC

## 2015-06-29 ENCOUNTER — Ambulatory Visit: Payer: Medicare Other | Admitting: Cardiology

## 2015-07-27 ENCOUNTER — Encounter: Payer: Self-pay | Admitting: Cardiology

## 2015-08-01 ENCOUNTER — Emergency Department (HOSPITAL_COMMUNITY): Payer: Medicare Other

## 2015-08-01 ENCOUNTER — Emergency Department (HOSPITAL_COMMUNITY)
Admission: EM | Admit: 2015-08-01 | Discharge: 2015-08-01 | Disposition: A | Discharge status: Home / Self Care | Payer: Medicare Other | Source: Home / Self Care | Attending: Emergency Medicine | Admitting: Emergency Medicine

## 2015-08-01 ENCOUNTER — Encounter (HOSPITAL_COMMUNITY): Payer: Self-pay | Admitting: *Deleted

## 2015-08-01 ENCOUNTER — Emergency Department (INDEPENDENT_AMBULATORY_CARE_PROVIDER_SITE_OTHER): Payer: Medicare Other

## 2015-08-01 DIAGNOSIS — J189 Pneumonia, unspecified organism: Secondary | ICD-10-CM | POA: Diagnosis not present

## 2015-08-01 MED ORDER — CEFTRIAXONE SODIUM 1 G IJ SOLR
1.0000 g | Freq: Once | INTRAMUSCULAR | Status: AC
  Administered 2015-08-01: 1 g via INTRAMUSCULAR

## 2015-08-01 MED ORDER — CEFTRIAXONE SODIUM 1 G IJ SOLR
INTRAMUSCULAR | Status: AC
  Filled 2015-08-01: qty 10

## 2015-08-01 MED ORDER — AZITHROMYCIN 250 MG PO TABS
ORAL_TABLET | ORAL | Status: AC
  Filled 2015-08-01: qty 2

## 2015-08-01 MED ORDER — LIDOCAINE HCL 2 % IJ SOLN
INTRAMUSCULAR | Status: AC
  Filled 2015-08-01: qty 20

## 2015-08-01 MED ORDER — AZITHROMYCIN 250 MG PO TABS
500.0000 mg | ORAL_TABLET | Freq: Once | ORAL | Status: AC
  Administered 2015-08-01: 500 mg via ORAL

## 2015-08-01 MED ORDER — AZITHROMYCIN 250 MG PO TABS: 250.0000 mg | ORAL_TABLET | Freq: Every day | ORAL | Status: DC

## 2015-08-01 NOTE — ED Provider Notes (Signed)
CSN: [id number]     Arrival date & time 08/01/15  1440 History   First MD Initiated Contact with Patient 08/01/15 1452     Chief Complaint  Patient presents with  . Cough   (Consider location/radiation/quality/duration/timing/severity/associated sxs/prior Treatment) Patient is a 82 y.o. male presenting with cough. The history is provided by the patient. No language interpreter was used.  Cough Cough characteristics:  Productive Severity:  Moderate Onset quality:  Gradual Duration:  5 days Timing:  Constant Progression:  Worsening Chronicity:  New Smoker: no   Context: sick contacts   Relieved by:  Nothing Worsened by:  Nothing tried Ineffective treatments:  None tried Associated symptoms: fever   Associated symptoms: no chest pain   Risk factors: no recent infection   Pt was seen by his Md 5 days ago.   Pt complains of being sick since being in his doctors office  Past Medical History  Diagnosis Date  . High cholesterol   . Anginal pain ([place])   . Myocardial infarction ([place]) 1987  . Exertional dyspnea 01/04/2012  . History of blood transfusion 1987; 11/25/2014    "emergent CABG; anemia"  . Numbness and tingling in hands 01/04/2012    "right one goes to sleep on steering wheel when I'm driving; been that way long time; @ least 1 yr"  . Thyroid disease   . Coronary artery disease   . COPD (chronic obstructive pulmonary disease) ([place])   . Hyperlipidemia   . Complication of anesthesia 1987    "didn't wake up for 5 days"  . Hypothyroidism   . Anemia   . GI bleed due to NSAIDs 02/2012   Past Surgical History  Procedure Laterality Date  . Coronary angioplasty with stent placement  ? 1990's    "3"  . Coronary angioplasty with stent placement  01/04/2012    "2; total of 5"  . Wrist fracture surgery Left ~ 2009  . Wrist hardware removal Left ~ 2010    "took steel bar out"  . Colonoscopy w/ polypectomy  2011    "had to take it out in 3 pieces; all benign"  . Cataract  extraction w/ intraocular lens  implant, bilateral Bilateral 2012  . Esophagogastroduodenoscopy  02/07/2012    Procedure: ESOPHAGOGASTRODUODENOSCOPY (EGD);  Surgeon: Wonda Horner, MD;  Location: [facility] ENDOSCOPY;  Service: Endoscopy;  Laterality: N/A;  . Percutaneous coronary stent intervention (pci-s) N/A 01/04/2012    Procedure: PERCUTANEOUS CORONARY STENT INTERVENTION (PCI-S);  Surgeon: Sinclair Grooms, MD;  Location: [facility] CATH LAB;  Service: Cardiovascular;  Laterality: N/A;  . Cardiac catheterization    . Cardiac catheterization N/A 09/10/2014    Procedure: Left Heart Cath and Cors/Grafts Angiography;  Surgeon: Peter M Martinique, MD;  Location: [facility] INVASIVE CV LAB CUPID;  Service: Cardiovascular;  Laterality: N/A;  . Coronary artery bypass graft  1987    CABG X1  . Esophagogastroduodenoscopy (egd) with propofol Left 11/26/2014    Procedure: ESOPHAGOGASTRODUODENOSCOPY (EGD) WITH PROPOFOL;  Surgeon: Arta Silence, MD;  Location: [facility] ENDOSCOPY;  Service: Endoscopy;  Laterality: Left;  . Colonoscopy N/A 12/04/2014    Procedure: COLONOSCOPY;  Surgeon: Teena Irani, MD;  Location: [place];  Service: Endoscopy;  Laterality: N/A;   Family History  Problem Relation Age of Onset  . Emphysema Father     ? if he smoked or not    Social History  Substance Use Topics  . Smoking status: Former Smoker -- 1.00 packs/day for 40 years    Types:  Cigarettes    Quit date: 05/08/1988  . Smokeless tobacco: Never Used  . Alcohol Use: No     Comment: 11/25/2014 "hadn't drank any alcohol in the 2000's"    Review of Systems  Constitutional: Positive for fever.  Respiratory: Positive for cough.   Cardiovascular: Negative for chest pain.  All other systems reviewed and are negative.   Allergies  Ibuprofen; Morphine and related; and Atorvastatin  Home Medications   Prior to Admission medications   Medication Sig Start Date End Date Taking? Authorizing Provider  acetaminophen (TYLENOL) 325 MG tablet Take 2  tablets (650 mg total) by mouth every 4 (four) hours as needed for headache or mild pain. 09/10/14   Erlene Quan, PA-C  aspirin 81 MG tablet Take 81 mg by mouth daily.    Historical Provider, MD  azithromycin (ZITHROMAX) 250 MG tablet Take 1 tablet (250 mg total) by mouth daily. Take first 2 tablets together, then 1 every day until finished. 08/01/15   Fransico Meadow, PA-C  ferrous sulfate 325 (65 FE) MG tablet Take 1 tablet (325 mg total) by mouth 2 (two) times daily with a meal. 12/05/14   Thurnell Lose, MD  levothyroxine (SYNTHROID, LEVOTHROID) 100 MCG tablet Take 100 mcg by mouth daily before breakfast.    Historical Provider, MD  Multiple Vitamin (MULTIVITAMIN WITH MINERALS) TABS Take 1 tablet by mouth daily.    Historical Provider, MD  nitroGLYCERIN (NITROSTAT) 0.4 MG SL tablet Place 1 tablet (0.4 mg total) under the tongue every 5 (five) minutes as needed. For chest pain 11/16/14   Jerline Pain, MD  pantoprazole (PROTONIX) 40 MG tablet Take 40 mg by mouth daily.    Historical Provider, MD  rosuvastatin (CRESTOR) 10 MG tablet Take 1 tablet (10 mg total) by mouth daily. 04/19/15   Jerline Pain, MD   Meds Ordered and Administered this Visit   Medications  cefTRIAXone (ROCEPHIN) injection 1 g (not administered)  azithromycin (ZITHROMAX) tablet 500 mg (not administered)    BP 113/66 mmHg  Pulse 95  Temp(Src) 99.1 F (37.3 C) (Oral)  Resp 16  SpO2 93% No data found.   Physical Exam  Constitutional: He is oriented to person, place, and time. He appears well-developed and well-nourished.  HENT:  Head: Normocephalic and atraumatic.  Right Ear: External ear normal.  Left Ear: External ear normal.  Mouth/Throat: Oropharynx is clear and moist.  Eyes: Conjunctivae and EOM are normal. Pupils are equal, round, and reactive to light.  Neck: Normal range of motion.  Cardiovascular: Normal rate and normal heart sounds.   Pulmonary/Chest: Effort normal.  Abdominal: Soft. He exhibits no  distension.  Musculoskeletal: Normal range of motion.  Neurological: He is alert and oriented to person, place, and time.  Skin: Skin is warm.  Psychiatric: He has a normal mood and affect.  Nursing note and vitals reviewed.   ED Course  Procedures (including critical care time)  Labs Review Labs Reviewed - No data to display  Imaging Review Dg Chest 2 View  08/01/2015  CLINICAL DATA:  Cough, tightness and pain in lower chest, hx of open heart surgery EXAM: CHEST  2 VIEW COMPARISON:  12/28/2014 FINDINGS: Sternotomy wires overlie normal cardiac silhouette. There is chronic interstitial lung disease. There is biapical nodular scarring greater on the RIGHT and unchanged from prior. There is increased reticular nodular pattern over the RIGHT hemidiaphragm. IMPRESSION: Increased reticular nodular pattern of the RIGHT hemidiaphragm consistent worsening interstitial lung disease versus a acute  infectious process. No change in apical scarring and diffuse interstitial lung disease per Electronically Signed   By: Suzy Bouchard M.D.   On: 08/01/2015 15:24     Visual Acuity Review  Right Eye Distance:   Left Eye Distance:   Bilateral Distance:    Right Eye Near:   Left Eye Near:    Bilateral Near:         MDM   Pt advised he needs to see his MD tomorrow for recheck.   He will also need follow chest xray to assure clearing.  Pt given 1st dose here.  Pt advised to ED if increasing shortness of breath or problem.   1. Community acquired pneumonia    Meds ordered this encounter  Medications  . cefTRIAXone (ROCEPHIN) injection 1 g    Sig:     Order Specific Question:  Antibiotic Indication:    Answer:  CAP  . azithromycin (ZITHROMAX) 250 MG tablet    Sig: Take 1 tablet (250 mg total) by mouth daily. Take first 2 tablets together, then 1 every day until finished.    Dispense:  6 tablet    Refill:  0    Order Specific Question:  Supervising Provider    Answer:  Billy Fischer [phone]   . azithromycin (ZITHROMAX) tablet 500 mg    Sig:    An After Visit Summary was printed and given to the patient.   Fransico Meadow, PA-C 08/01/15 [place], PA-C 08/01/15 [phone]

## 2015-08-01 NOTE — ED Notes (Signed)
Pt  Reports    Symptoms  Of  Cough   Congested       With symptoms            Since     5  Days   Ago      he  Reports  Dizzy  As  Well  As       sorethroat        And  Congestion   Saw  His  pcp    5  Days  Before  Symptoms  Started  And  Checked  Out  OK

## 2015-08-01 NOTE — Discharge Instructions (Signed)

## 2015-08-16 ENCOUNTER — Ambulatory Visit
Admission: RE | Admit: 2015-08-16 | Discharge: 2015-08-16 | Disposition: A | Discharge status: Home / Self Care | Payer: Medicare Other | Source: Ambulatory Visit | Attending: Family Medicine | Admitting: Family Medicine

## 2015-08-16 ENCOUNTER — Other Ambulatory Visit: Payer: Self-pay | Admitting: Family Medicine

## 2015-08-16 DIAGNOSIS — R52 Pain, unspecified: Secondary | ICD-10-CM

## 2015-08-30 ENCOUNTER — Ambulatory Visit
Admission: RE | Admit: 2015-08-30 | Discharge: 2015-08-30 | Disposition: A | Discharge status: Home / Self Care | Payer: Medicare Other | Source: Ambulatory Visit | Attending: Family Medicine | Admitting: Family Medicine

## 2015-08-30 ENCOUNTER — Other Ambulatory Visit: Payer: Self-pay | Admitting: Family Medicine

## 2015-08-30 DIAGNOSIS — J181 Lobar pneumonia, unspecified organism: Principal | ICD-10-CM

## 2015-08-30 DIAGNOSIS — J189 Pneumonia, unspecified organism: Secondary | ICD-10-CM

## 2016-01-06 ENCOUNTER — Other Ambulatory Visit: Payer: Self-pay | Admitting: Cardiology

## 2016-02-13 ENCOUNTER — Other Ambulatory Visit: Payer: Self-pay | Admitting: Cardiology

## 2016-02-27 ENCOUNTER — Other Ambulatory Visit: Payer: Self-pay | Admitting: Cardiology

## 2016-02-28 ENCOUNTER — Encounter: Payer: Self-pay | Admitting: Cardiology

## 2016-02-28 ENCOUNTER — Ambulatory Visit (INDEPENDENT_AMBULATORY_CARE_PROVIDER_SITE_OTHER): Payer: Medicare Other | Admitting: Cardiology

## 2016-02-28 ENCOUNTER — Encounter (INDEPENDENT_AMBULATORY_CARE_PROVIDER_SITE_OTHER): Payer: Self-pay

## 2016-02-28 VITALS — BP 120/56 | HR 68 | Ht 67.0 in | Wt 137.0 lb

## 2016-02-28 DIAGNOSIS — R42 Dizziness and giddiness: Secondary | ICD-10-CM

## 2016-02-28 DIAGNOSIS — I2510 Atherosclerotic heart disease of native coronary artery without angina pectoris: Secondary | ICD-10-CM

## 2016-02-28 DIAGNOSIS — R0781 Pleurodynia: Secondary | ICD-10-CM | POA: Diagnosis not present

## 2016-02-28 MED ORDER — ROSUVASTATIN CALCIUM 10 MG PO TABS: 10.0000 mg | ORAL_TABLET | Freq: Every day | ORAL | 3 refills | Status: DC

## 2016-02-28 NOTE — Progress Notes (Signed)
[facility] [address], [place]  [address] Phone: [phone] Fax:  [phone]  Date:  02/28/2016   ID:  Timothy Millin Sr., DOB [birth year], MRN [id number]  PCP:  Mayra Neer, MD   History of Present Illness: Timothy BURKEL Sr. is a 82 y.o. male with coronary artery disease status post stent, drug eluting, to LAD (01/05/12)   Prior GI bleed Upper GI/endoscopy demonstrated small AVM and subsequent hospitalization with repeat GI bleed showed cecal ulcer , clipping took place. 3 units of blood. Hospitalization was 12/05/14. Now back on aspirin.   Cardiac catheterization on 09/10/14:  Prox RCA lesion, 100% stenosed.  Dist Graft lesion, 25% stenosed.  RPDA lesion, 40% stenosed.  LM lesion, 35% stenosed.  Ost LAD to Prox LAD lesion, 35% stenosed.  Prox Cx to Mid Cx lesion, 10% stenosed.  1. Single vessel occlusive CAD 2. Patent SVG to PDA. 3. Patent stents in the mid LAD 4. Good LV function.  Recommendations: continue medical management.    lower GI bleed was analyzed with tagged RBC scan showing bleeding in the terminal ileum , 1 unit of PRBC. A cecal ulcer was confirmed , clipped.   He hast felt "swimmy headed" for quite some time. Sometimes he feels unsteady on his feet, has to hold onto the wall when he walks. Sometimes may fall on the driveway. He does not state that he loses consciousness or vision does not change as far as signals of hypotension.   Unfortunately, he stumbled and fell down 3 stairs. His left lower rib cage is still bothering him. Sometimes hurts when taking in a deep breath. This seems to be improving somewhat but it still is hurting him.  Still has some baseline shortness of breath. Dizziness really seems to be his main complaint.    Wt Readings from Last 3 Encounters:  02/28/16 137 lb (62.1 kg)  02/23/15 135 lb (61.2 kg)  12/28/14 133 lb (60.3 kg)     Past Medical History:  Diagnosis Date  . Anemia   . Anginal pain ([place])    . Complication of anesthesia 1987   "didn't wake up for 5 days"  . COPD (chronic obstructive pulmonary disease) ([place])   . Coronary artery disease   . Exertional dyspnea 01/04/2012  . GI bleed due to NSAIDs 02/2012  . High cholesterol   . History of blood transfusion 1987; 11/25/2014   "emergent CABG; anemia"  . Hyperlipidemia   . Hypothyroidism   . Myocardial infarction 1987  . Numbness and tingling in hands 01/04/2012   "right one goes to sleep on steering wheel when I'm driving; been that way long time; @ least 1 yr"  . Thyroid disease     Past Surgical History:  Procedure Laterality Date  . CARDIAC CATHETERIZATION    . CARDIAC CATHETERIZATION N/A 09/10/2014   Procedure: Left Heart Cath and Cors/Grafts Angiography;  Surgeon: Peter M Martinique, MD;  Location: [facility] INVASIVE CV LAB CUPID;  Service: Cardiovascular;  Laterality: N/A;  . CATARACT EXTRACTION W/ INTRAOCULAR LENS  IMPLANT, BILATERAL Bilateral 2012  . COLONOSCOPY N/A 12/04/2014   Procedure: COLONOSCOPY;  Surgeon: Teena Irani, MD;  Location: [facility] ENDOSCOPY;  Service: Endoscopy;  Laterality: N/A;  . COLONOSCOPY W/ POLYPECTOMY  2011   "had to take it out in 3 pieces; all benign"  . CORONARY ANGIOPLASTY WITH STENT PLACEMENT  ? 1990's   "3"  . CORONARY ANGIOPLASTY WITH STENT PLACEMENT  01/04/2012   "2;  total of 5"  . CORONARY ARTERY BYPASS GRAFT  1987   CABG X1  . ESOPHAGOGASTRODUODENOSCOPY  02/07/2012   Procedure: ESOPHAGOGASTRODUODENOSCOPY (EGD);  Surgeon: Wonda Horner, MD;  Location: [facility] ENDOSCOPY;  Service: Endoscopy;  Laterality: N/A;  . ESOPHAGOGASTRODUODENOSCOPY (EGD) WITH PROPOFOL Left 11/26/2014   Procedure: ESOPHAGOGASTRODUODENOSCOPY (EGD) WITH PROPOFOL;  Surgeon: Arta Silence, MD;  Location: [facility] ENDOSCOPY;  Service: Endoscopy;  Laterality: Left;  . PERCUTANEOUS CORONARY STENT INTERVENTION (PCI-S) N/A 01/04/2012   Procedure: PERCUTANEOUS CORONARY STENT INTERVENTION (PCI-S);  Surgeon: Sinclair Grooms, MD;  Location: [facility] CATH LAB;   Service: Cardiovascular;  Laterality: N/A;  . WRIST FRACTURE SURGERY Left ~ 2009  . WRIST HARDWARE REMOVAL Left ~ 2010   "took steel bar out"    Current Outpatient Prescriptions  Medication Sig Dispense Refill  . acetaminophen (TYLENOL) 325 MG tablet Take 2 tablets (650 mg total) by mouth every 4 (four) hours as needed for headache or mild pain.    Marland Kitchen aspirin 81 MG tablet Take 81 mg by mouth daily.    Marland Kitchen azithromycin (ZITHROMAX) 250 MG tablet Take 1 tablet (250 mg total) by mouth daily. Take first 2 tablets together, then 1 every day until finished. 6 tablet 0  . ferrous sulfate 325 (65 FE) MG tablet Take 1 tablet (325 mg total) by mouth 2 (two) times daily with a meal. 60 tablet 0  . levothyroxine (SYNTHROID, LEVOTHROID) 50 MCG tablet Take 50 mcg by mouth daily.    . Multiple Vitamin (MULTIVITAMIN WITH MINERALS) TABS Take 1 tablet by mouth daily.    . nitroGLYCERIN (NITROSTAT) 0.4 MG SL tablet Place 1 tablet (0.4 mg total) under the tongue every 5 (five) minutes as needed. For chest pain 25 tablet 0  . pantoprazole (PROTONIX) 40 MG tablet Take 40 mg by mouth daily.    . rosuvastatin (CRESTOR) 10 MG tablet Take 1 tablet (10 mg total) by mouth daily. 90 tablet 3   No current facility-administered medications for this visit.     Allergies:    Allergies  Allergen Reactions  . Ibuprofen Other (See Comments)    Internal bleeding  . Morphine And Related Nausea And Vomiting  . Atorvastatin     Muscle pain    Social History:  The patient  reports that he quit smoking about 27 years ago. His smoking use included Cigarettes. He has a 40.00 pack-year smoking history. He has never used smokeless tobacco. He reports that he does not drink alcohol or use drugs.  Security guard.  ROS:  Please see the history of present illness.   Denies any syncope, bleeding, orthopnea, PND  PHYSICAL EXAM: VS:  BP (!) 120/56   Pulse 68   Ht '5\' 7"'$  (1.702 m)   Wt 137 lb (62.1 kg)   BMI 21.46 kg/m  Thin, in  no acute distress  HEENT: normal  Neck: no JVD  Cardiac:  normal S1, S2; RRR; no murmur  Lungs:  clear to auscultation bilaterally, no wheezing, rhonchi or rales  his left lower anterior rib cage is slightly tender to touch. I do not appreciate any displaced fractures. Abd: soft, nontender, no hepatomegaly  Ext: no edema  Skin: warm and dry  Neuro: no focal abnormalities noted  EKG:  EKG today 02/28/2016-sinus rhythm, 63 with no other abnormalities. Sinus bradycardia with mild sinus arrhythmia, heart rate 55 beats per minute. No other abnormalities.  Labs: 07/07/13-hemoglobin 11.5-stable, LDL 94  ASSESSMENT AND PLAN:  1. Prior Chest pain, rib pain,  left lower , post fall 12/2014. Chest x-ray reassuring (old chronic COPD changes, aortic atherosceloris). 2. Coronary artery disease-status post bypass, previous percutaneous intervention, DES 2013. LAD.  No longer on dual anti platelet therapy because of recurrent GI bleeds. Aspirin only. Making sure that he is off of his Plavix. 3. 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 No AAA. No typical mesenteric ischemia symptoms. Dr. Donnetta Hutching.  4. Hyperlipidemia-continue with Crestor or generic. Previously had had issues with cost but this has been rectified by his insurance company. LDL goal is 70. Last LDL 94. Continue to encourage statin use. Exercise, diet. 5. History of bypass surgery-as above. Aggressive secondary prevention.    6. Consider vestibular therapy-no syncope, he is having trouble with disequilibrium. 7. Six-month followup  Signed, Candee Furbish, MD [facility]  02/28/2016 2:01 PM

## 2016-02-28 NOTE — Patient Instructions (Signed)
Medication Instructions:  Please stop your Plavix. Continue all other medications as listed.  Follow-Up: Follow up in 6 months with Dr. Marlou Porch.  You will receive a letter in the mail 2 months before you are due.  Please call us when you receive this letter to schedule your follow up appointment.  If you need a refill on your cardiac medications before your next appointment, please call your pharmacy.  Thank you for choosing [facility]!!    Please consider vestibular therapy

## 2016-05-04 IMAGING — CR DG CHEST 2V
2 series · 2 of 2 positions shown · non-contrast
Comparison: Chest CTA 01/12/2012 and earlier.

CLINICAL DATA: 80-year-old male with hemoptysis. Initial encounter.

EXAM:
CHEST  2 VIEW

[view not recorded (1 of 2)]
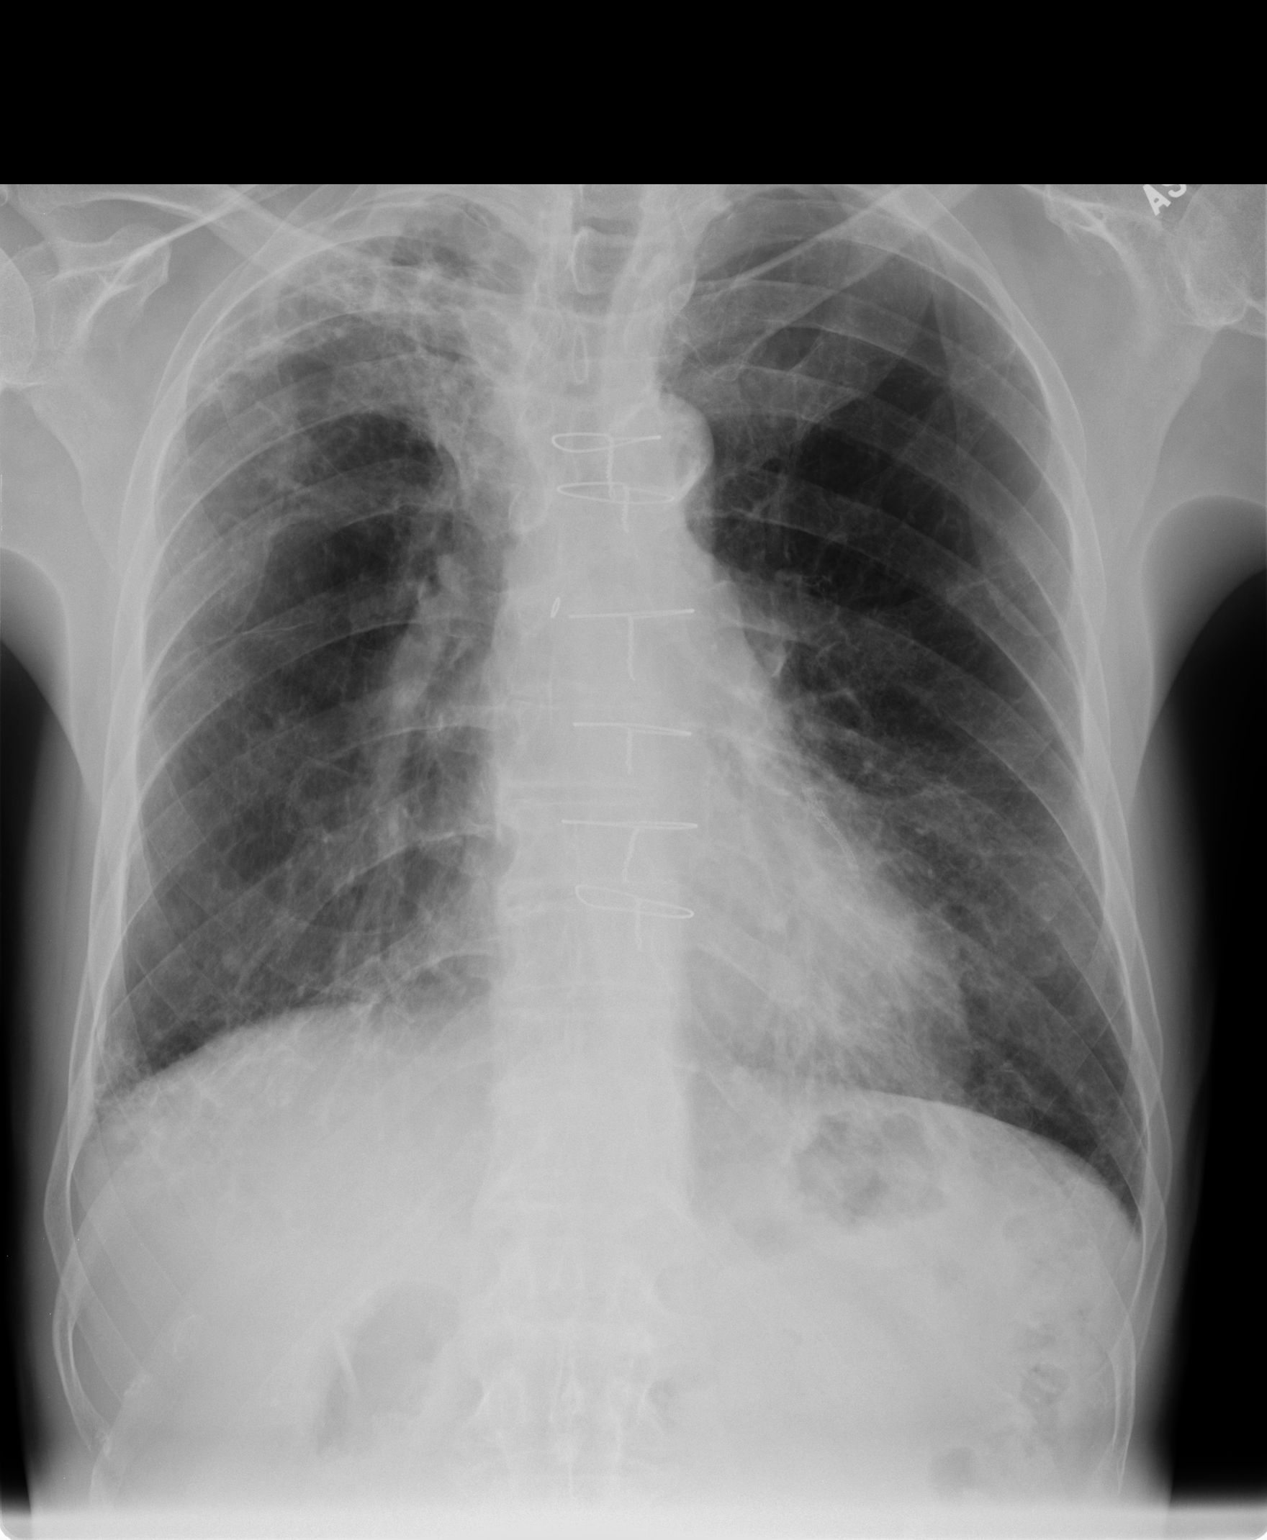

[view not recorded (2 of 2)]
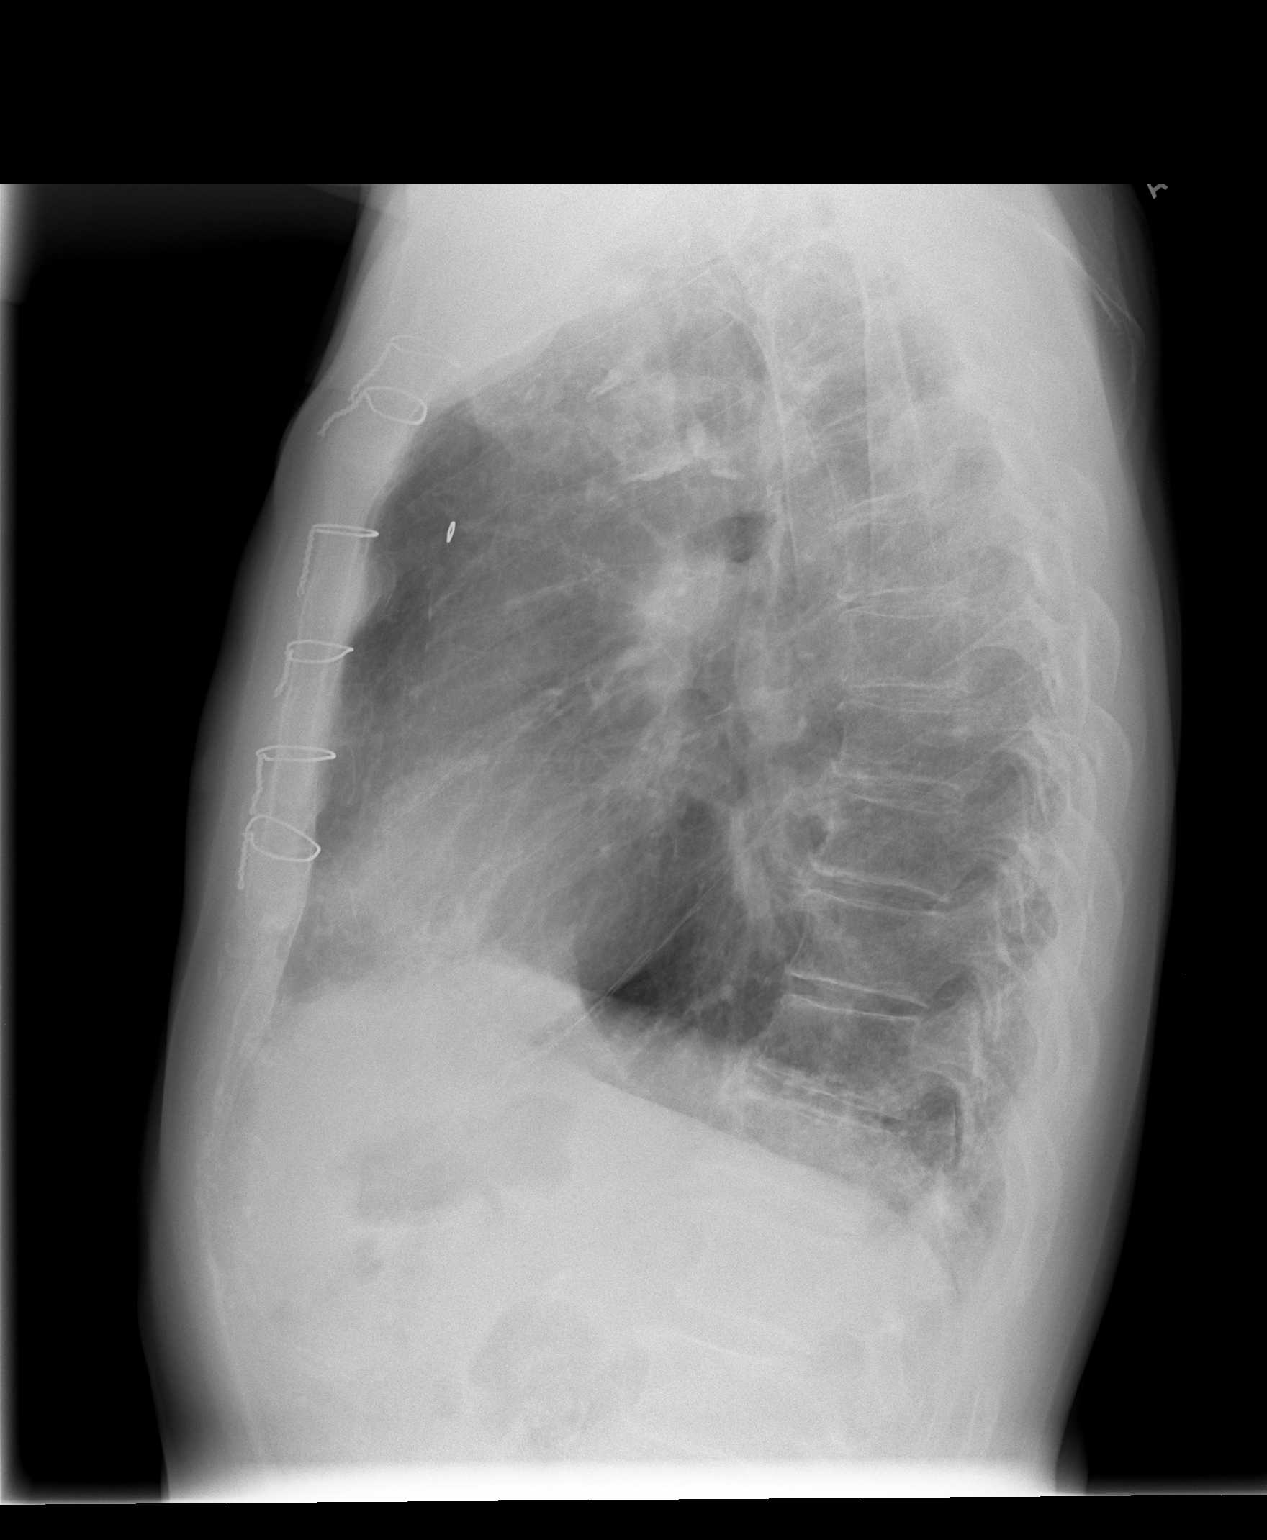

[2 of 2 positions shown; findings below may reference images not displayed]

FINDINGS: Chronic emphysema and right upper lobe lung disease with
architectural distortion. Chronic lower lobe honeycombing versus
paraseptal emphysema with scarring. Stable lung volumes since 2226.
No pneumothorax or pulmonary edema. No pleural effusion or acute
pulmonary opacity identified. Previous median sternotomy. Stable
cardiac size and mediastinal contours. Right hilar retraction
related to the upper lobe lung disease. Calcified atherosclerosis of
the aorta. Visualized tracheal air column is within normal limits.
No acute osseous abnormality identified.
IMPRESSION: Severe chronic lung disease. No superimposed acute findings are
identified.

## 2016-05-17 ENCOUNTER — Other Ambulatory Visit: Payer: Self-pay | Admitting: Cardiology

## 2016-05-18 NOTE — Telephone Encounter (Signed)
Rx refill sent to pharmacy. 

## 2017-01-18 ENCOUNTER — Other Ambulatory Visit: Payer: Self-pay | Admitting: Cardiology

## 2017-03-02 ENCOUNTER — Other Ambulatory Visit: Payer: Self-pay | Admitting: Family Medicine

## 2017-03-02 ENCOUNTER — Ambulatory Visit
Admission: RE | Admit: 2017-03-02 | Discharge: 2017-03-02 | Disposition: A | Discharge status: Home / Self Care | Payer: Medicare Other | Source: Ambulatory Visit | Attending: Family Medicine | Admitting: Family Medicine

## 2017-03-02 DIAGNOSIS — R634 Abnormal weight loss: Secondary | ICD-10-CM

## 2017-03-02 DIAGNOSIS — R042 Hemoptysis: Secondary | ICD-10-CM

## 2017-03-02 DIAGNOSIS — R1013 Epigastric pain: Secondary | ICD-10-CM

## 2017-03-07 ENCOUNTER — Ambulatory Visit
Admission: RE | Admit: 2017-03-07 | Discharge: 2017-03-07 | Disposition: A | Discharge status: Home / Self Care | Payer: Medicare Other | Source: Ambulatory Visit | Attending: Family Medicine | Admitting: Family Medicine

## 2017-03-07 ENCOUNTER — Other Ambulatory Visit: Payer: Self-pay | Admitting: Family Medicine

## 2017-03-07 DIAGNOSIS — R1013 Epigastric pain: Secondary | ICD-10-CM

## 2017-03-07 DIAGNOSIS — R634 Abnormal weight loss: Secondary | ICD-10-CM

## 2017-03-07 DIAGNOSIS — R042 Hemoptysis: Secondary | ICD-10-CM

## 2017-03-13 ENCOUNTER — Ambulatory Visit
Admission: RE | Admit: 2017-03-13 | Discharge: 2017-03-13 | Disposition: A | Discharge status: Home / Self Care | Payer: Medicare Other | Source: Ambulatory Visit | Attending: Family Medicine | Admitting: Family Medicine

## 2017-03-13 DIAGNOSIS — R042 Hemoptysis: Secondary | ICD-10-CM

## 2017-03-13 MED ORDER — IOPAMIDOL (ISOVUE-300) INJECTION 61%
75.0000 mL | Freq: Once | INTRAVENOUS | Status: AC | PRN
  Administered 2017-03-13: 75 mL via INTRAVENOUS

## 2017-03-25 ENCOUNTER — Other Ambulatory Visit: Payer: Self-pay | Admitting: Cardiology

## 2017-04-10 ENCOUNTER — Ambulatory Visit (INDEPENDENT_AMBULATORY_CARE_PROVIDER_SITE_OTHER): Payer: Medicare Other | Admitting: Pulmonary Disease

## 2017-04-10 VITALS — BP 118/70 | HR 73 | Ht 68.0 in | Wt 128.4 lb

## 2017-04-10 DIAGNOSIS — J849 Interstitial pulmonary disease, unspecified: Secondary | ICD-10-CM | POA: Diagnosis not present

## 2017-04-10 DIAGNOSIS — J432 Centrilobular emphysema: Secondary | ICD-10-CM

## 2017-04-10 NOTE — Progress Notes (Signed)
Subjective:   PATIENT ID: [name]. GENDER: male DOB: 1933-08-22, MRN: [id number]   Synopsis: Referred in December 2018 for an abnormal CT scan of the chest.   HPI  Chief complaint: Referred for evaluation of an abnormal CT chest by primary care doctor Dr. Manuella Ghazi  Mr. Panepinto was referred to me for evaluation of an abnormal Chest CT.  Dr. Brigitte Pulse referred him here for further evaluation.    He notes some some dyspnea on exertion: > climbing stairs makes him short of breath > he will have some associated sharp "pulling" pain in his chest when he is dyspneic > In the last year his dyspnea has worsened, particualrly when climbs stairs > he wheezes > he has never had a severe episode of pneumonia  He had an episode of hemoptysis a few weeks back.  He says that he was coughing up blood nearly every morning for a few weeks.  He thinks this stopped spontaneously a month.  Dr. Brigitte Pulse didn't prescribe anything new.   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  He was working up until recently doing security.  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    He smoked in the past, 1 pack per day.  He quit about 15 years ago.  Prior to that he smoked for about 50 years.    Cough: > he denies this  He says that as a child he had no lung problems until 1987.  In 1987 he was diagnosed with CAD and had a CABG.  He had this done at [facility].  He has been followed by the cardiology team since.  Sine then he had some stents placed a few times.  He notes a lot of pain all over, worse in his abdomen.    Past Medical History:  Diagnosis Date  . Anemia   . Anginal pain ([place])   . Complication of anesthesia 1987   "didn't wake up for 5 days"  . COPD (chronic obstructive pulmonary disease) ([place])   . Coronary artery disease   . Exertional dyspnea 01/04/2012  . GI bleed due to NSAIDs 02/2012  . High cholesterol   . History of blood  transfusion 1987; 11/25/2014   "emergent CABG; anemia"  . Hyperlipidemia   . Hypothyroidism   . Myocardial infarction 1987  . Numbness and tingling in hands 01/04/2012   "right one goes to sleep on steering wheel when I'm driving; been that way long time; @ least 1 yr"  . Thyroid disease      Family History  Problem Relation Age of Onset  . Emphysema Father        ? if he smoked or not      Social History   Socioeconomic History  . Marital status: Married    Spouse name: Not on file  . Number of children: Not on file  . Years of education: Not on file  . Highest education level: Not on file  Social Needs  . Financial resource strain: Not on file  . Food insecurity - worry: Not on file  . Food insecurity - inability: Not on file  . Transportation needs - medical: Not on file  . Transportation needs - non-medical: Not on file  Occupational History  . Not on file  Tobacco Use  . Smoking status: Former Smoker    Packs/day: 1.00  Years: 40.00    Pack years: 40.00    Types: Cigarettes    Last attempt to quit: 05/08/1988    Years since quitting: 28.9  . Smokeless tobacco: Never Used  Substance and Sexual Activity  . Alcohol use: No    Comment: 11/25/2014 "hadn't drank any alcohol in the 2000's"  . Drug use: No  . Sexual activity: Yes  Other Topics Concern  . Not on file  Social History Narrative   ** Merged History Encounter **         Allergies  Allergen Reactions  . Ibuprofen Other (See Comments)    Internal bleeding  . Morphine And Related Nausea And Vomiting  . Atorvastatin     Muscle pain     Outpatient Medications Prior to Visit  Medication Sig Dispense Refill  . acetaminophen (TYLENOL) 325 MG tablet Take 2 tablets (650 mg total) by mouth every 4 (four) hours as needed for headache or mild pain.    Marland Kitchen aspirin 81 MG tablet Take 81 mg by mouth daily.    Marland Kitchen azithromycin (ZITHROMAX) 250 MG tablet Take 1 tablet (250 mg total) by mouth daily. Take first 2 tablets  together, then 1 every day until finished. 6 tablet 0  . ferrous sulfate 325 (65 FE) MG tablet Take 1 tablet (325 mg total) by mouth 2 (two) times daily with a meal. 60 tablet 0  . levothyroxine (SYNTHROID, LEVOTHROID) 50 MCG tablet Take 50 mcg by mouth daily.    . Multiple Vitamin (MULTIVITAMIN WITH MINERALS) TABS Take 1 tablet by mouth daily.    Marland Kitchen NITROSTAT 0.4 MG SL tablet PLACE 1 TABLET (0.4 MG TOTAL) UNDER THE TONGUE EVERY 5 (FIVE) MINUTES AS NEEDED. FOR CHEST PAIN 25 tablet 2  . pantoprazole (PROTONIX) 40 MG tablet Take 40 mg by mouth daily.    . rosuvastatin (CRESTOR) 10 MG tablet Take 1 tablet (10 mg total) daily by mouth. Please make overdue yearly appt with Dr. Marlou Porch before anymore refills. 2nd attempt 15 tablet 0   No facility-administered medications prior to visit.     Review of Systems  Constitutional: Positive for malaise/fatigue. Negative for chills, fever and weight loss.  HENT: Negative for congestion, nosebleeds, sinus pain and sore throat.   Eyes: Negative for photophobia, pain and discharge.  Respiratory: Positive for shortness of breath. Negative for cough, hemoptysis, sputum production and wheezing.   Cardiovascular: Negative for chest pain, palpitations, orthopnea and leg swelling.  Gastrointestinal: Positive for abdominal pain. Negative for constipation, diarrhea, nausea and vomiting.  Genitourinary: Negative for dysuria, frequency, hematuria and urgency.  Musculoskeletal: Negative for back pain, joint pain, myalgias and neck pain.  Skin: Negative for itching and rash.  Neurological: Negative for tingling, tremors, sensory change, speech change, focal weakness, seizures, weakness and headaches.  Psychiatric/Behavioral: Negative for memory loss (.dbmfu), substance abuse and suicidal ideas. The patient is not nervous/anxious.       Objective:  Physical Exam   Vitals:   04/10/17 1034  BP: 118/70  Pulse: 73  SpO2: 92%  Weight: 128 lb 6.4 oz (58.2 kg)    Height: 5\' 8"  (1.727 m)    Gen: chronically ill appearing, no acute distress HENT: NCAT, OP clear, neck supple without masses Eyes: PERRL, EOMi Lymph: no cervical lymphadenopathy PULM: Few crackles left base B CV: RRR, no mgr, no JVD GI: BS+, soft, nontender, no hsm Derm: no rash or skin breakdown MSK: normal bulk and tone Neuro: A&Ox4, CN II-XII intact, strength 5/5 in all  4 extremities Psyche: normal mood and affect   CBC    Component Value Date/Time   WBC 6.3 12/04/2014 0509   RBC 3.07 (L) 12/04/2014 0509   HGB 9.6 (L) 12/05/2014 0509   HCT 29.9 (L) 12/05/2014 0509   PLT 222 12/04/2014 0509   MCV 95.1 12/04/2014 0509   MCH 30.6 12/04/2014 0509   MCHC 32.2 12/04/2014 0509   RDW 14.9 12/04/2014 0509   LYMPHSABS 1.0 12/03/2014 1215   MONOABS 0.7 12/03/2014 1215   EOSABS 0.2 12/03/2014 1215   BASOSABS 0.0 12/03/2014 1215     Chest imaging: November 2018 CT chest images independently reviewed: Severe emphysema in the upper lobe, left greater than right, likely honeycombing versus severe paraseptal emphysema in a peripheral distribution worse in the bases, there is sparing with  relatively large patches of lung which is normal-appearing but fibrotic changes in the periphery.  There is no clear traction bronchiectasis  PFT:  Labs:  Path:  Echo: 2013 echocardiogram showed a normal LVEF  Heart Catheterization: May 2016 left heart catheterization showed single-vessel occlusive coronary disease, patent saphenous vein graft to PDA, patent stents in the mid LAD, good LV function.  Records reviewed from his October 2017 cardiology visit where he was seen for stable coronary artery disease, AAA.  No changes were made to therapy at that point.      Assessment & Plan:   ILD (interstitial lung disease) ([place]) - Plan: Pulmonary Function Test  Centrilobular emphysema [facility])  Discussion: Mr. Mccamish has been referred for evaluation of likely interstitial lung disease.  He  smoked 1 pack of cigarettes daily for 50 years and quit about 15 years ago.  He has minimal symptoms right now.  On my review of his CT scan he definitely has severe emphysema in the upper lobes with a lot of paraseptal emphysema in the lower lobes.  However there is a lot of lung which is well preserved.  The findings are worrisome for pulmonary fibrosis and given the relative sparing of lung tissue and his demographic I would be most concerned about idiopathic pulmonary fibrosis.  However, the CT scan is not completely consistent with UIP.  I think the best approach moving forward is to get a lung function test to see if there is progression compared to the 2015 study.  If there is evidence of that then on the next visit we will need to get lab work to look for evidence of an underlying connective tissue disease and then we can consider whether or not we should treat him with an anti-fibrotic medicine.  Right now he is not using the bronchodilator prescribed by his primary care physician.  I think this would be a good idea to go ahead and start using so I encouraged him to do this at length today.  Plan: Emphysema: Lung function test Take Anoro, sample given to you by Dr. Brigitte Pulse, 1 puff daily no matter how you feel  Likely interstitial lung disease, pulmonary fibrosis: We will get a lung function test and compare it to the results in 2015 If there is evidence of progression then on the next visit we will get lab work to see if there is evidence of an underlying connective tissue disease which is causing this problem  We will see you back in 1-2 weeks, nurse practitioner visit okay     Current Outpatient Medications:  .  acetaminophen (TYLENOL) 325 MG tablet, Take 2 tablets (650 mg total) by mouth every 4 (four)  hours as needed for headache or mild pain., Disp: , Rfl:  .  aspirin 81 MG tablet, Take 81 mg by mouth daily., Disp: , Rfl:  .  azithromycin (ZITHROMAX) 250 MG tablet, Take 1 tablet (250  mg total) by mouth daily. Take first 2 tablets together, then 1 every day until finished., Disp: 6 tablet, Rfl: 0 .  ferrous sulfate 325 (65 FE) MG tablet, Take 1 tablet (325 mg total) by mouth 2 (two) times daily with a meal., Disp: 60 tablet, Rfl: 0 .  levothyroxine (SYNTHROID, LEVOTHROID) 50 MCG tablet, Take 50 mcg by mouth daily., Disp: , Rfl:  .  Multiple Vitamin (MULTIVITAMIN WITH MINERALS) TABS, Take 1 tablet by mouth daily., Disp: , Rfl:  .  NITROSTAT 0.4 MG SL tablet, PLACE 1 TABLET (0.4 MG TOTAL) UNDER THE TONGUE EVERY 5 (FIVE) MINUTES AS NEEDED. FOR CHEST PAIN, Disp: 25 tablet, Rfl: 2 .  pantoprazole (PROTONIX) 40 MG tablet, Take 40 mg by mouth daily., Disp: , Rfl:  .  rosuvastatin (CRESTOR) 10 MG tablet, Take 1 tablet (10 mg total) daily by mouth. Please make overdue yearly appt with Dr. Marlou Porch before anymore refills. 2nd attempt, Disp: 15 tablet, Rfl: 0

## 2017-04-10 NOTE — Progress Notes (Signed)
   Subjective:    Patient ID: Timothy Millin Sr., male    DOB: December 06, 1933, 83 y.o.   MRN: [id number]  HPI    Review of Systems  Respiratory: Positive for cough, shortness of breath, wheezing and stridor.        Objective:   Physical Exam        Assessment & Plan:

## 2017-04-10 NOTE — Patient Instructions (Signed)
Emphysema: Lung function test Take Anoro, sample given to you by Dr. Brigitte Pulse, 1 puff daily no matter how you feel  Likely interstitial lung disease, pulmonary fibrosis: We will get a lung function test and compare it to the results in 2015 If there is evidence of progression then on the next visit we will get lab work to see if there is evidence of an underlying connective tissue disease which is causing this problem  We will see you back in 1-2 weeks, nurse practitioner visit okay

## 2017-05-02 ENCOUNTER — Other Ambulatory Visit: Payer: Self-pay

## 2017-05-02 ENCOUNTER — Telehealth: Payer: Self-pay | Admitting: Cardiology

## 2017-05-02 MED ORDER — ROSUVASTATIN CALCIUM 10 MG PO TABS: 10.0000 mg | ORAL_TABLET | Freq: Every day | ORAL | 0 refills | Status: DC

## 2017-05-02 NOTE — Telephone Encounter (Signed)
New message     *STAT* If patient is at the pharmacy, call can be transferred to refill team.   1. Which medications need to be refilled? (please list name of each medication and dose if known) rosuvastatin (CRESTOR) 10 MG tablet  2. Which pharmacy/location (including street and city if local pharmacy) is medication to be sent to? [facility] [address]  3. Do they need a 30 day or 90 day supply? [place]

## 2017-05-07 ENCOUNTER — Ambulatory Visit (INDEPENDENT_AMBULATORY_CARE_PROVIDER_SITE_OTHER): Payer: Medicare Other | Admitting: Acute Care

## 2017-05-07 ENCOUNTER — Other Ambulatory Visit (INDEPENDENT_AMBULATORY_CARE_PROVIDER_SITE_OTHER): Payer: Medicare Other

## 2017-05-07 ENCOUNTER — Ambulatory Visit (INDEPENDENT_AMBULATORY_CARE_PROVIDER_SITE_OTHER): Payer: Medicare Other | Admitting: Pulmonary Disease

## 2017-05-07 ENCOUNTER — Encounter: Payer: Self-pay | Admitting: Acute Care

## 2017-05-07 VITALS — BP 142/62 | HR 72 | Ht 65.0 in | Wt 129.0 lb

## 2017-05-07 DIAGNOSIS — R0602 Shortness of breath: Secondary | ICD-10-CM

## 2017-05-07 DIAGNOSIS — J849 Interstitial pulmonary disease, unspecified: Secondary | ICD-10-CM

## 2017-05-07 DIAGNOSIS — R911 Solitary pulmonary nodule: Secondary | ICD-10-CM | POA: Diagnosis not present

## 2017-05-07 LAB — SEDIMENTATION RATE: SED RATE: 31 mm/h — AB (ref 0–20)

## 2017-05-07 LAB — PULMONARY FUNCTION TEST
DL/VA % PRED: 55 %
DL/VA: 2.37 ml/min/mmHg/L
DLCO UNC: 8.07 ml/min/mmHg
DLCO cor % pred: 34 %
DLCO cor: 8.79 ml/min/mmHg
DLCO unc % pred: 31 %
FEF 25-75 POST: 1.85 L/s
FEF 25-75 Pre: 1.28 L/sec
FEF2575-%Change-Post: 43 %
FEF2575-%PRED-POST: 128 %
FEF2575-%PRED-PRE: 89 %
FEV1-%CHANGE-POST: 9 %
FEV1-%PRED-POST: 96 %
FEV1-%Pred-Pre: 87 %
FEV1-PRE: 1.92 L
FEV1-Post: 2.1 L
FEV1FVC-%Change-Post: 5 %
FEV1FVC-%PRED-PRE: 101 %
FEV6-%Change-Post: 3 %
FEV6-%PRED-POST: 94 %
FEV6-%Pred-Pre: 92 %
FEV6-Post: 2.74 L
FEV6-Pre: 2.66 L
FEV6FVC-%CHANGE-POST: 0 %
FEV6FVC-%Pred-Post: 107 %
FEV6FVC-%Pred-Pre: 108 %
FVC-%CHANGE-POST: 3 %
FVC-%PRED-POST: 87 %
FVC-%PRED-PRE: 84 %
FVC-PRE: 2.66 L
FVC-Post: 2.76 L
POST FEV1/FVC RATIO: 76 %
PRE FEV1/FVC RATIO: 72 %
PRE FEV6/FVC RATIO: 100 %
Post FEV6/FVC ratio: 99 %

## 2017-05-07 LAB — C-REACTIVE PROTEIN: CRP: 0.4 mg/dL — ABNORMAL LOW (ref 0.5–20.0)

## 2017-05-07 MED ORDER — MOMETASONE FURO-FORMOTEROL FUM 100-5 MCG/ACT IN AERO: 2.0000 | INHALATION_SPRAY | Freq: Two times a day (BID) | RESPIRATORY_TRACT | 0 refills | Status: DC

## 2017-05-07 NOTE — Patient Instructions (Addendum)
It is nice to meet you today. We will collect labs to determine if there is an underlying auto immune cause to your lung disease. We will call you with results. Follow up CT chest in 5 /2019 nodule follow up. ( From Nov. 2018 scan) Stop Anoro Start Dulera 100  2 puffs twice daily every day without fail. Follow up with Dr. Lake Bells or NP  in 2 weeks. Please contact office for sooner follow up if symptoms do not improve or worsen or seek emergency care

## 2017-05-07 NOTE — Progress Notes (Signed)
History of Present Illness Timothy VIVERETTE Sr. 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 He is a patient of Dr. Lake Bells.   05/07/2017  Presents for follow up after PFT's to evaluate for progression of ILD. He states he continues to be  short of breath with exertion. He has not been using the Anoro prescribed for him due to the fact it makes him dizzy. He states he has also tried Breo in the past but that also " Made him sick" . He denies fever, chest pain, orthopnea or hemoptysis  Test Results: CT Chest 03/2017 Severe changes of emphysema primarily centrilobular within the upper lobes to the apices. Diffuse changes of usual interstitial pneumonia or idiopathic pulmonary fibrosis. No definite active process. Significant thoracic aortic atherosclerosis and diffuse coronary artery calcifications. Small midline abdominal wall hernia within the upper abdomen containing only fat. Partial compression deformity of T10 not present previously.  CXR 02/2017 Diffuse bilateral chronic interstitial changes consistent interstitial fibrosis. Findings most prominent a right upper lobe. Similar on prior exam. No acute abnormality identified .   CBC Latest Ref Rng & Units 12/05/2014 12/04/2014 12/04/2014  WBC 4.0 - 10.5 K/uL - - -  Hemoglobin 13.0 - 17.0 g/dL 9.6(L) 10.6(L) 9.9(L)  Hematocrit 39.0 - 52.0 % 29.9(L) 32.7(L) 30.4(L)  Platelets 150 - 400 K/uL - - -    BMP Latest Ref Rng & Units 12/04/2014 12/03/2014 11/27/2014  Glucose 65 - 99 mg/dL 89 94 90  BUN 6 - 20 mg/dL 11 15 6   Creatinine 0.61 - 1.24 mg/dL 0.76 0.87 0.86  Sodium 135 - 145 mmol/L 138 140 142  Potassium 3.5 - 5.1 mmol/L 3.6 4.6 3.6  Chloride 101 - 111 mmol/L 110 109 109  CO2 22 - 32 mmol/L 23 25 23   Calcium 8.9 - 10.3 mg/dL 8.1(L) 8.7(L) 8.3(L)    BNP    Component Value Date/Time   BNP 141.8 (H) 09/09/2014 2022    ProBNP    Component Value Date/Time   PROBNP 107.0 (H) 11/14/2013 1144     PFT    Component Value Date/Time   FEV1PRE 1.92 05/07/2017 0843   FEV1POST 2.10 05/07/2017 0843   FVCPRE 2.66 05/07/2017 0843   FVCPOST 2.76 05/07/2017 0843   TLC 4.44 10/17/2013 1209   DLCOUNC 8.07 05/07/2017 0843   PREFEV1FVCRT 72 05/07/2017 0843   PSTFEV1FVCRT 76 05/07/2017 0843    No results found.   Past medical hx Past Medical History:  Diagnosis Date  . Anemia   . Anginal pain ([place])   . Complication of anesthesia 1987   "didn't wake up for 5 days"  . COPD (chronic obstructive pulmonary disease) ([place])   . Coronary artery disease   . Exertional dyspnea 01/04/2012  . GI bleed due to NSAIDs 02/2012  . High cholesterol   . History of blood transfusion 1987; 11/25/2014   "emergent CABG; anemia"  . Hyperlipidemia   . Hypothyroidism   . Myocardial infarction ([place]) 1987  . Numbness and tingling in hands 01/04/2012   "right one goes to sleep on steering wheel when I'm driving; been that way long time; @ least 1 yr"  . Thyroid disease      Social History   Tobacco Use  . Smoking status: Former Smoker    Packs/day: 1.00    Years: 40.00    Pack years: 40.00    Types: Cigarettes    Last attempt to quit: 05/08/1988    Years  since quitting: 29.0  . Smokeless tobacco: Never Used  Substance Use Topics  . Alcohol use: No    Comment: 11/25/2014 "hadn't drank any alcohol in the 2000's"  . Drug use: No    [name] reports that he quit smoking about 29 years ago. His smoking use included cigarettes. He has a 40.00 pack-year smoking history. he has never used smokeless tobacco. He reports that he does not drink alcohol or use drugs.  Tobacco Cessation: Former smoker quit 1990  Past surgical hx, Family hx, Social hx all reviewed.  Current Outpatient Medications on File Prior to Visit  Medication Sig  . acetaminophen (TYLENOL) 325 MG tablet Take 2 tablets (650 mg total) by mouth every 4 (four) hours as needed for headache or mild pain.  Marland Kitchen aspirin 81 MG tablet Take 81 mg  by mouth daily.  Marland Kitchen azithromycin (ZITHROMAX) 250 MG tablet Take 1 tablet (250 mg total) by mouth daily. Take first 2 tablets together, then 1 every day until finished.  . ferrous sulfate 325 (65 FE) MG tablet Take 1 tablet (325 mg total) by mouth 2 (two) times daily with a meal.  . levothyroxine (SYNTHROID, LEVOTHROID) 50 MCG tablet Take 50 mcg by mouth daily.  . Multiple Vitamin (MULTIVITAMIN WITH MINERALS) TABS Take 1 tablet by mouth daily.  Marland Kitchen NITROSTAT 0.4 MG SL tablet PLACE 1 TABLET (0.4 MG TOTAL) UNDER THE TONGUE EVERY 5 (FIVE) MINUTES AS NEEDED. FOR CHEST PAIN  . pantoprazole (PROTONIX) 40 MG tablet Take 40 mg by mouth daily.  . rosuvastatin (CRESTOR) 10 MG tablet Take 1 tablet (10 mg total) by mouth daily. Please keep upcoming appt for more refills   No current facility-administered medications on file prior to visit.      Allergies  Allergen Reactions  . Ibuprofen Other (See Comments)    Internal bleeding  . Morphine And Related Nausea And Vomiting  . Atorvastatin     Muscle pain    Review Of Systems:  Constitutional:   No  weight loss, night sweats,  Fevers, chills, fatigue, or  lassitude.  HEENT:   No headaches,  Difficulty swallowing,  Tooth/dental problems, or  Sore throat,                No sneezing, itching, ear ache, nasal congestion, post nasal drip,   CV:  No chest pain,  Orthopnea, PND, swelling in lower extremities, anasarca, dizziness, palpitations, syncope.   GI  No heartburn, indigestion, abdominal pain, nausea, vomiting, diarrhea, change in bowel habits, loss of appetite, bloody stools.   Resp: + shortness of breath with exertion less at rest.  No excess mucus, no productive cough,  No non-productive cough,  No coughing up of blood.  No change in color of mucus.  No wheezing.  No chest wall deformity  Skin: no rash or lesions.  GU: no dysuria, change in color of urine, no urgency or frequency.  No flank pain, no hematuria   MS:  No joint pain or swelling.   No decreased range of motion.  No back pain.  Psych:  No change in mood or affect. No depression or anxiety.  No memory loss.   Vital Signs BP (!) 142/62 (BP Location: Left Arm, Cuff Size: Normal)   Pulse 72   Ht 5\' 5"  (1.651 m)   Wt 129 lb (58.5 kg)   SpO2 97%   BMI 21.47 kg/m    Physical Exam:  General- No distress,  A&Ox3, pleasant, HOH, ? dementia ENT: No  sinus tenderness, TM clear, pale nasal mucosa, no oral exudate,no post nasal drip, no LAN Cardiac: S1, S2, regular rate and rhythm, no murmur Chest: No wheeze/ rales/ dullness; no accessory muscle use, no nasal flaring, no sternal retractions, diminished per bases with crackles noted Abd.: Soft Non-tender, non-distended Ext: No clubbing cyanosis, edema Neuro:  normal strength Skin: No rashes, warm and dry Psych: normal mood and behavior   Assessment/Plan  ILD (interstitial lung disease) ([facility]) PFT's show progression of disease compared to 2015 Plan: We will collect labs to determine if there is an underlying auto immune cause to your lung disease. We will call you with results. Follow up CT chest in 5 /2019 nodule follow up. ( From Nov. 2018 scan) Stop Anoro ( makes you dizzy) Start Dulera 100  2 puffs twice daily every day without fail. Follow up with Dr. Lake Bells or NP  in 2 weeks. Please contact office for sooner follow up if symptoms do not improve or worsen or seek emergency care      Magdalen Spatz, NP 05/07/2017  3:15 PM

## 2017-05-07 NOTE — Assessment & Plan Note (Signed)
PFT's show progression of disease compared to 2015 Plan: We will collect labs to determine if there is an underlying auto immune cause to your lung disease. We will call you with results. Follow up CT chest in 5 /2019 nodule follow up. ( From Nov. 2018 scan) Stop Anoro ( makes you dizzy) Start Dulera 100  2 puffs twice daily every day without fail. Follow up with Dr. Lake Bells or NP  in 2 weeks. Please contact office for sooner follow up if symptoms do not improve or worsen or seek emergency care

## 2017-05-07 NOTE — Progress Notes (Signed)
PFT done today. 

## 2017-05-09 LAB — ANTI-JO 1 ANTIBODY, IGG: Anti JO-1: <0.2 AI (ref 0.0–0.9)

## 2017-05-09 LAB — CENTROMERE ANTIBODIES: Centromere Ab Screen: <0.2 AI (ref 0.0–0.9)

## 2017-05-14 LAB — HYPERSENSITIVITY PNUEMONITIS PROFILE
ASPERGILLUS FUMIGATUS: NEGATIVE
FAENIA RETIVIRGULA: NEGATIVE
Pigeon Serum: NEGATIVE
S. VIRIDIS: NEGATIVE
T. CANDIDUS: NEGATIVE
T. VULGARIS: NEGATIVE

## 2017-05-14 LAB — ALDOLASE: ALDOLASE: 2.9 U/L (ref <=8.1)

## 2017-05-14 LAB — ANTI-SCLERODERMA ANTIBODY: Scleroderma (Scl-70) (ENA) Antibody, IgG: <1 AI

## 2017-05-14 LAB — ANA: Anti Nuclear Antibody(ANA): NEGATIVE

## 2017-05-14 LAB — SJOGRENS SYNDROME-B EXTRACTABLE NUCLEAR ANTIBODY: SSB (La) (ENA) Antibody, IgG: <1 AI

## 2017-05-14 LAB — RHEUMATOID FACTOR: Rhuematoid fact SerPl-aCnc: <14 IU/mL (ref <14)

## 2017-05-14 LAB — SJOGRENS SYNDROME-A EXTRACTABLE NUCLEAR ANTIBODY: SSA (Ro) (ENA) Antibody, IgG: <1 AI

## 2017-05-17 ENCOUNTER — Telehealth: Payer: Self-pay | Admitting: Pulmonary Disease

## 2017-05-17 NOTE — Telephone Encounter (Signed)
Worse than prior study If not already ordered then please order my ILD serology panel

## 2017-05-17 NOTE — Telephone Encounter (Signed)
Called and spoke with pt's spouse, Everlene Farrier. Everlene Farrier is requesting pt's PFT results from 05/07/17.  BQ please advise. Thanks

## 2017-05-17 NOTE — Telephone Encounter (Signed)
Pt's spouse is aware of results and voiced her understanding. It appears that pt had ILD panel on 05/07/17 ordered by SG.  BQ please advise. Thanks.

## 2017-05-17 NOTE — Telephone Encounter (Signed)
Will close message.

## 2017-05-17 NOTE — Telephone Encounter (Signed)
No need to order

## 2017-05-22 ENCOUNTER — Telehealth: Payer: Self-pay | Admitting: Pulmonary Disease

## 2017-05-22 NOTE — Telephone Encounter (Signed)
Timothy Doom, MD  to [facility] Triage Pool    05/17/17 4:51 PM  Note    No need to order     Spoke with daughter in law and advised her that BQ did not want anymore blood work and that he will discuss in detail at the next office visit. She verbalized understanding and will let the pt know. Nothing furthur is needed.

## 2017-06-07 ENCOUNTER — Ambulatory Visit (INDEPENDENT_AMBULATORY_CARE_PROVIDER_SITE_OTHER): Payer: Medicare Other | Admitting: Pulmonary Disease

## 2017-06-07 ENCOUNTER — Ambulatory Visit: Payer: Medicare Other | Admitting: Pulmonary Disease

## 2017-06-07 ENCOUNTER — Encounter: Payer: Self-pay | Admitting: Cardiology

## 2017-06-07 VITALS — BP 122/64 | HR 60 | Ht 67.0 in | Wt 129.4 lb

## 2017-06-07 DIAGNOSIS — J84112 Idiopathic pulmonary fibrosis: Secondary | ICD-10-CM

## 2017-06-07 DIAGNOSIS — J432 Centrilobular emphysema: Secondary | ICD-10-CM

## 2017-06-07 NOTE — Progress Notes (Signed)
Subjective:   PATIENT ID: [name]. GENDER: male DOB: 04/03/1934, MRN: [id number]   Synopsis: Centrilobular emphysema and IPF. He smoked 1 ppd for 50 years   HPI  Chief Complaint  Patient presents with  . Follow-up    shortness of breath, coughing   [name] is here today to review the results from his lung function testing blood work and chest CT.  He says that he continues to have some shortness of breath.  He says the [facility] made no difference in his breathing whatsoever.  He has a cough from time to time.  He denies chest pain though he says that he has a bilateral anterior subcostal pain.  He denies any sort of nausea or vomiting.  He says that he is not having significant chest tightness lately.  Past Medical History:  Diagnosis Date  . Anemia   . Anginal pain ([place])   . Complication of anesthesia 1987   "didn't wake up for 5 days"  . COPD (chronic obstructive pulmonary disease) ([place])   . Coronary artery disease   . Exertional dyspnea 01/04/2012  . GI bleed due to NSAIDs 02/2012  . High cholesterol   . History of blood transfusion 1987; 11/25/2014   "emergent CABG; anemia"  . Hyperlipidemia   . Hypothyroidism   . Myocardial infarction ([place]) 1987  . Numbness and tingling in hands 01/04/2012   "right one goes to sleep on steering wheel when I'm driving; been that way long time; @ least 1 yr"  . Thyroid disease        Review of Systems  Constitutional: Positive for malaise/fatigue. Negative for chills, fever and weight loss.  HENT: Negative for congestion, nosebleeds, sinus pain and sore throat.   Eyes: Negative for photophobia, pain and discharge.  Respiratory: Positive for shortness of breath. Negative for cough, hemoptysis, sputum production and wheezing.   Cardiovascular: Negative for chest pain, palpitations, orthopnea and leg swelling.  Gastrointestinal: Positive for abdominal pain. Negative for constipation, diarrhea, nausea and vomiting.  Genitourinary:  Negative for dysuria, frequency, hematuria and urgency.  Musculoskeletal: Negative for back pain, joint pain, myalgias and neck pain.  Skin: Negative for itching and rash.  Neurological: Negative for tingling, tremors, sensory change, speech change, focal weakness, seizures, weakness and headaches.  Psychiatric/Behavioral: Negative for memory loss (.dbmfu), substance abuse and suicidal ideas. The patient is not nervous/anxious.       Objective:  Physical Exam   Vitals:   06/07/17 1428  BP: 122/64  Pulse: 60  SpO2: 94%  Weight: 129 lb 6.4 oz (58.7 kg)  Height: 5\' 7"  (1.702 m)   Gen: chronically ill appearing HENT: OP clear, TM's clear, neck supple PULM: Poor air movement, normal percussion CV: RRR, no mgr, trace edema GI: BS+, soft, nontender Derm: no cyanosis or rash Psyche: normal mood and affect   CBC    Component Value Date/Time   WBC 6.3 12/04/2014 0509   RBC 3.07 (L) 12/04/2014 0509   HGB 9.6 (L) 12/05/2014 0509   HCT 29.9 (L) 12/05/2014 0509   PLT 222 12/04/2014 0509   MCV 95.1 12/04/2014 0509   MCH 30.6 12/04/2014 0509   MCHC 32.2 12/04/2014 0509   RDW 14.9 12/04/2014 0509   LYMPHSABS 1.0 12/03/2014 1215   MONOABS 0.7 12/03/2014 1215   EOSABS 0.2 12/03/2014 1215   BASOSABS 0.0 12/03/2014 1215     Chest imaging: November 2018 CT chest images independently reviewed: Severe emphysema in the upper lobe, left greater  than right, likely honeycombing versus severe paraseptal emphysema in a peripheral distribution worse in the bases, there is sparing with  relatively large patches of lung which is normal-appearing but fibrotic changes in the periphery.  There is no clear traction bronchiectasis  PFT: December 2018 Ratio 76%, FEV1 2.10 L 96% predicted, FVC 2.76 L 87% predicted, total lung capacity 3.82 L 63% predicted, DLCO 8.1 mL 31% predicted  Labs: December 2018 aldolase negative, CRP low, sed rate slightly elevated, hypersensitivity pneumonitis panel  negative, ANA negative, anti-Jo 1 negative, anticentromere negative, rheumatoid factor negative, SSA/SSB negative, SCL 70 negative  Path:  Echo: 2013 echocardiogram showed a normal LVEF  Heart Catheterization: May 2016 left heart catheterization showed single-vessel occlusive coronary disease, patent saphenous vein graft to PDA, patent stents in the mid LAD, good LV function.       Assessment & Plan:   IPF (idiopathic pulmonary fibrosis) ([place]) - Plan: 6 minute walk  Centrilobular emphysema (HCC)  Discussion: Mr. Puccinelli has idiopathic pulmonary fibrosis.  He has had progressive worsening of lung function testing associated with worsening symptoms over the last 3 years.  Objectively he has a fibrotic process which is patchy and associated with basilar predominant honeycombing.  On top of all this he has very severe emphysema.  He has no evidence of an underlying connective tissue disease.  I have no suspicion of aspiration based on his clinical history.  I explained to him and his family today in no uncertain terms that this condition will progress and will likely kill him.  I explained that anti-fibrotic therapy is his best approach at getting more time aboveground.  He adamantly refuses any medicine.  He says he really does not want to take any more medicine at any cost.  His family and I tried to persuade him to take Ofev or Esbriet but he would have nothing of it.  He is also quite upset about some of the epigastric pain that he has had for some time.  Plan: Idiopathic pulmonary fibrosis: This is a condition that causes scarring in your lungs which will progress eventually cause you to have more shortness of breath and need oxygen We cannot cure this illness but we can use medicines to help slow its progression.  The 2 medicines are called Ofev and Esbriet Please review the literature we gave you regarding idiopathic pulmonary fibrosis and let me know if you have reconsidered whether  or not you should take a medicine to slow the progression of the disease We will check your walking oxygen saturation today, and we will repeat that test with something called a 6-minute walk test on the next visit  Centrilobular emphysema: This means that your lung is "moth-eaten" or another words has large holes in it from prior tobacco use From my perspective if you had no benefit from the [facility] you do not need to take this again You can use albuterol as needed for chest tightness wheezing or shortness of breath, call us if you need a refill on this medicine  We will see you back in 2 months or sooner if needed  > 50% of this 40 minute visit spent face to face   Current Outpatient Medications:  .  acetaminophen (TYLENOL) 325 MG tablet, Take 2 tablets (650 mg total) by mouth every 4 (four) hours as needed for headache or mild pain., Disp: , Rfl:  .  aspirin 81 MG tablet, Take 81 mg by mouth daily., Disp: , Rfl:  .  ferrous sulfate 325 (65 FE) MG tablet, Take 1 tablet (325 mg total) by mouth 2 (two) times daily with a meal., Disp: 60 tablet, Rfl: 0 .  levothyroxine (SYNTHROID, LEVOTHROID) 50 MCG tablet, Take 50 mcg by mouth daily., Disp: , Rfl:  .  mometasone-formoterol (DULERA) 100-5 MCG/ACT AERO, Inhale 2 puffs into the lungs 2 (two) times daily., Disp: 1 Inhaler, Rfl: 0 .  Multiple Vitamin (MULTIVITAMIN WITH MINERALS) TABS, Take 1 tablet by mouth daily., Disp: , Rfl:  .  NITROSTAT 0.4 MG SL tablet, PLACE 1 TABLET (0.4 MG TOTAL) UNDER THE TONGUE EVERY 5 (FIVE) MINUTES AS NEEDED. FOR CHEST PAIN, Disp: 25 tablet, Rfl: 2 .  pantoprazole (PROTONIX) 40 MG tablet, Take 40 mg by mouth daily., Disp: , Rfl:  .  rosuvastatin (CRESTOR) 10 MG tablet, Take 1 tablet (10 mg total) by mouth daily. Please keep upcoming appt for more refills, Disp: 60 tablet, Rfl: 0

## 2017-06-07 NOTE — Patient Instructions (Signed)
Idiopathic pulmonary fibrosis: This is a condition that causes scarring in your lungs which will progress eventually cause you to have more shortness of breath and need oxygen We cannot cure this illness but we can use medicines to help slow its progression.  The 2 medicines are called Ofev and Esbriet Please review the literature we gave you regarding idiopathic pulmonary fibrosis and let me know if you have reconsidered whether or not you should take a medicine to slow the progression of the disease We will check your walking oxygen saturation today, and we will repeat that test with something called a 6-minute walk test on the next visit  Centrilobular emphysema: This means that your lung is "moth-eaten" or another words has large holes in it from prior tobacco use From my perspective if you had no benefit from the [facility] you do not need to take this again You can use albuterol as needed for chest tightness wheezing or shortness of breath, call us if you need a refill on this medicine  We will see you back in 2 months or sooner if needed

## 2017-06-08 ENCOUNTER — Encounter: Payer: Self-pay | Admitting: Cardiology

## 2017-06-08 ENCOUNTER — Ambulatory Visit: Payer: Medicare Other | Admitting: Cardiology

## 2017-06-08 VITALS — BP 112/58 | HR 63 | Ht 67.5 in | Wt 127.8 lb

## 2017-06-08 DIAGNOSIS — R42 Dizziness and giddiness: Secondary | ICD-10-CM

## 2017-06-08 DIAGNOSIS — I951 Orthostatic hypotension: Secondary | ICD-10-CM | POA: Diagnosis not present

## 2017-06-08 DIAGNOSIS — I2510 Atherosclerotic heart disease of native coronary artery without angina pectoris: Secondary | ICD-10-CM | POA: Diagnosis not present

## 2017-06-08 NOTE — Patient Instructions (Signed)

## 2017-06-08 NOTE — Progress Notes (Signed)
[facility]. [address], [place]  [address] Phone: [phone] Fax:  [phone]  Date:  06/08/2017   ID:  Timothy Millin Sr., DOB 03-02-1934, MRN [id number]  PCP:  Mayra Neer, MD   History of Present Illness: Timothy PIZZO Sr. is a 84 y.o. male with coronary artery disease status post stent, drug eluting, to LAD (01/05/12)   Prior GI bleed Upper GI/endoscopy demonstrated small AVM and subsequent hospitalization with repeat GI bleed showed cecal ulcer , clipping took place. 3 units of blood. Hospitalization was 12/05/14. Now back on aspirin.   Cardiac catheterization on 09/10/14:  Prox RCA lesion, 100% stenosed.  Dist Graft lesion, 25% stenosed.  RPDA lesion, 40% stenosed.  LM lesion, 35% stenosed.  Ost LAD to Prox LAD lesion, 35% stenosed.  Prox Cx to Mid Cx lesion, 10% stenosed.  1. Single vessel occlusive CAD 2. Patent SVG to PDA. 3. Patent stents in the mid LAD 4. Good LV function.  Recommendations: continue medical management.    lower GI bleed was analyzed with tagged RBC scan showing bleeding in the terminal ileum , 1 unit of PRBC. A cecal ulcer was confirmed , clipped.  06/08/17-multiple pulmonary visits recently for his pulmonary fibrosis/COPD.  Notes reviewed shortness of breath continues.  He understands that his body is deteriorating.  He is careful with dizziness when standing up.  No syncope.  No chest pain.     Wt Readings from Last 3 Encounters:  06/08/17 127 lb 12.8 oz (58 kg)  06/07/17 129 lb 6.4 oz (58.7 kg)  05/07/17 129 lb (58.5 kg)     Past Medical History:  Diagnosis Date  . Anemia   . Anginal pain ([place])   . Complication of anesthesia 1987   "didn't wake up for 5 days"  . COPD (chronic obstructive pulmonary disease) ([place])   . Coronary artery disease   . Exertional dyspnea 01/04/2012  . GI bleed due to NSAIDs 02/2012  . High cholesterol   . History of blood transfusion 1987; 11/25/2014   "emergent CABG; anemia"  .  Hyperlipidemia   . Hypothyroidism   . Myocardial infarction ([place]) 1987  . Numbness and tingling in hands 01/04/2012   "right one goes to sleep on steering wheel when I'm driving; been that way long time; @ least 1 yr"  . Thyroid disease     Past Surgical History:  Procedure Laterality Date  . CARDIAC CATHETERIZATION    . CARDIAC CATHETERIZATION N/A 09/10/2014   Procedure: Left Heart Cath and Cors/Grafts Angiography;  Surgeon: Peter M Martinique, MD;  Location: [facility] INVASIVE CV LAB CUPID;  Service: Cardiovascular;  Laterality: N/A;  . CATARACT EXTRACTION W/ INTRAOCULAR LENS  IMPLANT, BILATERAL Bilateral 2012  . COLONOSCOPY N/A 12/04/2014   Procedure: COLONOSCOPY;  Surgeon: Teena Irani, MD;  Location: [facility] ENDOSCOPY;  Service: Endoscopy;  Laterality: N/A;  . COLONOSCOPY W/ POLYPECTOMY  2011   "had to take it out in 3 pieces; all benign"  . CORONARY ANGIOPLASTY WITH STENT PLACEMENT  ? 1990's   "3"  . CORONARY ANGIOPLASTY WITH STENT PLACEMENT  01/04/2012   "2; total of 5"  . CORONARY ARTERY BYPASS GRAFT  1987   CABG X1  . ESOPHAGOGASTRODUODENOSCOPY  02/07/2012   Procedure: ESOPHAGOGASTRODUODENOSCOPY (EGD);  Surgeon: Wonda Horner, MD;  Location: [facility] ENDOSCOPY;  Service: Endoscopy;  Laterality: N/A;  . ESOPHAGOGASTRODUODENOSCOPY (EGD) WITH PROPOFOL Left 11/26/2014   Procedure: ESOPHAGOGASTRODUODENOSCOPY (EGD) WITH PROPOFOL;  Surgeon: Arta Silence,  MD;  Location: [facility] ENDOSCOPY;  Service: Endoscopy;  Laterality: Left;  . PERCUTANEOUS CORONARY STENT INTERVENTION (PCI-S) N/A 01/04/2012   Procedure: PERCUTANEOUS CORONARY STENT INTERVENTION (PCI-S);  Surgeon: Sinclair Grooms, MD;  Location: [facility] CATH LAB;  Service: Cardiovascular;  Laterality: N/A;  . WRIST FRACTURE SURGERY Left ~ 2009  . WRIST HARDWARE REMOVAL Left ~ 2010   "took steel bar out"    Current Outpatient Medications  Medication Sig Dispense Refill  . acetaminophen (TYLENOL) 325 MG tablet Take 2 tablets (650 mg total) by mouth every 4 (four)  hours as needed for headache or mild pain.    Marland Kitchen aspirin 81 MG tablet Take 81 mg by mouth daily.    . ferrous sulfate 325 (65 FE) MG tablet Take 1 tablet (325 mg total) by mouth 2 (two) times daily with a meal. 60 tablet 0  . levothyroxine (SYNTHROID, LEVOTHROID) 50 MCG tablet Take 50 mcg by mouth daily.    . mometasone-formoterol (DULERA) 100-5 MCG/ACT AERO Inhale 2 puffs into the lungs 2 (two) times daily. 1 Inhaler 0  . Multiple Vitamin (MULTIVITAMIN WITH MINERALS) TABS Take 1 tablet by mouth daily.    Marland Kitchen NITROSTAT 0.4 MG SL tablet PLACE 1 TABLET (0.4 MG TOTAL) UNDER THE TONGUE EVERY 5 (FIVE) MINUTES AS NEEDED. FOR CHEST PAIN 25 tablet 2  . pantoprazole (PROTONIX) 40 MG tablet Take 40 mg by mouth daily.    . rosuvastatin (CRESTOR) 10 MG tablet Take 1 tablet (10 mg total) by mouth daily. Please keep upcoming appt for more refills 60 tablet 0   No current facility-administered medications for this visit.     Allergies:    Allergies  Allergen Reactions  . Ibuprofen Other (See Comments)    Internal bleeding  . Morphine And Related Nausea And Vomiting  . Atorvastatin     Muscle pain    Social History:  The patient  reports that he quit smoking about 29 years ago. His smoking use included cigarettes. He has a 40.00 pack-year smoking history. he has never used smokeless tobacco. He reports that he does not drink alcohol or use drugs.  Security guard.  ROS:  Please see the history of present illness.  No syncope bleeding orthopnea PND.  PHYSICAL EXAM: VS:  BP (!) 112/58 (BP Location: Left Arm, Patient Position: Sitting, Cuff Size: Normal)   Pulse 63   Ht 5' 7.5" (1.715 m)   Wt 127 lb 12.8 oz (58 kg)   BMI 19.72 kg/m  GEN: Thin in no acute distress  HEENT: normal  Neck: no JVD, carotid bruits, or masses Cardiac: RRR; no murmurs, rubs, or gallops,no edema  Respiratory: Velcro-like crackles bilaterally, decreased breath sounds bilaterally GI: soft, nontender, nondistended, + BS MS: no  deformity or atrophy  Skin: warm and dry, no rash Neuro:  Alert and Oriented x 3, Strength and sensation are intact Psych: euthymic mood, full affect   EKG:  EKG today 06/08/17 - sinus rhythm 63 with no other abnormalities personally viewed.  02/28/2016-sinus rhythm, 63 with no other abnormalities. Sinus bradycardia with mild sinus arrhythmia, heart rate 55 beats per minute. No other abnormalities.  Labs: 07/07/13-hemoglobin 11.5-stable, LDL 94  ASSESSMENT AND PLAN:   1. Coronary artery disease-status post bypass, previous percutaneous intervention, DES 2013. LAD.  No longer on dual anti platelet therapy because of recurrent GI bleeds. Aspirin only.  He is not taking Plavix because of recurrent GI bleeds. 2. 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 No AAA. No  typical mesenteric ischemia symptoms. Dr. Donnetta Hutching.  3. Hyperlipidemia-continue with Crestor overall doing well. 4. History of bypass surgery-as above. Aggressive secondary prevention.   Currently stable with no angina. 5. Orthostatic type symptoms-be careful when getting up too quickly.  Continue hydration.  He is on no antihypertensive medications. 6. Pulmonary fibrosis, COPD- Dr. Anastasia Pall notes reviewed.  Challenging situation.  I understand that he would not want to undergo any surgical procedures.  He contemplated the medications that were suggested for pulmonary fibrosis and does not sound terribly interested in proceeding with these.  He understands. 7. Six-month followup  Signed, Candee Furbish, MD [facility]  06/08/2017 10:43 AM

## 2017-06-27 ENCOUNTER — Other Ambulatory Visit: Payer: Self-pay | Admitting: Cardiology

## 2017-08-06 ENCOUNTER — Ambulatory Visit (INDEPENDENT_AMBULATORY_CARE_PROVIDER_SITE_OTHER): Payer: Medicare Other | Admitting: *Deleted

## 2017-08-06 ENCOUNTER — Ambulatory Visit (INDEPENDENT_AMBULATORY_CARE_PROVIDER_SITE_OTHER): Payer: Medicare Other | Admitting: Pulmonary Disease

## 2017-08-06 ENCOUNTER — Encounter: Payer: Self-pay | Admitting: Pulmonary Disease

## 2017-08-06 VITALS — BP 134/72 | HR 59 | Ht 67.5 in | Wt 128.0 lb

## 2017-08-06 DIAGNOSIS — J849 Interstitial pulmonary disease, unspecified: Secondary | ICD-10-CM

## 2017-08-06 DIAGNOSIS — J432 Centrilobular emphysema: Secondary | ICD-10-CM

## 2017-08-06 DIAGNOSIS — J84112 Idiopathic pulmonary fibrosis: Secondary | ICD-10-CM

## 2017-08-06 NOTE — Progress Notes (Signed)
Subjective:   PATIENT ID: [name]. GENDER: male DOB: Jun 21, 1933, MRN: [id number]   Synopsis: Centrilobular emphysema and IPF. He smoked 1 ppd for 50 years   HPI  Chief Complaint  Patient presents with  . Follow-up    review 2mw.  pt c/o sob, chest tightness with exertion.     [name] says that she continues to copmlain of abdominal pain and dizziness. He says this hasn't stopped since the last visit.  He continues to describe bilateral subcostal pain.  He notices chest tightness when he is walking.  He coughs a lot which makes him short of breath.  He does not cough up much mucus.  No pneumonia since the last visit.  He says he feels short of breath pretty much most of the time.  He tells me that he does not want to take any medicines because he Artie takes 8 pills a day and he thinks this is too much.  Past Medical History:  Diagnosis Date  . Anemia   . Anginal pain ([place])   . Complication of anesthesia 1987   "didn't wake up for 5 days"  . COPD (chronic obstructive pulmonary disease) ([place])   . Coronary artery disease   . Exertional dyspnea 01/04/2012  . GI bleed due to NSAIDs 02/2012  . High cholesterol   . History of blood transfusion 1987; 11/25/2014   "emergent CABG; anemia"  . Hyperlipidemia   . Hypothyroidism   . Myocardial infarction ([place]) 1987  . Numbness and tingling in hands 01/04/2012   "right one goes to sleep on steering wheel when I'm driving; been that way long time; @ least 1 yr"  . Thyroid disease        Review of Systems  Constitutional: Positive for malaise/fatigue. Negative for chills, fever and weight loss.  HENT: Negative for congestion, nosebleeds, sinus pain and sore throat.   Eyes: Negative for photophobia, pain and discharge.  Respiratory: Positive for shortness of breath. Negative for cough, hemoptysis, sputum production and wheezing.   Cardiovascular: Negative for chest pain, palpitations, orthopnea and leg swelling.  Gastrointestinal:  Positive for abdominal pain. Negative for constipation, diarrhea, nausea and vomiting.  Genitourinary: Negative for dysuria, frequency, hematuria and urgency.  Musculoskeletal: Negative for back pain, joint pain, myalgias and neck pain.  Skin: Negative for itching and rash.  Neurological: Negative for tingling, tremors, sensory change, speech change, focal weakness, seizures, weakness and headaches.  Psychiatric/Behavioral: Negative for memory loss (.dbmfu), substance abuse and suicidal ideas. The patient is not nervous/anxious.       Objective:  Physical Exam   Vitals:   08/06/17 0917  BP: 134/72  Pulse: (!) 59  SpO2: 97%  Weight: 128 lb (58.1 kg)  Height: 5' 7.5" (1.715 m)    Gen: well appearing HENT: OP clear, TM's clear, neck supple PULM: Poor air movement, few crackles bases B, normal percussion CV: RRR, no mgr, trace edema GI: BS+, soft, nontender Derm: no cyanosis or rash Psyche: normal mood and affect   CBC    Component Value Date/Time   WBC 6.3 12/04/2014 0509   RBC 3.07 (L) 12/04/2014 0509   HGB 9.6 (L) 12/05/2014 0509   HCT 29.9 (L) 12/05/2014 0509   PLT 222 12/04/2014 0509   MCV 95.1 12/04/2014 0509   MCH 30.6 12/04/2014 0509   MCHC 32.2 12/04/2014 0509   RDW 14.9 12/04/2014 0509   LYMPHSABS 1.0 12/03/2014 1215   MONOABS 0.7 12/03/2014 1215   EOSABS 0.2  12/03/2014 1215   BASOSABS 0.0 12/03/2014 1215     Chest imaging: November 2018 CT chest images independently reviewed: Severe emphysema in the upper lobe, left greater than right, likely honeycombing versus severe paraseptal emphysema in a peripheral distribution worse in the bases, there is sparing with  relatively large patches of lung which is normal-appearing but fibrotic changes in the periphery.  There is no clear traction bronchiectasis  PFT: December 2018 Ratio 76%, FEV1 2.10 L 96% predicted, FVC 2.76 L 87% predicted, total lung capacity 3.82 L 63% predicted, DLCO 8.1 mL 31% predicted  6  Min Walk: 08/06/2017 240 m, O2 saturation 93%  Labs: December 2018 aldolase negative, CRP low, sed rate slightly elevated, hypersensitivity pneumonitis panel negative, ANA negative, anti-Jo 1 negative, anticentromere negative, rheumatoid factor negative, SSA/SSB negative, SCL 70 negative  Path:  Echo: 2013 echocardiogram showed a normal LVEF  Heart Catheterization: May 2016 left heart catheterization showed single-vessel occlusive coronary disease, patent saphenous vein graft to PDA, patent stents in the mid LAD, good LV function.  Records from his visit with cardiology reviewed where they continued medical management for coronary artery disease.     Assessment & Plan:   Centrilobular emphysema ([place])  ILD (interstitial lung disease) ([place])  IPF (idiopathic pulmonary fibrosis) (HCC)  Discussion: [name] has centrilobular emphysema, no airflow obstruction on lung function testing, and idiopathic pulmonary fibrosis.  I explained for the second time today in no uncertain terms that this condition will kill him.  We could try to slow this down with anti-fibrotic medicines but his major barrier is the number of pills he takes a day.  Further, he remains very concerned about his abdominal pain much more than complaints of shortness of breath.  In conversation we tried to find strategies for making taking anti-fibrotic medicines more palatable.  Specifically we talked about narrowing his medication list.  I have asked him to talk to his primary care physician about this.  Plan: Idiopathic pulmonary fibrosis: This is the name of the condition that you have that causes scarring in your lungs which will continue to to progress and eventually make you more short of breath and need oxygen. One way to slow this down is to take a medicine called Ofev of twice a day Please let me know if you have reconsidered her decision to take this medicine  Centrilobular emphysema: This is the term that means  that your lung tissue is moth-eaten from prior tobacco use.  I do not think Dulera is helpful.  You do not need to take this medicine.  If you would like to narrow your medications I recommend you talk to your primary care physician about the following: Change oral iron to IV iron Continue low-dose aspirin Continue Synthroid Continue Crestor Continue Protonix Stop all other medicines  If you and your primary care physician feel that you can narrow your list and you would be then willing to take Ofev of please let me know.  I will plan on seeing you back in 3 months or sooner if needed  15 minutes spent with patient, 25 min visit   Current Outpatient Medications:  .  acetaminophen (TYLENOL) 325 MG tablet, Take 2 tablets (650 mg total) by mouth every 4 (four) hours as needed for headache or mild pain., Disp: , Rfl:  .  aspirin 81 MG tablet, Take 81 mg by mouth daily., Disp: , Rfl:  .  ferrous sulfate 325 (65 FE) MG tablet, Take 1 tablet (325  mg total) by mouth 2 (two) times daily with a meal. (Patient taking differently: Take 325 mg by mouth at bedtime. ), Disp: 60 tablet, Rfl: 0 .  levothyroxine (SYNTHROID, LEVOTHROID) 50 MCG tablet, Take 50 mcg by mouth daily., Disp: , Rfl:  .  Multiple Vitamin (MULTIVITAMIN WITH MINERALS) TABS, Take 1 tablet by mouth daily., Disp: , Rfl:  .  NITROSTAT 0.4 MG SL tablet, PLACE 1 TABLET (0.4 MG TOTAL) UNDER THE TONGUE EVERY 5 (FIVE) MINUTES AS NEEDED. FOR CHEST PAIN, Disp: 25 tablet, Rfl: 2 .  pantoprazole (PROTONIX) 40 MG tablet, Take 40 mg by mouth daily., Disp: , Rfl:  .  rosuvastatin (CRESTOR) 10 MG tablet, Take 1 tablet (10 mg total) by mouth daily., Disp: 90 tablet, Rfl: 3

## 2017-08-06 NOTE — Progress Notes (Signed)
SIX MIN WALK 08/06/2017 06/07/2017 12/12/2013 11/14/2013  Medications No meds taken. - - -  Supplimental Oxygen during Test? (L/min) No No No No  Laps 5 - - -  Partial Lap (in Meters) 0 - - -  Baseline BP (sitting) 120/70 - - -  Baseline Heartrate 60 - - -  Baseline Dyspnea (Borg Scale) 2 - - -  Baseline Fatigue (Borg Scale) 2 - - -  Baseline SPO2 96 - - -  BP (sitting) 130/78 - - -  Heartrate 71 - - -  Dyspnea (Borg Scale) 3 - - -  Fatigue (Borg Scale) 3 - - -  SPO2 93 - - -  BP (sitting) 126/72 - - -  Heartrate 67 - - -  SPO2 96 - - -  Stopped or Paused before Six Minutes No - - -  Interpretation Dizziness;Hip pain - - -  Distance Completed 240 - - -  Tech Comments: Pt walked at a slow pace denying any complaints during the walk. ambulated 3 labs at slow pace,feeling tired with some SOB half way on lap 3  normal pace/minimal SOB end of last lap//lmr test stopped due to increased SOB and dizziness//lmr

## 2017-08-06 NOTE — Patient Instructions (Signed)
Idiopathic pulmonary fibrosis: This is the name of the condition that you have that causes scarring in your lungs which will continue to to progress and eventually make you more short of breath and need oxygen. One way to slow this down is to take a medicine called Ofev of twice a day Please let me know if you have reconsidered her decision to take this medicine  Centrilobular emphysema: This is the term that means that your lung tissue is moth-eaten from prior tobacco use.  I do not think Dulera is helpful.  You do not need to take this medicine.  If you would like to narrow your medications I recommend you talk to your primary care physician about the following: Change oral iron to IV iron Continue low-dose aspirin Continue Synthroid Continue Crestor Continue Protonix Stop all other medicines  If you and your primary care physician feel that you can narrow your list and you would be then willing to take Ofev of please let me know.  I will plan on seeing you back in 3 months or sooner if needed

## 2017-10-05 ENCOUNTER — Encounter (INDEPENDENT_AMBULATORY_CARE_PROVIDER_SITE_OTHER): Payer: Self-pay

## 2017-10-05 ENCOUNTER — Telehealth: Payer: Self-pay | Admitting: Acute Care

## 2017-10-05 ENCOUNTER — Ambulatory Visit (INDEPENDENT_AMBULATORY_CARE_PROVIDER_SITE_OTHER)
Admission: RE | Admit: 2017-10-05 | Discharge: 2017-10-05 | Disposition: A | Discharge status: Home / Self Care | Payer: Medicare Other | Source: Ambulatory Visit | Attending: Acute Care | Admitting: Acute Care

## 2017-10-05 DIAGNOSIS — R911 Solitary pulmonary nodule: Secondary | ICD-10-CM | POA: Diagnosis not present

## 2017-10-05 NOTE — Telephone Encounter (Signed)
I have called [name] with the results of his CT scan. I spoke with both he and his wife as he is hard of hearing and I wanted to make sure he understood the results.  I explained that there has been growth noted in the nodule Dr. Lake Bells has been following over  the last year . I told him we would need to do a PET scan to evaluate the area further. He and his wife both verbalized understanding. I told them someone would call to schedule the scan. Jonelle Sidle, please schedule patient with me to go over PET results once the scan is completed. He soes have recent PFT's ( 04/2017), so we do not need to order. Thanks

## 2017-10-08 NOTE — Telephone Encounter (Signed)
[place] x 1 at home number Voicemail full on mobile number

## 2017-10-09 NOTE — Telephone Encounter (Signed)
Pt wife Ortencia Kick is calling back [phone]

## 2017-10-09 NOTE — Telephone Encounter (Signed)
Denise, please call patient back to schedule after the scan is completed.  Thanks so much.

## 2017-10-10 ENCOUNTER — Ambulatory Visit (HOSPITAL_COMMUNITY): Payer: Medicare Other

## 2017-10-10 NOTE — Telephone Encounter (Signed)
Patient wife called - scheduled her a f/u appt with Sarah on 10/24/17, since PET scan is scheduled for 10/17/17. - Patient wife needs nothing further -pr

## 2017-10-17 ENCOUNTER — Ambulatory Visit (HOSPITAL_COMMUNITY)
Admission: RE | Admit: 2017-10-17 | Discharge: 2017-10-17 | Disposition: A | Discharge status: Home / Self Care | Payer: Medicare Other | Source: Ambulatory Visit | Attending: Acute Care | Admitting: Acute Care

## 2017-10-17 DIAGNOSIS — I700 Atherosclerosis of aorta: Secondary | ICD-10-CM | POA: Insufficient documentation

## 2017-10-17 DIAGNOSIS — J849 Interstitial pulmonary disease, unspecified: Secondary | ICD-10-CM | POA: Insufficient documentation

## 2017-10-17 DIAGNOSIS — R911 Solitary pulmonary nodule: Secondary | ICD-10-CM

## 2017-10-17 DIAGNOSIS — J439 Emphysema, unspecified: Secondary | ICD-10-CM | POA: Diagnosis not present

## 2017-10-17 LAB — GLUCOSE, CAPILLARY: GLUCOSE-CAPILLARY: 98 mg/dL (ref 65–99)

## 2017-10-17 MED ORDER — FLUDEOXYGLUCOSE F - 18 (FDG) INJECTION
6.4000 | Freq: Once | INTRAVENOUS | Status: AC
  Administered 2017-10-17: 6.4 via INTRAVENOUS

## 2017-10-24 ENCOUNTER — Ambulatory Visit: Payer: Medicare Other | Admitting: Acute Care

## 2017-10-24 ENCOUNTER — Encounter: Payer: Self-pay | Admitting: Acute Care

## 2017-10-24 VITALS — BP 132/70 | HR 75 | Ht 67.5 in | Wt 123.0 lb

## 2017-10-24 DIAGNOSIS — J449 Chronic obstructive pulmonary disease, unspecified: Secondary | ICD-10-CM

## 2017-10-24 DIAGNOSIS — R911 Solitary pulmonary nodule: Secondary | ICD-10-CM

## 2017-10-24 DIAGNOSIS — J849 Interstitial pulmonary disease, unspecified: Secondary | ICD-10-CM

## 2017-10-24 NOTE — Assessment & Plan Note (Addendum)
Pulmonary nodule that has progressed in size over the last 6 months and is PET avid and suspicious for malignancy Plan We will refer you to Interventional radiology for biopsy of Left lower lobe lung nodule  ( Referral to IR for needle aspiration biopsy) You will get a call from a scheduler to get this set up.. Follow up With Dr. Lake Bells or Judson Roch NP after biopsy for the results. Please contact office for sooner follow up if symptoms do not improve or worsen or seek emergency care .

## 2017-10-24 NOTE — Assessment & Plan Note (Signed)
Currently on no maintenance therapy per patient choice Plan We encourage you to consider initiation of Ofev  This can help slow the progression of your disease. Without this medication this disease will progress as Dr. Lake Bells explained at your last visit.

## 2017-10-24 NOTE — Progress Notes (Signed)
History of Present Illness Timothy WRINKLE Sr. is a 84 y.o. male former smoker (40-pack-year smoking history quit 1990)  with idiopathic pulmonary fibrosis and severe emphysema.He is followed by Dr. Lake Bells.  HPI: [name] has idiopathic pulmonary fibrosis.  He has had progressive worsening of lung function testing associated with worsening symptoms over the last 3 years.  Objectively he has a fibrotic process which is patchy and associated with basilar predominant honeycombing.  On top of all this he has very severe emphysema. There is  no evidence of an underlying connective tissue disease,  no suspicion of aspiration based on his clinical history. He refuses to take  Ofev or Esbriet to slow progression of disease. Complicating factor is a pulmonary nodule in the posterior left lower lobe which is PET avid for SUV of 5.99, suspicious for malignancy.  10/24/2017  Pt. And his wife and daughter present for follow up after PET scan to evaluate a pulmonary nodule that has increased in size over the last 6 months. Pt.  states that he is at his baseline in respect to his breathing.Despite his emphysema, he is not currently on any maintenance inhalers. This is by choice. He does have a rescue inhaler he uses as needed.  Additionally he is chosen not to take maintenance medication for his ILD to prevent progression.  We discussed the results of his PET scan.  I explained that the PET scan is suspicious for malignancy.  I explained that we recomment biopsy. I have discussed this with Dr. Lake Bells.The patient endorses weight loss of about 6 pounds in the last 6 months. He states he has coughed up some blood within the last 2 weeks, but states it is just a small amount. Less than a teaspoon but more than a streak.  He is here with his wife and daughter, both who are encouraging him to have the biopsy to determine if this area is indeed malignant.  The patient is a little hesitant to proceed.  We discussed potential  risks of the procedure, as well as what tissue sampling can offer in regard to definitive diagnosis and to appropriate treatment.  He states he is unsure if the results indicate cancer if he would even pursue treatment.  Patient is currently at his baseline.  He denies fever, chest pain, or orthopnea.  He continues to complain of epigastric pain.  Last time he was here and evaluated Dr. Lake Bells felt this was secondary to hernias.  Test Results: PET Scan 10/17/2017 Moderate FDG uptake associated with the irregular enlarging subpleural nodule within the posterior left lower lobe with an SUV max of 5.99. Suspicious for malignancy. Correlation with tissue sampling advise. Aortic Atherosclerosis (ICD10-I70.0) and Emphysema (ICD10-J43.9). Chronic interstitial lung disease suggestive of usual interstitial pneumonia (UIP).  HRCT 10/05/2017 Interval growth of irregular solid posterior left lower lobe 2.6 cm pulmonary nodule, worrisome for primary bronchogenic carcinoma. PET-CT is indicated for further characterization, as clinically warranted. Spectrum of findings compatible with basilar predominant fibrotic interstitial lung disease with mild-to-moderate honeycombing, mildly worsened since 03/13/2017 chest CT, considered diagnostic of usual interstitial pneumonia (UIP). Severe centrilobular emphysema with diffuse bronchial wall thickening, suggesting COPD  Chest imaging: November 2018 CT chest images independently reviewed: Severe emphysema in the upper lobe, left greater than right, likely honeycombing versus severe paraseptal emphysema in a peripheral distribution worse in the bases, there is sparing with  relatively large patches of lung which is normal-appearing but fibrotic changes in the periphery.  There is no  clear traction bronchiectasis.Nodular opacity which is pleural based in the posterior left lower lobe superior segment may have been present to a much lesser degree previously but followup is  recommended in 6 months to assess stability   PFT: December 2018 Ratio 76%, FEV1 2.10 L 96% predicted, FVC 2.76 L 87% predicted, total lung capacity 3.82 L 63% predicted, DLCO 8.1 mL 31% predicted  6 Min Walk: 08/06/2017 240 m, O2 saturation 93%   Labs: December 2018 aldolase negative, CRP low, sed rate slightly elevated, hypersensitivity pneumonitis panel negative, ANA negative, anti-Jo 1 negative, anticentromere negative, rheumatoid factor negative, SSA/SSB negative, SCL 70 negative  Path:  Echo: 2013 echocardiogram showed a normal LVEF  Heart Catheterization: May 2016 left heart catheterization showed single-vessel occlusive coronary disease, patent saphenous vein graft to PDA, patent stents in the mid LAD, good LV function.   CBC Latest Ref Rng & Units 12/05/2014 12/04/2014 12/04/2014  WBC 4.0 - 10.5 K/uL - - -  Hemoglobin 13.0 - 17.0 g/dL 9.6(L) 10.6(L) 9.9(L)  Hematocrit 39.0 - 52.0 % 29.9(L) 32.7(L) 30.4(L)  Platelets 150 - 400 K/uL - - -    BMP Latest Ref Rng & Units 12/04/2014 12/03/2014 11/27/2014  Glucose 65 - 99 mg/dL 89 94 90  BUN 6 - 20 mg/dL 11 15 6   Creatinine 0.61 - 1.24 mg/dL 0.76 0.87 0.86  Sodium 135 - 145 mmol/L 138 140 142  Potassium 3.5 - 5.1 mmol/L 3.6 4.6 3.6  Chloride 101 - 111 mmol/L 110 109 109  CO2 22 - 32 mmol/L 23 25 23   Calcium 8.9 - 10.3 mg/dL 8.1(L) 8.7(L) 8.3(L)    BNP    Component Value Date/Time   BNP 141.8 (H) 09/09/2014 2022    ProBNP    Component Value Date/Time   PROBNP 107.0 (H) 11/14/2013 1144    PFT    Component Value Date/Time   FEV1PRE 1.92 05/07/2017 0843   FEV1POST 2.10 05/07/2017 0843   FVCPRE 2.66 05/07/2017 0843   FVCPOST 2.76 05/07/2017 0843   TLC 4.44 10/17/2013 1209   DLCOUNC 8.07 05/07/2017 0843   PREFEV1FVCRT 72 05/07/2017 0843   PSTFEV1FVCRT 76 05/07/2017 0843    Ct Chest High Resolution  Result Date: 10/05/2017 CLINICAL DATA:  Follow-up interstitial lung disease. Worsening dyspnea with exertion.  Reported history of pulmonary nodule. EXAM: CT CHEST WITHOUT CONTRAST TECHNIQUE: Multidetector CT imaging of the chest was performed following the standard protocol without intravenous contrast. High resolution imaging of the lungs, as well as inspiratory and expiratory imaging, was performed. COMPARISON:  03/13/2017 chest CT. FINDINGS: Cardiovascular: Normal heart size. No significant pericardial effusion/thickening. Left main and 3 vessel coronary atherosclerosis status post CABG. Atherosclerotic nonaneurysmal thoracic aorta. Normal caliber pulmonary arteries. Mediastinum/Nodes: No discrete thyroid nodules. Unremarkable esophagus. No pathologically enlarged axillary, mediastinal or hilar lymph nodes, noting limited sensitivity for the detection of hilar adenopathy on this noncontrast study. Lungs/Pleura: No pneumothorax. No pleural effusion. Severe centrilobular emphysema with diffuse bronchial wall thickening. Irregular solid posterior left lower lobe 2.6 x 1.8 cm pulmonary nodule (series 3/image 82), increased from 1.7 x 0.9 cm on 03/13/2017 chest CT. No acute consolidative airspace disease, lung masses or additional significant pulmonary nodules. Calcified tiny granulomas in both lungs are stable. No significant lobular air trapping on the expiration sequence. There is patchy subpleural reticulation and ground-glass attenuation in both lungs with associated mild traction bronchiectasis and architectural distortion, with a basilar predominance to these findings. There is mild-to-moderate honeycombing at the right greater than left lung bases.  These findings have all mildly increased since 03/13/2017 chest CT. Patchy reticulonodular opacities at the right greater than left lung apices are unchanged, favor postinfectious scarring. Upper abdomen: No acute abnormality. Musculoskeletal: No aggressive appearing focal osseous lesions. Stable large Schmorl's node with associated mild compression deformity in the  superior T10 endplate. Moderate thoracic spondylosis. Intact sternotomy wires. IMPRESSION: 1. Interval growth of irregular solid posterior left lower lobe 2.6 cm pulmonary nodule, worrisome for primary bronchogenic carcinoma. PET-CT is indicated for further characterization, as clinically warranted. 2. Spectrum of findings compatible with basilar predominant fibrotic interstitial lung disease with mild-to-moderate honeycombing, mildly worsened since 03/13/2017 chest CT, considered diagnostic of usual interstitial pneumonia (UIP). 3. Severe centrilobular emphysema with diffuse bronchial wall thickening, suggesting COPD. Aortic Atherosclerosis (ICD10-I70.0) and Emphysema (ICD10-J43.9). These results will be called to the ordering clinician or representative by the Radiologist Assistant, and communication documented in the PACS or zVision Dashboard. Electronically Signed   By: Ilona Sorrel M.D.   On: 10/05/2017 14:12   Nm Pet Image Initial (pi) Skull Base To Thigh  Result Date: 10/17/2017 CLINICAL DATA:  Initial treatment strategy for lung nodule. EXAM: NUCLEAR MEDICINE PET SKULL BASE TO THIGH TECHNIQUE: 6.4 mCi F-18 FDG was injected intravenously. Full-ring PET imaging was performed from the skull base to thigh after the radiotracer. CT data was obtained and used for attenuation correction and anatomic localization. Fasting blood glucose: 98 mg/dl COMPARISON:  CT chest 10/05/2017 FINDINGS: Mediastinal blood pool activity: SUV max 2.18 NECK: No hypermetabolic lymph nodes in the neck. Incidental CT findings: none CHEST: The posterior left lower lobe lung nodule measures 1.5 by 1.9 cm and has an SUV max of 5.99. No additional hypermetabolic pulmonary nodules. No hypermetabolic thoracic lymph nodes. Incidental CT findings: Changes of chronic interstitial lung disease are again noted. Findings likely reflect a combination of severe centrilobular emphysema and usual interstitial pneumonia. Aortic atherosclerosis noted.   Previous CABG procedure. ABDOMEN/PELVIS: No abnormal hypermetabolic activity within the liver, pancreas, adrenal glands, or spleen. No hypermetabolic lymph nodes in the abdomen or pelvis. Incidental CT findings: There is aortic atherosclerosis. No aneurysm. SKELETON: No focal hypermetabolic activity to suggest skeletal metastasis. Incidental CT findings: none IMPRESSION: 1. Moderate FDG uptake associated with the irregular enlarging subpleural nodule within the posterior left lower lobe with an SUV max of 5.99. Suspicious for malignancy. Correlation with tissue sampling advise. 2. Aortic Atherosclerosis (ICD10-I70.0) and Emphysema (ICD10-J43.9). 3. Chronic interstitial lung disease suggestive of usual interstitial pneumonia (UIP). Electronically Signed   By: Kerby Moors M.D.   On: 10/17/2017 17:50     Past medical hx Past Medical History:  Diagnosis Date  . Anemia   . Anginal pain ([place])   . Complication of anesthesia 1987   "didn't wake up for 5 days"  . COPD (chronic obstructive pulmonary disease) ([place])   . Coronary artery disease   . Exertional dyspnea 01/04/2012  . GI bleed due to NSAIDs 02/2012  . High cholesterol   . History of blood transfusion 1987; 11/25/2014   "emergent CABG; anemia"  . Hyperlipidemia   . Hypothyroidism   . Myocardial infarction ([place]) 1987  . Numbness and tingling in hands 01/04/2012   "right one goes to sleep on steering wheel when I'm driving; been that way long time; @ least 1 yr"  . Thyroid disease      Social History   Tobacco Use  . Smoking status: Former Smoker    Packs/day: 1.00    Years: 40.00    Pack  years: 40.00    Types: Cigarettes    Last attempt to quit: 05/08/1988    Years since quitting: 29.4  . Smokeless tobacco: Never Used  Substance Use Topics  . Alcohol use: No    Comment: 11/25/2014 "hadn't drank any alcohol in the 2000's"  . Drug use: No    [name] reports that he quit smoking about 29 years ago. His smoking use included  cigarettes. He has a 40.00 pack-year smoking history. He has never used smokeless tobacco. He reports that he does not drink alcohol or use drugs.  Tobacco Cessation: Counseling given: Yes   Past surgical hx, Family hx, Social hx all reviewed.  Current Outpatient Medications on File Prior to Visit  Medication Sig  . acetaminophen (TYLENOL) 325 MG tablet Take 2 tablets (650 mg total) by mouth every 4 (four) hours as needed for headache or mild pain.  Marland Kitchen aspirin 81 MG tablet Take 81 mg by mouth daily.  . ferrous sulfate 325 (65 FE) MG tablet Take 1 tablet (325 mg total) by mouth 2 (two) times daily with a meal. (Patient taking differently: Take 325 mg by mouth at bedtime. )  . levothyroxine (SYNTHROID, LEVOTHROID) 50 MCG tablet Take 50 mcg by mouth daily.  . Multiple Vitamin (MULTIVITAMIN WITH MINERALS) TABS Take 1 tablet by mouth daily.  Marland Kitchen NITROSTAT 0.4 MG SL tablet PLACE 1 TABLET (0.4 MG TOTAL) UNDER THE TONGUE EVERY 5 (FIVE) MINUTES AS NEEDED. FOR CHEST PAIN  . pantoprazole (PROTONIX) 40 MG tablet Take 40 mg by mouth daily.  . rosuvastatin (CRESTOR) 10 MG tablet Take 1 tablet (10 mg total) by mouth daily.   No current facility-administered medications on file prior to visit.      Allergies  Allergen Reactions  . Ibuprofen Other (See Comments)    Internal bleeding  . Morphine And Related Nausea And Vomiting  . Atorvastatin     Muscle pain    Review Of Systems:  Constitutional:   +  weight loss, No night sweats,  Fevers, chills, fatigue, or  lassitude.  HEENT:   No headaches,  Difficulty swallowing,  Tooth/dental problems, or  Sore throat,                No sneezing, itching, ear ache, nasal congestion, post nasal drip,   CV:  No chest pain,  Orthopnea, PND, swelling in lower extremities, anasarca, dizziness, palpitations, syncope.   GI  No heartburn, indigestion, + epigastric abdominal pain, No nausea, vomiting, diarrhea, change in bowel habits,+  loss of appetite, No bloody  stools.   Resp: Occasional shortness of breath with exertion or at rest.  No excess mucus, no productive cough,  No non-productive cough,  No coughing up of blood.  No change in color of mucus.  No wheezing.  No chest wall deformity  Skin: no rash or lesions.  GU: no dysuria, change in color of urine, no urgency or frequency.  No flank pain, no hematuria   MS:  No joint pain or swelling.  No decreased range of motion.  No back pain.  Psych:  No change in mood or affect. No depression or anxiety.  No memory loss.   Vital Signs BP 132/70 (BP Location: Left Arm, Cuff Size: Normal)   Pulse 75   Ht 5' 7.5" (1.715 m)   Wt 123 lb (55.8 kg)   SpO2 97%   BMI 18.98 kg/m    Physical Exam:  General- No distress,  A&Ox3, pleasant ENT: No sinus tenderness,  TM clear, pale nasal mucosa, no oral exudate,no post nasal drip, no LAN Cardiac: S1, S2, regular rate and rhythm, no murmur Chest: No wheeze/ rales/ dullness; no accessory muscle use, no nasal flaring, no sternal retractions, diminished per bases bilaterally Abd.: Soft Non-tender, complains of epigastric generalized pain, nondistended, bowel sounds positive, nonobese Ext: No clubbing cyanosis, edema Neuro:  normal strength, moving all extremities x4, alert and oriented x3, appropriate Skin: No rashes, warm and dry Psych: normal mood and behavior   Assessment/Plan  Pulmonary nodule Pulmonary nodule that has progressed in size over the last 6 months and is PET avid and suspicious for malignancy Plan We will refer you to Interventional radiology for biopsy of Left lower lobe lung nodule  ( Referral to IR for needle aspiration biopsy) You will get a call from a scheduler to get this set up.. Follow up With Dr. Lake Bells or Judson Roch NP after biopsy for the results. Please contact office for sooner follow up if symptoms do not improve or worsen or seek emergency care .  ILD (interstitial lung disease) ([place]) Currently on no maintenance  therapy per patient choice Plan We encourage you to consider initiation of Ofev  This can help slow the progression of your disease. Without this medication this disease will progress as Dr. Lake Bells explained at your last visit.    Magdalen Spatz, NP 10/24/2017  7:55 PM

## 2017-10-24 NOTE — Assessment & Plan Note (Deleted)
Continue using as needed albuterol for shortness of breath

## 2017-10-24 NOTE — Patient Instructions (Addendum)
It is nice to see you today. We will refer you to Interventional radiology for biopsy of Left lower lobe lung nodule  ( Referral to IR for needle aspiration biopsy) You will get a call from a scheduler to get this set up.. Follow up With Dr. Lake Bells or Judson Roch NP after biopsy for the results. Please contact office for sooner follow up if symptoms do not improve or worsen or seek emergency care .

## 2017-11-01 ENCOUNTER — Other Ambulatory Visit: Payer: Self-pay | Admitting: Radiology

## 2017-11-05 ENCOUNTER — Ambulatory Visit (HOSPITAL_COMMUNITY)
Admission: RE | Admit: 2017-11-05 | Discharge: 2017-11-05 | Disposition: A | Discharge status: Home / Self Care | Payer: Medicare Other | Source: Ambulatory Visit | Attending: Acute Care | Admitting: Acute Care

## 2017-11-05 ENCOUNTER — Ambulatory Visit (HOSPITAL_COMMUNITY)
Admission: RE | Admit: 2017-11-05 | Discharge: 2017-11-05 | Disposition: A | Discharge status: Home / Self Care | Payer: Medicare Other | Source: Ambulatory Visit | Attending: Interventional Radiology | Admitting: Interventional Radiology

## 2017-11-05 ENCOUNTER — Encounter (HOSPITAL_COMMUNITY): Payer: Self-pay

## 2017-11-05 DIAGNOSIS — Z87891 Personal history of nicotine dependence: Secondary | ICD-10-CM | POA: Diagnosis not present

## 2017-11-05 DIAGNOSIS — Z951 Presence of aortocoronary bypass graft: Secondary | ICD-10-CM | POA: Insufficient documentation

## 2017-11-05 DIAGNOSIS — Z7982 Long term (current) use of aspirin: Secondary | ICD-10-CM | POA: Insufficient documentation

## 2017-11-05 DIAGNOSIS — Z9889 Other specified postprocedural states: Secondary | ICD-10-CM

## 2017-11-05 DIAGNOSIS — J8410 Pulmonary fibrosis, unspecified: Secondary | ICD-10-CM | POA: Diagnosis not present

## 2017-11-05 DIAGNOSIS — C3432 Malignant neoplasm of lower lobe, left bronchus or lung: Secondary | ICD-10-CM | POA: Insufficient documentation

## 2017-11-05 DIAGNOSIS — I252 Old myocardial infarction: Secondary | ICD-10-CM | POA: Insufficient documentation

## 2017-11-05 DIAGNOSIS — Z886 Allergy status to analgesic agent status: Secondary | ICD-10-CM | POA: Insufficient documentation

## 2017-11-05 DIAGNOSIS — R911 Solitary pulmonary nodule: Secondary | ICD-10-CM | POA: Diagnosis present

## 2017-11-05 DIAGNOSIS — Z9842 Cataract extraction status, left eye: Secondary | ICD-10-CM | POA: Insufficient documentation

## 2017-11-05 DIAGNOSIS — Z885 Allergy status to narcotic agent status: Secondary | ICD-10-CM | POA: Insufficient documentation

## 2017-11-05 DIAGNOSIS — J439 Emphysema, unspecified: Secondary | ICD-10-CM | POA: Diagnosis not present

## 2017-11-05 DIAGNOSIS — Z961 Presence of intraocular lens: Secondary | ICD-10-CM | POA: Diagnosis not present

## 2017-11-05 DIAGNOSIS — Z9841 Cataract extraction status, right eye: Secondary | ICD-10-CM | POA: Insufficient documentation

## 2017-11-05 DIAGNOSIS — Z79890 Hormone replacement therapy (postmenopausal): Secondary | ICD-10-CM | POA: Diagnosis not present

## 2017-11-05 DIAGNOSIS — E785 Hyperlipidemia, unspecified: Secondary | ICD-10-CM | POA: Insufficient documentation

## 2017-11-05 DIAGNOSIS — Z79899 Other long term (current) drug therapy: Secondary | ICD-10-CM | POA: Insufficient documentation

## 2017-11-05 DIAGNOSIS — D649 Anemia, unspecified: Secondary | ICD-10-CM | POA: Insufficient documentation

## 2017-11-05 DIAGNOSIS — Z955 Presence of coronary angioplasty implant and graft: Secondary | ICD-10-CM | POA: Diagnosis not present

## 2017-11-05 DIAGNOSIS — Z888 Allergy status to other drugs, medicaments and biological substances status: Secondary | ICD-10-CM | POA: Insufficient documentation

## 2017-11-05 DIAGNOSIS — Z825 Family history of asthma and other chronic lower respiratory diseases: Secondary | ICD-10-CM | POA: Insufficient documentation

## 2017-11-05 DIAGNOSIS — E039 Hypothyroidism, unspecified: Secondary | ICD-10-CM | POA: Diagnosis not present

## 2017-11-05 DIAGNOSIS — I2510 Atherosclerotic heart disease of native coronary artery without angina pectoris: Secondary | ICD-10-CM | POA: Insufficient documentation

## 2017-11-05 LAB — CBC
HCT: 33.4 % — ABNORMAL LOW (ref 39.0–52.0)
Hemoglobin: 10.4 g/dL — ABNORMAL LOW (ref 13.0–17.0)
MCH: 29.3 pg (ref 26.0–34.0)
MCHC: 31.1 g/dL (ref 30.0–36.0)
MCV: 94.1 fL (ref 78.0–100.0)
PLATELETS: 197 10*3/uL (ref 150–400)
RBC: 3.55 MIL/uL — ABNORMAL LOW (ref 4.22–5.81)
RDW: 15.2 % (ref 11.5–15.5)
WBC: 7.5 10*3/uL (ref 4.0–10.5)

## 2017-11-05 LAB — PROTIME-INR
INR: 1.05
PROTHROMBIN TIME: 13.6 s (ref 11.4–15.2)

## 2017-11-05 MED ORDER — LIDOCAINE HCL 1 % IJ SOLN
INTRAMUSCULAR | Status: AC
  Filled 2017-11-05: qty 20

## 2017-11-05 MED ORDER — MIDAZOLAM HCL 2 MG/2ML IJ SOLN
INTRAMUSCULAR | Status: AC | PRN
  Administered 2017-11-05 (×2): 0.5 mg via INTRAVENOUS

## 2017-11-05 MED ORDER — FENTANYL CITRATE (PF) 100 MCG/2ML IJ SOLN
INTRAMUSCULAR | Status: AC
  Filled 2017-11-05: qty 4

## 2017-11-05 MED ORDER — MIDAZOLAM HCL 2 MG/2ML IJ SOLN
INTRAMUSCULAR | Status: AC
  Filled 2017-11-05: qty 4

## 2017-11-05 MED ORDER — SODIUM CHLORIDE 0.9 % IV SOLN: INTRAVENOUS | Status: DC

## 2017-11-05 MED ORDER — FENTANYL CITRATE (PF) 100 MCG/2ML IJ SOLN
INTRAMUSCULAR | Status: AC | PRN
  Administered 2017-11-05 (×2): 25 ug via INTRAVENOUS

## 2017-11-05 NOTE — Procedures (Signed)
Pre procedural Dx: Left lower lobe nodule  Post procedural Dx: Same  Technically successful CT guided biopsy of hypermetabolic left lower lobe nodule.    EBL: None.   Complications: None immediate.   Ronny Bacon, MD Pager #: [phone]

## 2017-11-05 NOTE — Discharge Instructions (Signed)
Lung Biopsy, Care After °This sheet gives you information about how to care for yourself after your procedure. Your health care provider may also give you more specific instructions depending on the type of biopsy you had. If you have problems or questions, contact your health care provider. °What can I expect after the procedure? °After the procedure, it is common to have: °· A cough. °· A sore throat. °· Pain where a needle, bronchoscope, or incision was used to collect a biopsy sample (biopsy site). ° °Follow these instructions at home: °Medicines °· Take over-the-counter and prescription medicines only as told by your health care provider. °· Do not drive for 24 hours if you were given a sedative. °· Do not drink alcohol while taking pain medicine. °· Do not drive or use heavy machinery while taking prescription pain medicine. °· To prevent or treat constipation while you are taking prescription pain medicine, your health care provider may recommend that you: °? Drink enough fluid to keep your urine clear or pale yellow. °? Take over-the-counter or prescription medicines. °? Eat foods that are high in fiber, such as fresh fruits and vegetables, whole grains, and beans. °? Limit foods that are high in fat and processed sugars, such as fried and sweet foods. °Activity °· If you had an incision during your procedure, avoid activities that may pull the incision site open. °· Return to your normal activities as told by your health care provider. Ask your health care provider what activities are safe for you. °If you had an open biopsy:  °· Follow instructions from your health care provider about how to take care of your incision. Make sure you: °? Wash your hands with soap and water before you change your bandage (dressing). If soap and water are not available, use hand sanitizer. °? Change your dressing as told by your health care provider. °? Leave stitches (sutures), skin glue, or adhesive strips in place. These  skin closures may need to stay in place for 2 weeks or longer. If adhesive strip edges start to loosen and curl up, you may trim the loose edges. Do not remove adhesive strips completely unless your health care provider tells you to do that. °· Check your incision area every day for signs of infection. Check for: °? Redness, swelling, or pain. °? Fluid or blood. °? Warmth. °? Pus or a bad smell. °General instructions °· It is up to you to get the results of your procedure. Ask your health care provider, or the department that is doing the procedure, when your results will be ready. °Contact a health care provider if: °· You have a fever. °· You have redness, swelling, or pain around your biopsy site. °· You have fluid or blood coming from your biopsy site. °· Your biopsy site feels warm to the touch. °· You have pus or a bad smell coming from your biopsy site. °Get help right away if: °· You cough up blood. °· You have trouble breathing. °· You have chest pain. °Summary °· After the procedure, it is common to have a sore throat and a cough. °· Return to your normal activities as told by your health care provider. Ask your health care provider what activities are safe for you. °· Take over-the-counter and prescription medicines only as told by your health care provider. °· Report any unusual symptoms to your health care provider. °This information is not intended to replace advice given to you by your health care provider. Make sure   you discuss any questions you have with your health care provider. °Document Released: 05/23/2016 Document Revised: 05/23/2016 Document Reviewed: 05/23/2016 °Elsevier Interactive Patient Education © 2018 [employer] ° °

## 2017-11-05 NOTE — H&P (Signed)
Chief Complaint: Patient was seen in consultation today for left lung mass biopsy at the request of Groce,Sarah F  Referring Physician(s): Magdalen Spatz Dr Despina Hick  Supervising Physician: Sandi Mariscal  Patient Status: [facility] - Out-pt  History of Present Illness: Timothy COMELLA Sr. is a 84 y.o. male   Previous smoker Quit 1990 COPD and emphysema Pulmonary fibrosis  Left lung nodule Enlarging per imaging and +PET IMPRESSION: 1. Moderate FDG uptake associated with the irregular enlarging subpleural nodule within the posterior left lower lobe with an SUV max of 5.99. Suspicious for malignancy. Correlation with tissue sampling advise.  Now scheduled for biopsy of same Timothy Pih NP note 10/24/17: Pulmonary nodule Pulmonary nodule that has progressed in size over the last 6 months and is PET avid and suspicious for malignancy Plan We will refer you to Interventional radiology for biopsy of Left lower lobe lung nodule  ( Referral to IR for needle aspiration biopsy)    Past Medical History:  Diagnosis Date  . Anemia   . Anginal pain ([place])   . Complication of anesthesia 1987   "didn't wake up for 5 days"  . COPD (chronic obstructive pulmonary disease) ([place])   . Coronary artery disease   . Exertional dyspnea 01/04/2012  . GI bleed due to NSAIDs 02/2012  . High cholesterol   . History of blood transfusion 1987; 11/25/2014   "emergent CABG; anemia"  . Hyperlipidemia   . Hypothyroidism   . Myocardial infarction ([place]) 1987  . Numbness and tingling in hands 01/04/2012   "right one goes to sleep on steering wheel when I'm driving; been that way long time; @ least 1 yr"  . Thyroid disease     Past Surgical History:  Procedure Laterality Date  . CARDIAC CATHETERIZATION    . CARDIAC CATHETERIZATION N/A 09/10/2014   Procedure: Left Heart Cath and Cors/Grafts Angiography;  Surgeon: Peter M Martinique, MD;  Location: [facility] INVASIVE CV LAB CUPID;  Service: Cardiovascular;  Laterality:  N/A;  . CATARACT EXTRACTION W/ INTRAOCULAR LENS  IMPLANT, BILATERAL Bilateral 2012  . COLONOSCOPY N/A 12/04/2014   Procedure: COLONOSCOPY;  Surgeon: Teena Irani, MD;  Location[facility] ENDOSCOPY;  Service: Endoscopy;  Laterality: N/A;  . COLONOSCOPY W/ POLYPECTOMY  2011   "had to take it out in 3 pieces; all benign"  . CORONARY ANGIOPLASTY WITH STENT PLACEMENT  ? 1990's   "3"  . CORONARY ANGIOPLASTY WITH STENT PLACEMENT  01/04/2012   "2; total of 5"  . CORONARY ARTERY BYPASS GRAFT  1987   CABG X1  . ESOPHAGOGASTRODUODENOSCOPY  02/07/2012   Procedure: ESOPHAGOGASTRODUODENOSCOPY (EGD);  Surgeon: Wonda Horner, MD;  Location: [facility] ENDOSCOPY;  Service: Endoscopy;  Laterality: N/A;  . ESOPHAGOGASTRODUODENOSCOPY (EGD) WITH PROPOFOL Left 11/26/2014   Procedure: ESOPHAGOGASTRODUODENOSCOPY (EGD) WITH PROPOFOL;  Surgeon: Arta Silence, MD;  Location: [facility] ENDOSCOPY;  Service: Endoscopy;  Laterality: Left;  . PERCUTANEOUS CORONARY STENT INTERVENTION (PCI-S) N/A 01/04/2012   Procedure: PERCUTANEOUS CORONARY STENT INTERVENTION (PCI-S);  Surgeon: Sinclair Grooms, MD;  Location: [facility] CATH LAB;  Service: Cardiovascular;  Laterality: N/A;  . WRIST FRACTURE SURGERY Left ~ 2009  . WRIST HARDWARE REMOVAL Left ~ 2010   "took steel bar out"    Allergies: Ibuprofen; Morphine and related; and Atorvastatin  Medications: Prior to Admission medications   Medication Sig Start Date End Date Taking? Authorizing Provider  acetaminophen (TYLENOL) 325 MG tablet Take 2 tablets (650 mg total) by mouth every 4 (four) hours as needed for headache  or mild pain. Patient taking differently: Take 325 mg by mouth every 6 (six) hours as needed for mild pain or headache.  09/10/14  Yes Erlene Quan, PA-C  aspirin 81 MG tablet Take 81 mg by mouth daily.   Yes [provider]  ferrous sulfate 325 (65 FE) MG tablet Take 1 tablet (325 mg total) by mouth 2 (two) times daily with a meal. Patient taking differently: Take 325 mg by mouth  daily.  12/05/14  Yes Thurnell Lose, MD  levothyroxine (SYNTHROID, LEVOTHROID) 50 MCG tablet Take 50 mcg by mouth daily before breakfast.  01/30/16  Yes [provider]  Multiple Vitamin (MULTIVITAMIN WITH MINERALS) TABS Take 1 tablet by mouth daily.   Yes [provider]  pantoprazole (PROTONIX) 40 MG tablet Take 40 mg by mouth daily.   Yes [provider]  rosuvastatin (CRESTOR) 10 MG tablet Take 1 tablet (10 mg total) by mouth daily. 06/27/17  Yes Skains, Thana Farr, MD  NITROSTAT 0.4 MG SL tablet PLACE 1 TABLET (0.4 MG TOTAL) UNDER THE TONGUE EVERY 5 (FIVE) MINUTES AS NEEDED. FOR CHEST PAIN 05/18/16   Jerline Pain, MD     Family History  Problem Relation Age of Onset  . Emphysema Father        ? if he smoked or not     Social History   Socioeconomic History  . Marital status: Married    Spouse name: Not on file  . Number of children: Not on file  . Years of education: Not on file  . Highest education level: Not on file  Occupational History  . Not on file  Social Needs  . Financial resource strain: Not on file  . Food insecurity:    Worry: Not on file    Inability: Not on file  . Transportation needs:    Medical: Not on file    Non-medical: Not on file  Tobacco Use  . Smoking status: Former Smoker    Packs/day: 1.00    Years: 40.00    Pack years: 40.00    Types: Cigarettes    Last attempt to quit: 05/08/1988    Years since quitting: 29.5  . Smokeless tobacco: Never Used  Substance and Sexual Activity  . Alcohol use: No    Comment: 11/25/2014 "hadn't drank any alcohol in the 2000's"  . Drug use: No  . Sexual activity: Yes  Lifestyle  . Physical activity:    Days per week: Not on file    Minutes per session: Not on file  . Stress: Not on file  Relationships  . Social connections:    Talks on phone: Not on file    Gets together: Not on file    Attends religious service: Not on file    Active member of club or organization: Not on file     Attends meetings of clubs or organizations: Not on file    Relationship status: Not on file  Other Topics Concern  . Not on file  Social History Narrative   ** Merged History Encounter **        Review of Systems: A 12 point ROS discussed and pertinent positives are indicated in the HPI above.  All other systems are negative.  Review of Systems  Constitutional: Negative for activity change, fatigue and fever.  Respiratory: Positive for shortness of breath. Negative for cough.   Cardiovascular: Negative for chest pain.  Gastrointestinal: Negative for abdominal pain.  Neurological: Positive for weakness.  Psychiatric/Behavioral: Negative for behavioral problems and confusion.    Vital Signs: BP 128/66   Pulse 60   Ht 5\' 7"  (1.702 m)   Wt 129 lb (58.5 kg)   SpO2 95%   BMI 20.20 kg/m   Physical Exam  Constitutional: He is oriented to person, place, and time.  Thin Frail  Cardiovascular: Normal rate, regular rhythm and normal heart sounds.  Pulmonary/Chest: Effort normal and breath sounds normal. He has no wheezes.  Abdominal: Soft. Bowel sounds are normal.  Musculoskeletal: Normal range of motion.  Neurological: He is alert and oriented to person, place, and time.  Skin: Skin is warm and dry.  Psychiatric: He has a normal mood and affect. His behavior is normal. Judgment and thought content normal.  Nursing note and vitals reviewed.   Imaging: Nm Pet Image Initial (pi) Skull Base To Thigh  Result Date: 10/17/2017 CLINICAL DATA:  Initial treatment strategy for lung nodule. EXAM: NUCLEAR MEDICINE PET SKULL BASE TO THIGH TECHNIQUE: 6.4 mCi F-18 FDG was injected intravenously. Full-ring PET imaging was performed from the skull base to thigh after the radiotracer. CT data was obtained and used for attenuation correction and anatomic localization. Fasting blood glucose: 98 mg/dl COMPARISON:  CT chest 10/05/2017 FINDINGS: Mediastinal blood pool activity: SUV max 2.18 NECK: No  hypermetabolic lymph nodes in the neck. Incidental CT findings: none CHEST: The posterior left lower lobe lung nodule measures 1.5 by 1.9 cm and has an SUV max of 5.99. No additional hypermetabolic pulmonary nodules. No hypermetabolic thoracic lymph nodes. Incidental CT findings: Changes of chronic interstitial lung disease are again noted. Findings likely reflect a combination of severe centrilobular emphysema and usual interstitial pneumonia. Aortic atherosclerosis noted.  Previous CABG procedure. ABDOMEN/PELVIS: No abnormal hypermetabolic activity within the liver, pancreas, adrenal glands, or spleen. No hypermetabolic lymph nodes in the abdomen or pelvis. Incidental CT findings: There is aortic atherosclerosis. No aneurysm. SKELETON: No focal hypermetabolic activity to suggest skeletal metastasis. Incidental CT findings: none IMPRESSION: 1. Moderate FDG uptake associated with the irregular enlarging subpleural nodule within the posterior left lower lobe with an SUV max of 5.99. Suspicious for malignancy. Correlation with tissue sampling advise. 2. Aortic Atherosclerosis (ICD10-I70.0) and Emphysema (ICD10-J43.9). 3. Chronic interstitial lung disease suggestive of usual interstitial pneumonia (UIP). Electronically Signed   By: Kerby Moors M.D.   On: 10/17/2017 17:50    Labs:  CBC: No results for input(s): WBC, HGB, HCT, PLT in the last 8760 hours.  COAGS: No results for input(s): INR, APTT in the last 8760 hours.  BMP: No results for input(s): NA, K, CL, CO2, GLUCOSE, BUN, CALCIUM, CREATININE, GFRNONAA, GFRAA in the last 8760 hours.  Invalid input(s): CMP  LIVER FUNCTION TESTS: No results for input(s): BILITOT, AST, ALT, ALKPHOS, PROT, ALBUMIN in the last 8760 hours.  TUMOR MARKERS: No results for input(s): AFPTM, CEA, CA199, CHROMGRNA in the last 8760 hours.  Assessment and Plan:  Left lung nodule Enlarging; +PET Scheduled for biopsy today Risks and benefits discussed with the  patient including, but not limited to bleeding, hemoptysis, respiratory failure requiring intubation, infection, pneumothorax requiring chest tube placement, stroke from air embolism or even death.  All of the patient's questions were answered, patient is agreeable to proceed. Consent signed and in chart.   Thank you for this interesting consult.  I greatly enjoyed meeting Timothy WUTHRICH Sr. and look forward to participating in their care.  A copy of this report was sent to the requesting provider on this date.  Electronically Signed: Lavonia Drafts, PA-C 11/05/2017, 9:44 AM   I spent a total of  30 Minutes   in face to face in clinical consultation, greater than 50% of which was counseling/coordinating care for left lung nodule biopsy

## 2017-11-07 ENCOUNTER — Telehealth: Payer: Self-pay | Admitting: Pulmonary Disease

## 2017-11-07 DIAGNOSIS — C3400 Malignant neoplasm of unspecified main bronchus: Secondary | ICD-10-CM

## 2017-11-07 NOTE — Telephone Encounter (Signed)
I called [name] to review the results of the biopsy that showed non-small cell lung cancer.  I explained that we would refer them to the [place] center in [place] and they voiced understanding.  I answered a few questions for her while on the phone.  Order for referral to Thoracic Oncology placed.

## 2017-11-12 ENCOUNTER — Telehealth: Payer: Self-pay | Admitting: *Deleted

## 2017-11-12 ENCOUNTER — Telehealth: Payer: Self-pay | Admitting: Pulmonary Disease

## 2017-11-12 NOTE — Telephone Encounter (Signed)
Forwarding to BQ as FYI: referral has been placed but appt has not yet been scheduled.

## 2017-11-12 NOTE — Telephone Encounter (Signed)
She states she has done some research and has talked to a few people about lung surgeons and would like to know if Dr. Lake Bells has found out anything yet as they are anxious.  States she was given a few names but wants to go with whoever Dr. Lake Bells thinks is the best.

## 2017-11-12 NOTE — Telephone Encounter (Signed)
Called and spoke with pt's spouse Everlene Farrier who stated BQ had called them last Wednesday,  11/07/17 to discuss biopsy.  Everlene Farrier stated BQ told her that if they had not received a call from BQ by Friday to discuss plan of care after biopsy to call the office.  Dr. Lake Bells, please advise on this for pt. Thanks!

## 2017-11-12 NOTE — Telephone Encounter (Signed)
This type of referral's are review by the doctor before they are called it states it's in review.

## 2017-11-12 NOTE — Telephone Encounter (Signed)
Oncology Nurse Navigator Documentation  Oncology Nurse Navigator Flowsheets 11/12/2017  Navigator Location [facility]  Navigator Encounter Type Telephone/I called to schedule [name]. I was unable to reach him. I did leave a vm message with my name and phone number to call.   Telephone Outgoing Call  Treatment Phase Pre-Tx/Tx Discussion  Barriers/Navigation Needs Coordination of Care  Interventions Coordination of Care  Coordination of Care Other  Acuity Level 1  Time Spent with Patient 15

## 2017-11-12 NOTE — Telephone Encounter (Signed)
Confirm they have a thoracic oncology appointment

## 2017-11-12 NOTE — Telephone Encounter (Signed)
Looked at pt's upcoming appts and pt has not been scheduled for a referral to thoracic oncology.  Called and spoke with pt's spouse Everlene Farrier to see if they had heard anything about scheduling pt an appt to see a thoracic oncologist and Everlene Farrier stated they have not heard anything.  PCC's please advise on this for pt and spouse and if anything else needs to be done to be able to get pt scheduled for the referral. Thanks!

## 2017-11-13 ENCOUNTER — Telehealth: Payer: Self-pay | Admitting: *Deleted

## 2017-11-13 NOTE — Telephone Encounter (Signed)
Oncology Nurse Navigator Documentation  Oncology Nurse Navigator Flowsheets 11/13/2017  Navigator Location [facility]  Navigator Encounter Type Telephone/I called patient and updated on appt. Patient will be seen at [facility] 11/22/17.    Telephone Outgoing Call  Treatment Phase Pre-Tx/Tx Discussion  Barriers/Navigation Needs Coordination of Care  Interventions Coordination of Care  Coordination of Care Appts  Acuity Level 1  Time Spent with Patient 15

## 2017-11-14 NOTE — Telephone Encounter (Signed)
Can someone please check to see if he has an oncology appointment?

## 2017-11-14 NOTE — Telephone Encounter (Signed)
  Spoke with Everlene Farrier and she they have an appt on 7/18 at 2:45pm. Nothing further is needed.   Type Date User Summary Attachment  General 11/13/2017 10:53 AM Valrie Hart, RN Auto: Referral message -  Note   ----- Message ----- From: Valrie Hart, RN Sent: 11/13/2017  10:53 AM To: Anna Patient is scheduled for [facility] next week. Thanks Hinton Dyer  ----- Message ----- From: Nena Polio Sent: 11/07/2017   2:46 PM To: Valrie Hart, RN  New referral. Please review

## 2017-11-15 ENCOUNTER — Other Ambulatory Visit: Payer: Self-pay | Admitting: *Deleted

## 2017-11-19 ENCOUNTER — Ambulatory Visit: Payer: Medicare Other | Admitting: Acute Care

## 2017-11-22 ENCOUNTER — Ambulatory Visit
Admission: RE | Admit: 2017-11-22 | Discharge: 2017-11-22 | Disposition: A | Discharge status: Home / Self Care | Payer: Medicare Other | Source: Ambulatory Visit | Attending: Radiation Oncology | Admitting: Radiation Oncology

## 2017-11-22 ENCOUNTER — Institutional Professional Consult (permissible substitution) (INDEPENDENT_AMBULATORY_CARE_PROVIDER_SITE_OTHER): Payer: Medicare Other | Admitting: Cardiothoracic Surgery

## 2017-11-22 ENCOUNTER — Other Ambulatory Visit: Payer: Self-pay | Admitting: *Deleted

## 2017-11-22 VITALS — BP 120/60 | HR 61 | Temp 97.5°F | Resp 17 | Wt 123.3 lb

## 2017-11-22 DIAGNOSIS — C3410 Malignant neoplasm of upper lobe, unspecified bronchus or lung: Secondary | ICD-10-CM

## 2017-11-22 DIAGNOSIS — C3432 Malignant neoplasm of lower lobe, left bronchus or lung: Secondary | ICD-10-CM
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
## 2017-11-29 ENCOUNTER — Ambulatory Visit
Admission: RE | Admit: 2017-11-29 | Discharge: 2017-11-29 | Disposition: A | Discharge status: Home / Self Care | Payer: Medicare Other | Source: Ambulatory Visit | Attending: Radiation Oncology | Admitting: Radiation Oncology

## 2017-11-29 DIAGNOSIS — Z510 Encounter for antineoplastic radiation therapy: Secondary | ICD-10-CM | POA: Insufficient documentation

## 2017-11-29 DIAGNOSIS — C3432 Malignant neoplasm of lower lobe, left bronchus or lung: Secondary | ICD-10-CM | POA: Insufficient documentation

## 2017-11-29 NOTE — Progress Notes (Signed)
  Radiation Oncology         [phone]) [phone] ________________________________  Name: Timothy Millin Sr. 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  Identity was confirmed.  All relevant records and images related to the planned course of therapy were reviewed.  The patient freely provided informed written consent to proceed with treatment after reviewing the details related to the planned course of therapy. The consent form was witnessed and verified by the simulation staff.  Then, the patient was set-up in a stable reproducible  supine position for radiation therapy.  A BodyFix immobilization pillow was fabricated for reproducible positioning.  Then I personally applied the abdominal compression paddle to limit respiratory excursion.  4D respiratoy motion management CT images were obtained.  Surface markings were placed.  The CT images were loaded into the planning software.  Then, using Cine, MIP, and standard views, the internal target volume (ITV) and planning target volumes (PTV) were delinieated, and avoidance structures were contoured.  Treatment planning then occurred.  The radiation prescription was entered and confirmed.  A total of two complex treatment devices were fabricated in the form of the BodyFix immobilization pillow and a neck accuform cushion.  I have requested : 3D Simulation  I have requested a DVH of the following structures: Heart, Lungs, Esophagus, Chest Wall, Brachial Plexus, Major Blood Vessels, and targets.  PLAN:  The patient will receive 54 Gy in 3 fractions.  -----------------------------------  Blair Promise, PhD, MD

## 2017-12-04 DIAGNOSIS — C3432 Malignant neoplasm of lower lobe, left bronchus or lung: Secondary | ICD-10-CM | POA: Diagnosis not present

## 2017-12-06 ENCOUNTER — Ambulatory Visit
Admission: RE | Admit: 2017-12-06 | Discharge: 2017-12-06 | Disposition: A | Discharge status: Home / Self Care | Payer: Medicare Other | Source: Ambulatory Visit | Attending: Radiation Oncology | Admitting: Radiation Oncology

## 2017-12-06 DIAGNOSIS — C3432 Malignant neoplasm of lower lobe, left bronchus or lung: Secondary | ICD-10-CM | POA: Diagnosis present

## 2017-12-06 DIAGNOSIS — Z510 Encounter for antineoplastic radiation therapy: Secondary | ICD-10-CM | POA: Diagnosis not present

## 2017-12-06 NOTE — Progress Notes (Signed)
  Radiation Oncology         [phone]) [phone] ________________________________  Name: [name] MRN: [id number]  Date: 12/06/2017  DOB: Nov 24, 1933  Stereotactic Body Radiotherapy Treatment Procedure Note  NARRATIVE:  WAGNER TANZI Sr. was brought to the stereotactic radiation treatment machine and placed supine on the CT couch. The patient was set up for stereotactic body radiotherapy on the body fix pillow.  3D TREATMENT PLANNING AND DOSIMETRY:  The patient's radiation plan was reviewed and approved prior to starting treatment.  It showed 3-dimensional radiation distributions overlaid onto the planning CT.  The [facility] for the target structures as well as the organs at risk were reviewed. The documentation of this is filed in the radiation oncology EMR.  SIMULATION VERIFICATION:  The patient underwent CT imaging on the treatment unit.  These were carefully aligned to document that the ablative radiation dose would cover the target volume and maximally spare the nearby organs at risk according to the planned distribution.  SPECIAL TREATMENT PROCEDURE: [name] received high dose ablative stereotactic body radiotherapy to the planned target volume without unforeseen complications. Treatment was delivered uneventfully. The high doses associated with stereotactic body radiotherapy and the significant potential risks require careful treatment set up and patient monitoring constituting a special treatment procedure   STEREOTACTIC TREATMENT MANAGEMENT:  Following delivery, the patient was evaluated clinically. The patient tolerated treatment without significant acute effects, and was discharged to home in stable condition.    PLAN: Continue treatment as planned.  ________________________________  Blair Promise, PhD, MD  This document serves as a record of services personally performed by Gery Pray, MD. It was created on his behalf by Wilburn Mylar, a trained medical scribe. The  creation of this record is based on the scribe's personal observations and the provider's statements to them. This document has been checked and approved by the attending provider.

## 2017-12-10 ENCOUNTER — Ambulatory Visit
Admission: RE | Admit: 2017-12-10 | Discharge: 2017-12-10 | Disposition: A | Discharge status: Home / Self Care | Payer: Medicare Other | Source: Ambulatory Visit | Attending: Radiation Oncology | Admitting: Radiation Oncology

## 2017-12-10 DIAGNOSIS — C3432 Malignant neoplasm of lower lobe, left bronchus or lung: Secondary | ICD-10-CM

## 2017-12-10 DIAGNOSIS — Z510 Encounter for antineoplastic radiation therapy: Secondary | ICD-10-CM | POA: Diagnosis not present

## 2017-12-10 NOTE — Progress Notes (Signed)
  Radiation Oncology         [phone]) [phone] ________________________________  Name: [name] MRN: [id number]  Date: 12/10/2017  DOB: [birth year]  Stereotactic Body Radiotherapy Treatment Procedure Note  NARRATIVE:  IZSAK MEIR Sr. was brought to the stereotactic radiation treatment machine and placed supine on the CT couch. The patient was set up for stereotactic body radiotherapy on the body fix pillow.  3D TREATMENT PLANNING AND DOSIMETRY:  The patient's radiation plan was reviewed and approved prior to starting treatment.  It showed 3-dimensional radiation distributions overlaid onto the planning CT.  The [facility] for the target structures as well as the organs at risk were reviewed. The documentation of this is filed in the radiation oncology EMR.  SIMULATION VERIFICATION:  The patient underwent CT imaging on the treatment unit.  These were carefully aligned to document that the ablative radiation dose would cover the target volume and maximally spare the nearby organs at risk according to the planned distribution.  SPECIAL TREATMENT PROCEDURE: [name] received high dose ablative stereotactic body radiotherapy to the planned target volume without unforeseen complications. Treatment was delivered uneventfully. The high doses associated with stereotactic body radiotherapy and the significant potential risks require careful treatment set up and patient monitoring constituting a special treatment procedure   STEREOTACTIC TREATMENT MANAGEMENT:  Following delivery, the patient was evaluated clinically. The patient tolerated treatment without significant acute effects, and was discharged to home in stable condition.    PLAN: Continue treatment as planned.  ________________________________  Blair Promise, PhD, MD  This document serves as a record of services personally performed by Gery Pray MD. It was created on his behalf by Delton Coombes, a trained medical scribe. The  creation of this record is based on the scribe's personal observations and the provider's statements to them.

## 2017-12-13 ENCOUNTER — Ambulatory Visit
Admission: RE | Admit: 2017-12-13 | Discharge: 2017-12-13 | Disposition: A | Discharge status: Home / Self Care | Payer: Medicare Other | Source: Ambulatory Visit | Attending: Radiation Oncology | Admitting: Radiation Oncology

## 2017-12-13 ENCOUNTER — Encounter: Payer: Self-pay | Admitting: Radiation Oncology

## 2017-12-13 DIAGNOSIS — C3432 Malignant neoplasm of lower lobe, left bronchus or lung: Secondary | ICD-10-CM

## 2017-12-13 DIAGNOSIS — Z510 Encounter for antineoplastic radiation therapy: Secondary | ICD-10-CM | POA: Diagnosis not present

## 2017-12-13 NOTE — Progress Notes (Signed)
  Radiation Oncology         [phone]) [phone] ________________________________  Name: Timothy Millin Sr. MRN: [id number]  Date: 12/13/2017  DOB: [birth year]  Stereotactic Body Radiotherapy Treatment Procedure Note  NARRATIVE:  Timothy ON Sr. was brought to the stereotactic radiation treatment machine and placed supine on the CT couch. The patient was set up for stereotactic body radiotherapy on the body fix pillow.  3D TREATMENT PLANNING AND DOSIMETRY:  The patient's radiation plan was reviewed and approved prior to starting treatment.  It showed 3-dimensional radiation distributions overlaid onto the planning CT.  The [facility] for the target structures as well as the organs at risk were reviewed. The documentation of this is filed in the radiation oncology EMR.  SIMULATION VERIFICATION:  The patient underwent CT imaging on the treatment unit.  These were carefully aligned to document that the ablative radiation dose would cover the target volume and maximally spare the nearby organs at risk according to the planned distribution.  SPECIAL TREATMENT PROCEDURE: Timothy Millin Sr. received high dose ablative stereotactic body radiotherapy to the planned target volume without unforeseen complications. Treatment was delivered uneventfully. The high doses associated with stereotactic body radiotherapy and the significant potential risks require careful treatment set up and patient monitoring constituting a special treatment procedure   STEREOTACTIC TREATMENT MANAGEMENT:  Following delivery, the patient was evaluated clinically. The patient tolerated treatment without significant acute effects, and was discharged to home in stable condition.    PLAN: The patient has completed radiation therapy.  ________________________________  Blair Promise, PhD, MD  This document serves as a record of services personally performed by Gery Pray, MD. It was created on his behalf by Wilburn Mylar, a trained  medical scribe. The creation of this record is based on the scribe's personal observations and the provider's statements to them. This document has been checked and approved by the attending provider.

## 2017-12-13 NOTE — Progress Notes (Signed)
  Radiation Oncology         [phone]) [phone] ________________________________  Name: Timothy Millin Sr. 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 He reported left chest pain, moderate fatigue, shortness of breath with activity. He denied difficulty swallowing and any skin changes. His appetite remained good.  Plan: The patient has completed radiation treatment. The patient will return to radiation oncology clinic for routine followup in one month. I advised them to call or return sooner if they have any questions or concerns related to their recovery or treatment.  -----------------------------------  Blair Promise, PhD, MD  This document serves as a record of services personally performed by Gery Pray, MD. It was created on his behalf by Wilburn Mylar, a trained medical scribe. The creation of this record is based on the scribe's personal observations and the provider's statements to them. This document has been checked and approved by the attending provider.

## 2018-01-11 ENCOUNTER — Encounter: Payer: Self-pay | Admitting: *Deleted

## 2018-01-14 ENCOUNTER — Ambulatory Visit
Admission: RE | Admit: 2018-01-14 | Discharge: 2018-01-14 | Disposition: A | Discharge status: Home / Self Care | Payer: Medicare Other | Source: Ambulatory Visit | Attending: Radiation Oncology | Admitting: Radiation Oncology

## 2018-01-14 ENCOUNTER — Encounter: Payer: Self-pay | Admitting: Radiation Oncology

## 2018-01-14 ENCOUNTER — Other Ambulatory Visit: Payer: Self-pay

## 2018-01-14 VITALS — BP 114/57 | HR 66 | Temp 98.3°F | Wt 120.8 lb

## 2018-01-14 DIAGNOSIS — C3410 Malignant neoplasm of upper lobe, unspecified bronchus or lung: Secondary | ICD-10-CM | POA: Diagnosis present

## 2018-01-14 DIAGNOSIS — Z885 Allergy status to narcotic agent status: Secondary | ICD-10-CM | POA: Diagnosis not present

## 2018-01-14 DIAGNOSIS — Z886 Allergy status to analgesic agent status: Secondary | ICD-10-CM | POA: Diagnosis not present

## 2018-01-14 DIAGNOSIS — Z7982 Long term (current) use of aspirin: Secondary | ICD-10-CM | POA: Insufficient documentation

## 2018-01-14 DIAGNOSIS — Z79890 Hormone replacement therapy (postmenopausal): Secondary | ICD-10-CM | POA: Insufficient documentation

## 2018-01-14 DIAGNOSIS — Z79899 Other long term (current) drug therapy: Secondary | ICD-10-CM | POA: Diagnosis not present

## 2018-01-14 DIAGNOSIS — C3432 Malignant neoplasm of lower lobe, left bronchus or lung: Secondary | ICD-10-CM

## 2018-01-14 NOTE — Progress Notes (Signed)
Radiation Oncology         [phone]) [phone] ________________________________  Name: Timothy Millin Sr. MRN: [id number]  Date: 01/14/2018  DOB: 01-19-1934  Follow-Up Visit Note  CC: Timothy Neer, MD  Juanito Doom, MD    ICD-10-CM   1. 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 He is doing well overall. He is accompanied by his wife and daughter today. He is not on oxygen at home at this time.   On review of systems, he reports dizziness, "fuzzy" vision, and constant lower abdominal pain. He is not taking any pain medications for his constant lower abdominal pain, although he is taking tylenol PRN. He has an eye appointment on next week.  he denies appetite change, HA, and any other symptoms. Pertinent positives are listed and detailed within the above HPI.                 ALLERGIES:  is allergic to ibuprofen; morphine and related; and atorvastatin.  Meds: Current Outpatient Medications  Medication Sig Dispense Refill  . acetaminophen (TYLENOL) 325 MG tablet Take 2 tablets (650 mg total) by mouth every 4 (four) hours as needed for headache or mild pain. (Patient taking differently: Take 325 mg by mouth every 6 (six) hours as needed for mild pain or headache. )    . aspirin 81 MG tablet Take 81 mg by mouth daily.    . ferrous sulfate 325 (65 FE) MG tablet Take 1 tablet (325 mg total) by mouth 2 (two) times daily with a meal. (Patient taking differently: Take 325 mg by mouth daily. ) 60 tablet 0  . levothyroxine (SYNTHROID, LEVOTHROID) 50 MCG tablet Take 50 mcg by mouth daily before  breakfast.     . Multiple Vitamin (MULTIVITAMIN WITH MINERALS) TABS Take 1 tablet by mouth daily.    Marland Kitchen NITROSTAT 0.4 MG SL tablet PLACE 1 TABLET (0.4 MG TOTAL) UNDER THE TONGUE EVERY 5 (FIVE) MINUTES AS NEEDED. FOR CHEST PAIN 25 tablet 2  . rosuvastatin (CRESTOR) 10 MG tablet Take 1 tablet (10 mg total) by mouth daily. 90 tablet 3  . pantoprazole (PROTONIX) 40 MG tablet Take 40 mg by mouth daily.     No current facility-administered medications for this encounter.     Physical Findings: The patient is in no acute distress. Patient is alert and oriented.  weight is 120 lb 12.8 oz (54.8 kg). His oral temperature is 98.3 F (36.8 C). His blood pressure is 114/57 (abnormal) and his pulse is 66. His oxygen saturation is 96%.   Lungs are clear to auscultation bilaterally. Heart has regular rate and rhythm. No palpable cervical, supraclavicular, or axillary adenopathy. Abdomen soft, non-tender, normal bowel sounds.   Lab Findings: Lab Results  Component Value Date   WBC 7.5 11/05/2017   HGB 10.4 (L) 11/05/2017   HCT 33.4 (L) 11/05/2017   MCV 94.1 11/05/2017   PLT 197 11/05/2017    Radiographic Findings: No results found.  Impression: Clinically stable. Pt seems to have tolerated his radiation therapy well. He  has several other complaints and I recommended that he follow up with his PCP concerning his dizziness and abdominal pain which are unrelated to his recent early-stage lung cancer treatment.  Plan:  Return for follow up in 3 months. Will repeat Chest CT scan soon after that appointment.   ____________________________________   Blair Promise, PhD, MD    This document serves as a record of services personally performed by Gery Pray, MD. It was created on his behalf by [facility], a trained medical scribe. The creation of this record is based on the scribe's personal observations and the provider's statements to them. This document has been checked and approved by the  attending provider.

## 2018-01-14 NOTE — Progress Notes (Signed)
Patient states that he is having pain in his abdominal area  and sometime in his chest. States that he gets dizzy when he stands. States that he gets shortness of breath with activity and at rest. States that he has difficulty with swallowing .States that his appetite  is ok.States that he is coughing up clear phlegm.Patient denies any skin issues. Vitals:   01/14/18 1610  BP: (!) 114/57  Pulse: 66  Temp: 98.3 F (36.8 C)  SpO2: 96%   Wt Readings from Last 3 Encounters:  01/14/18 120 lb 12.8 oz (54.8 kg)  11/22/17 123 lb 4.8 oz (55.9 kg)  11/22/17 123 lb 4.8 oz (55.9 kg)

## 2018-01-21 ENCOUNTER — Other Ambulatory Visit: Payer: Self-pay | Admitting: Family Medicine

## 2018-01-21 DIAGNOSIS — R1013 Epigastric pain: Secondary | ICD-10-CM

## 2018-01-25 ENCOUNTER — Ambulatory Visit
Admission: RE | Admit: 2018-01-25 | Discharge: 2018-01-25 | Disposition: A | Discharge status: Home / Self Care | Payer: Medicare Other | Source: Ambulatory Visit | Attending: Family Medicine | Admitting: Family Medicine

## 2018-01-25 DIAGNOSIS — R1013 Epigastric pain: Secondary | ICD-10-CM

## 2018-01-25 MED ORDER — IOPAMIDOL (ISOVUE-300) INJECTION 61%
100.0000 mL | Freq: Once | INTRAVENOUS | Status: AC | PRN
  Administered 2018-01-25: 100 mL via INTRAVENOUS

## 2018-02-26 ENCOUNTER — Other Ambulatory Visit: Payer: Self-pay | Admitting: Gastroenterology

## 2018-02-26 DIAGNOSIS — R1010 Upper abdominal pain, unspecified: Secondary | ICD-10-CM

## 2018-03-08 ENCOUNTER — Other Ambulatory Visit: Payer: Self-pay | Admitting: Gastroenterology

## 2018-03-08 ENCOUNTER — Ambulatory Visit
Admission: RE | Admit: 2018-03-08 | Discharge: 2018-03-08 | Disposition: A | Discharge status: Home / Self Care | Payer: Medicare Other | Source: Ambulatory Visit | Attending: Gastroenterology | Admitting: Gastroenterology

## 2018-03-08 DIAGNOSIS — R1010 Upper abdominal pain, unspecified: Secondary | ICD-10-CM

## 2018-03-20 ENCOUNTER — Other Ambulatory Visit: Payer: Self-pay

## 2018-03-20 ENCOUNTER — Emergency Department (HOSPITAL_COMMUNITY): Payer: Medicare Other

## 2018-03-20 ENCOUNTER — Encounter (HOSPITAL_COMMUNITY): Payer: Self-pay | Admitting: Emergency Medicine

## 2018-03-20 ENCOUNTER — Inpatient Hospital Stay (HOSPITAL_COMMUNITY)
Admission: EM | Admit: 2018-03-20 | Discharge: 2018-04-07 | DRG: 377 | Disposition: E | Payer: Medicare Other | Attending: Internal Medicine | Admitting: Internal Medicine

## 2018-03-20 DIAGNOSIS — G1229 Other motor neuron disease: Secondary | ICD-10-CM | POA: Diagnosis not present

## 2018-03-20 DIAGNOSIS — K922 Gastrointestinal hemorrhage, unspecified: Secondary | ICD-10-CM | POA: Diagnosis present

## 2018-03-20 DIAGNOSIS — D509 Iron deficiency anemia, unspecified: Secondary | ICD-10-CM | POA: Diagnosis present

## 2018-03-20 DIAGNOSIS — Z87891 Personal history of nicotine dependence: Secondary | ICD-10-CM

## 2018-03-20 DIAGNOSIS — D65 Disseminated intravascular coagulation [defibrination syndrome]: Secondary | ICD-10-CM | POA: Diagnosis present

## 2018-03-20 DIAGNOSIS — Z955 Presence of coronary angioplasty implant and graft: Secondary | ICD-10-CM | POA: Diagnosis not present

## 2018-03-20 DIAGNOSIS — E785 Hyperlipidemia, unspecified: Secondary | ICD-10-CM | POA: Diagnosis present

## 2018-03-20 DIAGNOSIS — K449 Diaphragmatic hernia without obstruction or gangrene: Secondary | ICD-10-CM | POA: Diagnosis present

## 2018-03-20 DIAGNOSIS — E7800 Pure hypercholesterolemia, unspecified: Secondary | ICD-10-CM | POA: Diagnosis present

## 2018-03-20 DIAGNOSIS — R0602 Shortness of breath: Secondary | ICD-10-CM | POA: Diagnosis present

## 2018-03-20 DIAGNOSIS — B3781 Candidal esophagitis: Secondary | ICD-10-CM | POA: Diagnosis present

## 2018-03-20 DIAGNOSIS — I252 Old myocardial infarction: Secondary | ICD-10-CM | POA: Diagnosis not present

## 2018-03-20 DIAGNOSIS — E876 Hypokalemia: Secondary | ICD-10-CM | POA: Diagnosis present

## 2018-03-20 DIAGNOSIS — Z79890 Hormone replacement therapy (postmenopausal): Secondary | ICD-10-CM

## 2018-03-20 DIAGNOSIS — G20 Parkinson's disease: Secondary | ICD-10-CM | POA: Diagnosis present

## 2018-03-20 DIAGNOSIS — I2510 Atherosclerotic heart disease of native coronary artery without angina pectoris: Secondary | ICD-10-CM | POA: Diagnosis present

## 2018-03-20 DIAGNOSIS — J90 Pleural effusion, not elsewhere classified: Secondary | ICD-10-CM

## 2018-03-20 DIAGNOSIS — Z85118 Personal history of other malignant neoplasm of bronchus and lung: Secondary | ICD-10-CM

## 2018-03-20 DIAGNOSIS — K219 Gastro-esophageal reflux disease without esophagitis: Secondary | ICD-10-CM | POA: Diagnosis present

## 2018-03-20 DIAGNOSIS — J449 Chronic obstructive pulmonary disease, unspecified: Secondary | ICD-10-CM | POA: Diagnosis present

## 2018-03-20 DIAGNOSIS — D649 Anemia, unspecified: Secondary | ICD-10-CM | POA: Diagnosis present

## 2018-03-20 DIAGNOSIS — Z923 Personal history of irradiation: Secondary | ICD-10-CM

## 2018-03-20 DIAGNOSIS — Z951 Presence of aortocoronary bypass graft: Secondary | ICD-10-CM

## 2018-03-20 DIAGNOSIS — Z79899 Other long term (current) drug therapy: Secondary | ICD-10-CM | POA: Diagnosis not present

## 2018-03-20 DIAGNOSIS — E43 Unspecified severe protein-calorie malnutrition: Secondary | ICD-10-CM

## 2018-03-20 DIAGNOSIS — I10 Essential (primary) hypertension: Secondary | ICD-10-CM | POA: Diagnosis present

## 2018-03-20 DIAGNOSIS — K2900 Acute gastritis without bleeding: Secondary | ICD-10-CM | POA: Diagnosis present

## 2018-03-20 DIAGNOSIS — E039 Hypothyroidism, unspecified: Secondary | ICD-10-CM | POA: Diagnosis present

## 2018-03-20 DIAGNOSIS — Z7982 Long term (current) use of aspirin: Secondary | ICD-10-CM

## 2018-03-20 DIAGNOSIS — E871 Hypo-osmolality and hyponatremia: Secondary | ICD-10-CM | POA: Diagnosis present

## 2018-03-20 DIAGNOSIS — J690 Pneumonitis due to inhalation of food and vomit: Secondary | ICD-10-CM | POA: Diagnosis present

## 2018-03-20 DIAGNOSIS — D62 Acute posthemorrhagic anemia: Secondary | ICD-10-CM | POA: Diagnosis present

## 2018-03-20 DIAGNOSIS — Z66 Do not resuscitate: Secondary | ICD-10-CM | POA: Diagnosis present

## 2018-03-20 DIAGNOSIS — I959 Hypotension, unspecified: Secondary | ICD-10-CM | POA: Diagnosis not present

## 2018-03-20 DIAGNOSIS — K222 Esophageal obstruction: Secondary | ICD-10-CM | POA: Diagnosis present

## 2018-03-20 DIAGNOSIS — R0902 Hypoxemia: Secondary | ICD-10-CM | POA: Diagnosis present

## 2018-03-20 HISTORY — DX: Anemia, unspecified: D64.9

## 2018-03-20 LAB — CBC WITH DIFFERENTIAL/PLATELET
Abs Immature Granulocytes: 0.04 10*3/uL (ref 0.00–0.07)
BASOS ABS: 0.1 10*3/uL (ref 0.0–0.1)
Basophils Relative: 1 %
EOS ABS: 0.1 10*3/uL (ref 0.0–0.5)
Eosinophils Relative: 2 %
HCT: 21.9 % — ABNORMAL LOW (ref 39.0–52.0)
Hemoglobin: 6.2 g/dL — CL (ref 13.0–17.0)
Immature Granulocytes: 1 %
LYMPHS PCT: 9 %
Lymphs Abs: 0.8 10*3/uL (ref 0.7–4.0)
MCH: 24.2 pg — ABNORMAL LOW (ref 26.0–34.0)
MCHC: 28.3 g/dL — AB (ref 30.0–36.0)
MCV: 85.5 fL (ref 80.0–100.0)
Monocytes Absolute: 1.2 10*3/uL — ABNORMAL HIGH (ref 0.1–1.0)
Monocytes Relative: 15 %
NRBC: 0.2 % (ref 0.0–0.2)
Neutro Abs: 6.1 10*3/uL (ref 1.7–7.7)
Neutrophils Relative %: 72 %
PLATELETS: 410 10*3/uL — AB (ref 150–400)
RBC: 2.56 MIL/uL — AB (ref 4.22–5.81)
RDW: 15.8 % — AB (ref 11.5–15.5)
WBC: 8.3 10*3/uL (ref 4.0–10.5)

## 2018-03-20 LAB — COMPREHENSIVE METABOLIC PANEL
ALBUMIN: 3.1 g/dL — AB (ref 3.5–5.0)
ALT: 9 U/L (ref 0–44)
ANION GAP: 9 (ref 5–15)
AST: 20 U/L (ref 15–41)
Alkaline Phosphatase: 170 U/L — ABNORMAL HIGH (ref 38–126)
BUN: 15 mg/dL (ref 8–23)
CO2: 23 mmol/L (ref 22–32)
Calcium: 8.7 mg/dL — ABNORMAL LOW (ref 8.9–10.3)
Chloride: 105 mmol/L (ref 98–111)
Creatinine, Ser: 0.84 mg/dL (ref 0.61–1.24)
GFR calc Af Amer: >60 mL/min (ref >60)
GFR calc non Af Amer: >60 mL/min (ref >60)
Glucose, Bld: 113 mg/dL — ABNORMAL HIGH (ref 70–99)
POTASSIUM: 3.3 mmol/L — AB (ref 3.5–5.1)
SODIUM: 137 mmol/L (ref 135–145)
TOTAL PROTEIN: 7.3 g/dL (ref 6.5–8.1)
Total Bilirubin: 0.4 mg/dL (ref 0.3–1.2)

## 2018-03-20 LAB — I-STAT TROPONIN, ED: Troponin i, poc: 0 ng/mL (ref 0.00–0.08)

## 2018-03-20 LAB — CBC
HCT: 19.1 % — ABNORMAL LOW (ref 39.0–52.0)
HEMOGLOBIN: 5.6 g/dL — AB (ref 13.0–17.0)
MCH: 24.2 pg — AB (ref 26.0–34.0)
MCHC: 29.3 g/dL — AB (ref 30.0–36.0)
MCV: 82.7 fL (ref 80.0–100.0)
NRBC: 0.4 % — AB (ref 0.0–0.2)
PLATELETS: 362 10*3/uL (ref 150–400)
RBC: 2.31 MIL/uL — AB (ref 4.22–5.81)
RDW: 15.9 % — AB (ref 11.5–15.5)
WBC: 7.2 10*3/uL (ref 4.0–10.5)

## 2018-03-20 LAB — I-STAT VENOUS BLOOD GAS, ED
Acid-Base Excess: 1 mmol/L (ref 0.0–2.0)
BICARBONATE: 25.4 mmol/L (ref 20.0–28.0)
O2 Saturation: 74 %
PO2 VEN: 38 mmHg (ref 32.0–45.0)
TCO2: 27 mmol/L (ref 22–32)
pCO2, Ven: 38.5 mmHg — ABNORMAL LOW (ref 44.0–60.0)
pH, Ven: 7.428 (ref 7.250–7.430)

## 2018-03-20 LAB — PROTIME-INR
INR: 1.13
Prothrombin Time: 14.4 seconds (ref 11.4–15.2)

## 2018-03-20 LAB — POC OCCULT BLOOD, ED: Fecal Occult Bld: POSITIVE — AB

## 2018-03-20 LAB — TSH: TSH: 3.145 u[IU]/mL (ref 0.350–4.500)

## 2018-03-20 LAB — PREPARE RBC (CROSSMATCH)

## 2018-03-20 LAB — APTT: APTT: 30 s (ref 24–36)

## 2018-03-20 MED ORDER — LEVOTHYROXINE SODIUM 50 MCG PO TABS
50.0000 ug | ORAL_TABLET | Freq: Every day | ORAL | Status: DC
  Administered 2018-03-21 – 2018-03-25 (×4): 50 ug via ORAL
  Filled 2018-03-20 (×6): qty 1

## 2018-03-20 MED ORDER — NITROGLYCERIN 0.4 MG SL SUBL: 0.4000 mg | SUBLINGUAL_TABLET | SUBLINGUAL | Status: DC | PRN

## 2018-03-20 MED ORDER — ADULT MULTIVITAMIN W/MINERALS CH
1.0000 | ORAL_TABLET | Freq: Every day | ORAL | Status: DC
  Administered 2018-03-21 – 2018-03-25 (×4): 1 via ORAL
  Filled 2018-03-20 (×5): qty 1

## 2018-03-20 MED ORDER — FERROUS SULFATE 325 (65 FE) MG PO TABS
325.0000 mg | ORAL_TABLET | Freq: Two times a day (BID) | ORAL | Status: DC
  Administered 2018-03-21 – 2018-03-25 (×6): 325 mg via ORAL
  Filled 2018-03-20 (×9): qty 1

## 2018-03-20 MED ORDER — SODIUM CHLORIDE 0.9% IV SOLUTION: Freq: Once | INTRAVENOUS | Status: DC

## 2018-03-20 MED ORDER — ROSUVASTATIN CALCIUM 10 MG PO TABS
10.0000 mg | ORAL_TABLET | Freq: Every day | ORAL | Status: DC
  Administered 2018-03-21 – 2018-03-25 (×4): 10 mg via ORAL
  Filled 2018-03-20 (×5): qty 1

## 2018-03-20 MED ORDER — SODIUM CHLORIDE 0.9 % IV SOLN
INTRAVENOUS | Status: DC
  Administered 2018-03-20 – 2018-03-23 (×3): via INTRAVENOUS

## 2018-03-20 MED ORDER — PANTOPRAZOLE SODIUM 40 MG IV SOLR
40.0000 mg | Freq: Two times a day (BID) | INTRAVENOUS | Status: DC
  Administered 2018-03-20 – 2018-03-22 (×5): 40 mg via INTRAVENOUS
  Filled 2018-03-20 (×5): qty 40

## 2018-03-20 MED ORDER — ACETAMINOPHEN 325 MG PO TABS: 325.0000 mg | ORAL_TABLET | Freq: Four times a day (QID) | ORAL | Status: DC | PRN

## 2018-03-20 NOTE — ED Notes (Signed)
Report called to rn on 5 w 

## 2018-03-20 NOTE — H&P (Signed)
History and Physical   [name] PZW:[id number] DOB: 05/22/1933 DOA: 03/28/2018  Referring MD/NP/PA: Dr. Sedonia Small  PCP: Mayra Neer, MD   Outpatient Specialists: [facility] gastroenterology  Patient coming from: Home  Chief Complaint: Shortness of breath  HPI: Timothy Millin Sr. is a 84 y.o. male with medical history significant of chronic recurrent anemia secondary to previous GI bleed, recurrent abdominal pain, hyperlipidemia, coronary artery disease, hypothyroidism who apparently was at his doctor's office today with complaint of exertional dyspnea, occasional weakness and dizziness, ankle swelling on and off,.  His lab work was done showing a hemoglobin of 6.  Patient denied any recent melena.  Denied any bright red blood per rectum.  Denied any nausea vomiting or diarrhea.  He has had previous EGD and colonoscopy back in 2016 when he had a GI bleed suspected to be due to AVMs and nonsteroidal anti-inflammatory agents.  Patient also recently had barium studies for abdominal pain which apparently was unrevealing.  He follows up with gastroenterology.  He has a history of lung cancer with previous treatment including radiation therapy would have ended in 2019.  Patient seen in the ER and evaluated today.  He has severe COPD which prevented previous attempts at EGD according to family.  We will admit him for work-up of symptomatic anemia.  ED Course: Temperature 97.5 blood pressure 114/52 pulse 74 respiratory rate of 18 oxygen sat 84% room air and 100% on 2 L.  His white count is 8.3 hemoglobin 6.2.  BUN is 15 potassium 3.3 and calcium 8.7.  Patient has been ordered 2 units of packed red blood cells and is being admitted to the hospital.  Stool guaiacs are positive x2  Review of Systems: As per HPI otherwise 10 point review of systems negative.    Past Medical History:  Diagnosis Date  . Anemia   . Anginal pain ([place])   . Complication of anesthesia 1987   "didn't wake up for 5 days"  .  COPD (chronic obstructive pulmonary disease) ([place])   . Coronary artery disease   . Exertional dyspnea 01/04/2012  . GI bleed due to NSAIDs 02/2012  . High cholesterol   . History of blood transfusion 1987; 11/25/2014   "emergent CABG; anemia"  . Hyperlipidemia   . Hypothyroidism   . Myocardial infarction ([place]) 1987  . Numbness and tingling in hands 01/04/2012   "right one goes to sleep on steering wheel when I'm driving; been that way long time; @ least 1 yr"  . Thyroid disease     Past Surgical History:  Procedure Laterality Date  . CARDIAC CATHETERIZATION    . CARDIAC CATHETERIZATION N/A 09/10/2014   Procedure: Left Heart Cath and Cors/Grafts Angiography;  Surgeon: Peter M Martinique, MD;  Location: [facility] CV [facility];  Service: Cardiovascular;  Laterality: N/A;  . CATARACT EXTRACTION W/ INTRAOCULAR LENS  IMPLANT, BILATERAL Bilateral 2012  . COLONOSCOPY N/A 12/04/2014   Procedure: COLONOSCOPY;  Surgeon: Teena Irani, MD;  Location: [facility] ENDOSCOPY;  Service: Endoscopy;  Laterality: N/A;  . COLONOSCOPY W/ POLYPECTOMY  2011   "had to take it out in 3 pieces; all benign"  . CORONARY ANGIOPLASTY WITH STENT PLACEMENT  ? 1990's   "3"  . CORONARY ANGIOPLASTY WITH STENT PLACEMENT  01/04/2012   "2; total of 5"  . CORONARY ARTERY BYPASS GRAFT  1987   CABG X1  . ESOPHAGOGASTRODUODENOSCOPY  02/07/2012   Procedure: ESOPHAGOGASTRODUODENOSCOPY (EGD);  Surgeon: Wonda Horner, MD;  Location: [facility] ENDOSCOPY;  Service: Endoscopy;  Laterality: N/A;  . ESOPHAGOGASTRODUODENOSCOPY (EGD) WITH PROPOFOL Left 11/26/2014   Procedure: ESOPHAGOGASTRODUODENOSCOPY (EGD) WITH PROPOFOL;  Surgeon: Arta Silence, MD;  Location: [facility] ENDOSCOPY;  Service: Endoscopy;  Laterality: Left;  . PERCUTANEOUS CORONARY STENT INTERVENTION (PCI-S) N/A 01/04/2012   Procedure: PERCUTANEOUS CORONARY STENT INTERVENTION (PCI-S);  Surgeon: Sinclair Grooms, MD;  Location: [facility] CATH LAB;  Service: Cardiovascular;  Laterality: N/A;  . WRIST FRACTURE  SURGERY Left ~ 2009  . WRIST HARDWARE REMOVAL Left ~ 2010   "took steel bar out"     reports that he quit smoking about 29 years ago. His smoking use included cigarettes. He has a 40.00 pack-year smoking history. He has never used smokeless tobacco. He reports that he does not drink alcohol or use drugs.  Allergies  Allergen Reactions  . Ibuprofen Other (See Comments)    Internal bleeding  . Morphine And Related Nausea And Vomiting  . Atorvastatin     Muscle pain    Family History  Problem Relation Age of Onset  . Emphysema Father        ? if he smoked or not      Prior to Admission medications   Medication Sig Start Date End Date Taking? Authorizing Provider  acetaminophen (TYLENOL) 325 MG tablet Take 2 tablets (650 mg total) by mouth every 4 (four) hours as needed for headache or mild pain. Patient taking differently: Take 325 mg by mouth every 6 (six) hours as needed for mild pain or headache.  09/10/14   Erlene Quan, PA-C  aspirin 81 MG tablet Take 81 mg by mouth daily.    [provider]  ferrous sulfate 325 (65 FE) MG tablet Take 1 tablet (325 mg total) by mouth 2 (two) times daily with a meal. Patient taking differently: Take 325 mg by mouth daily.  12/05/14   Thurnell Lose, MD  levothyroxine (SYNTHROID, LEVOTHROID) 50 MCG tablet Take 50 mcg by mouth daily before breakfast.  01/30/16   [provider]  Multiple Vitamin (MULTIVITAMIN WITH MINERALS) TABS Take 1 tablet by mouth daily.    [provider]  NITROSTAT 0.4 MG SL tablet PLACE 1 TABLET (0.4 MG TOTAL) UNDER THE TONGUE EVERY 5 (FIVE) MINUTES AS NEEDED. FOR CHEST PAIN 05/18/16   Jerline Pain, MD  pantoprazole (PROTONIX) 40 MG tablet Take 40 mg by mouth daily.    [provider]  rosuvastatin (CRESTOR) 10 MG tablet Take 1 tablet (10 mg total) by mouth daily. 06/27/17   Jerline Pain, MD    Physical Exam: Vitals:   03/24/2018 1754 04/02/2018 1757 03/25/2018 1759  BP:  (!) 114/52     Pulse:  74   Resp:  18   Temp:  (!) 97.5 F (36.4 C)   TempSrc:  Oral   SpO2:  (!) 84% 100%  Weight: 54.4 kg    Height: 5\' 8"  (1.727 m)        Constitutional: NAD, calm, comfortable, chronically ill looking on oxygen Vitals:   04/06/2018 1754 03/31/2018 1757 03/14/2018 1759  BP:  (!) 114/52   Pulse:  74   Resp:  18   Temp:  (!) 97.5 F (36.4 C)   TempSrc:  Oral   SpO2:  (!) 84% 100%  Weight: 54.4 kg    Height: 5\' 8"  (1.727 m)     Eyes: PERRL, lids and conjunctivae normal ENMT: Mucous membranes are moist. Posterior pharynx clear of any exudate or lesions.Normal dentition.  Neck:  normal, supple, no masses, no thyromegaly Respiratory: Widespread rhonchi bilaterally no wheezing, crackles. Normal respiratory effort. No accessory muscle use.  Cardiovascular: Regular rate and rhythm, no murmurs / rubs / gallops. No extremity edema. 2+ pedal pulses. No carotid bruits.  Abdomen: no tenderness, no masses palpated. No hepatosplenomegaly. Bowel sounds positive.  Musculoskeletal: no clubbing / cyanosis. No joint deformity upper and lower extremities. Good ROM, no contractures. Normal muscle tone.  Skin: no rashes, lesions, ulcers. No induration Neurologic: CN 2-12 grossly intact. Sensation intact, DTR normal. Strength 5/5 in all 4.  Psychiatric: Normal judgment and insight. Alert and oriented x 3. Normal mood.     Labs on Admission: I have personally reviewed following labs and imaging studies  CBC: Recent Labs  Lab 03/11/2018 1818  WBC 8.3  NEUTROABS 6.1  HGB 6.2*  HCT 21.9*  MCV 85.5  PLT 295*   Basic Metabolic Panel: Recent Labs  Lab 03/27/2018 1818  NA 137  K 3.3*  CL 105  CO2 23  GLUCOSE 113*  BUN 15  CREATININE 0.84  CALCIUM 8.7*   GFR: Estimated Creatinine Clearance: 52.2 mL/min (by C-G formula based on SCr of 0.84 mg/dL). Liver Function Tests: Recent Labs  Lab 03/29/2018 1818  AST 20  ALT 9  ALKPHOS 170*  BILITOT 0.4  PROT 7.3  ALBUMIN 3.1*   No results  for input(s): LIPASE, AMYLASE in the last 168 hours. No results for input(s): AMMONIA in the last 168 hours. Coagulation Profile: Recent Labs  Lab 03/16/2018 1818  INR 1.13   Cardiac Enzymes: No results for input(s): CKTOTAL, CKMB, CKMBINDEX, TROPONINI in the last 168 hours. BNP (last 3 results) No results for input(s): PROBNP in the last 8760 hours. HbA1C: No results for input(s): HGBA1C in the last 72 hours. CBG: No results for input(s): GLUCAP in the last 168 hours. Lipid Profile: No results for input(s): CHOL, HDL, LDLCALC, TRIG, CHOLHDL, LDLDIRECT in the last 72 hours. Thyroid Function Tests: No results for input(s): TSH, T4TOTAL, FREET4, T3FREE, THYROIDAB in the last 72 hours. Anemia Panel: No results for input(s): VITAMINB12, FOLATE, FERRITIN, TIBC, IRON, RETICCTPCT in the last 72 hours. Urine analysis: No results found for: COLORURINE, APPEARANCEUR, LABSPEC, PHURINE, GLUCOSEU, HGBUR, BILIRUBINUR, KETONESUR, PROTEINUR, UROBILINOGEN, NITRITE, LEUKOCYTESUR Sepsis Labs: @LABRCNTIP (procalcitonin:4,lacticidven:4) )No results found for this or any previous visit (from the past 240 hour(s)).   Radiological Exams on Admission: Dg Chest Port 1 View  Result Date: 03/19/2018 CLINICAL DATA:  Initial evaluation for shortness of breath. History of COPD. EXAM: PORTABLE CHEST 1 VIEW COMPARISON:  Prior radiograph from 11/05/2017. FINDINGS: Median sternotomy wires noted. Underlying cardiomegaly, unchanged. Coronary stent noted. Mediastinal silhouette normal. Aortic atherosclerosis. Lungs hypoinflated. Underlying COPD with irregular biapical pleuroparenchymal scarring, right greater than left. Small layering bilateral pleural effusions, left greater than right. Associated bibasilar opacities likely atelectasis, although infiltrates could be considered in the correct clinical setting. No overt pulmonary edema. No pneumothorax. No acute osseous abnormality. IMPRESSION: 1. Small bilateral pleural  effusions, left greater than right. Associated bibasilar opacities likely reflect atelectasis, although infiltrates could be considered in the correct clinical setting. 2. Cardiomegaly with underlying COPD. 3. Aortic atherosclerosis. Electronically Signed   By: Jeannine Boga M.D.   On: 04/04/2018 18:48    EKG: Independently reviewed. It shows sinus rhythm with a rate of 72, no significant ST changes  Assessment/Plan Principal Problem:   Symptomatic anemia Active Problems:   SOB (shortness of breath)   Hypothyroidism   Hypokalemia     #1 symptomatic  anemia: Most likely due to slow GI bleed.  Patient is guaiac positive x2.  Will transfuse 2 units of packed red blood cells.  Admit the patient and serial CBCs with IV Protonix, GI consult and follow-up.  Patient may require a EGD colonoscopy but if not possible at least the capsule endoscopy.  He had a previous AVM years ago that was cauterized.  #2 hypothyroidism: Continue with levothyroxine.  #3 COPD: No exacerbation.  Continue home regimen with oxygen.  #4 hypokalemia: Potassium is low.  Replete potassium.  #5 bilateral pleural effusions: Mild probably related to symptomatic anemia continue to monitor after transfusion   DVT prophylaxis: SCD Code Status: Full Family Communication: Wife and kids at bedside Disposition Plan: To be determined Consults called: [facility] called by ER Admission status: Inpatient  Severity of Illness: The appropriate patient status for this patient is INPATIENT. Inpatient status is judged to be reasonable and necessary in order to provide the required intensity of service to ensure the patient's safety. The patient's presenting symptoms, physical exam findings, and initial radiographic and laboratory data in the context of their chronic comorbidities is felt to place them at high risk for further clinical deterioration. Furthermore, it is not anticipated that the patient will be medically  stable for discharge from the hospital within 2 midnights of admission. The following factors support the patient status of inpatient.   " The patient's presenting symptoms include exertional dyspnea and dizziness. " The worrisome physical exam findings include mild pallor was mild edema. " The initial radiographic and laboratory data are worrisome because of bilateral pleural effusion also noted on chest x-ray. " The chronic co-morbidities include hypertension as well as previous GI.   * I certify that at the point of admission it is my clinical judgment that the patient will require inpatient hospital care spanning beyond 2 midnights from the point of admission due to high intensity of service, high risk for further deterioration and high frequency of surveillance required.Barbette Merino MD [facility] Pager [phone]  If 7PM-7AM, please contact night-coverage [email or web address] Password [email or web address]  03/27/2018, 9:22 PM

## 2018-03-20 NOTE — ED Provider Notes (Signed)
[facility] EMERGENCY DEPARTMENT Provider Note   CSN: [id number] Arrival date & time: 03/08/2018  1747     History   Chief Complaint Chief Complaint  Patient presents with  . Abnormal Lab  . Abdominal Pain    HPI [name] Sr. is a 84 y.o. male presenting with low blood counts found at his doctor's office.  Patient states he was at his doctor's office today, it was noted that his blood counts were low and he was hypoxic.  Patient states the past several months, he has been feeling lightheaded, short of breath, tired, and weak.  He has been having abdominal pain, which has been worked up by his GI doctor at [facility] without a known cause.  Patient states he has been coughing past several days, it is productive.  He denies fevers, chills, chest pain, abnormal urination, or abnormal bowel movements.  He denies vomiting or hematemesis.  He denies black stools or hematochezia.  He is not on blood thinners.  Patient does have a history of GI bleeds, that was never found exactly where it came from.  Additional history of COPD, CAD, hypothyroidism, and lung cancer.  Patient states he was recently told that his lung cancer is gone with the treatment.  Additional history obtained from chart review, patient's last radiation treatment was December 13, 2017, no chest imaging or PET scans in our EMR since completion of radiation. Ct ab form 9/20 shoed constipation.  HPI  Past Medical History:  Diagnosis Date  . Anemia   . Anginal pain ([place])   . Complication of anesthesia 1987   "didn't wake up for 5 days"  . COPD (chronic obstructive pulmonary disease) ([place])   . Coronary artery disease   . Exertional dyspnea 01/04/2012  . GI bleed due to NSAIDs 02/2012  . High cholesterol   . History of blood transfusion 1987; 11/25/2014   "emergent CABG; anemia"  . Hyperlipidemia   . Hypothyroidism   . Myocardial infarction ([place]) 1987  . Numbness and tingling in hands 01/04/2012   "right  one goes to sleep on steering wheel when I'm driving; been that way long time; @ least 1 yr"  . Thyroid disease     Patient Active Problem List   Diagnosis Date Noted  . Symptomatic anemia 03/29/2018  . Hypokalemia 03/19/2018  . Primary malignant neoplasm of left lower lobe of lung ([place]) 11/22/2017  . Pulmonary nodule 10/24/2017  . ILD (interstitial lung disease) ([place]) 05/07/2017  . Lower GI bleed 12/03/2014  . LGI bleed 12/03/2014  . S/P CABG 1987 09/10/2014  . CAD -S/P LAD BMS '05, LAD DES 2013, cath [facility] 09/10/14 09/10/2014  . COPD (chronic obstructive pulmonary disease) ([place]) 09/10/2014  . Dyslipidemia 09/10/2014  . Chest pain with moderate risk of acute coronary syndrome 09/10/2014  . H/O of GI bleed secondary to NSAIDs and DAPT 09/10/2014  . Chest pain 09/09/2014  . Hypothyroidism 11/15/2013  . SOB (shortness of breath) 11/14/2013  . Orthostatic hypotension 02/09/2012  . Anemia associated with acute blood loss 02/07/2012  . GI bleed 02/06/2012  . Melena 02/06/2012  . Hx of CABG 02/06/2012  . S/P colonoscopic polypectomy 02/06/2012  . Hyperlipidemia   . COPD GOLD II   . CAD     Past Surgical History:  Procedure Laterality Date  . CARDIAC CATHETERIZATION    . CARDIAC CATHETERIZATION N/A 09/10/2014   Procedure: Left Heart Cath and Cors/Grafts Angiography;  Surgeon: Peter M Martinique, MD;  Location:  [facility] INVASIVE CV LAB CUPID;  Service: Cardiovascular;  Laterality: N/A;  . CATARACT EXTRACTION W/ INTRAOCULAR LENS  IMPLANT, BILATERAL Bilateral 2012  . COLONOSCOPY N/A 12/04/2014   Procedure: COLONOSCOPY;  Surgeon: Teena Irani, MD;  Location: [facility] ENDOSCOPY;  Service: Endoscopy;  Laterality: N/A;  . COLONOSCOPY W/ POLYPECTOMY  2011   "had to take it out in 3 pieces; all benign"  . CORONARY ANGIOPLASTY WITH STENT PLACEMENT  ? 1990's   "3"  . CORONARY ANGIOPLASTY WITH STENT PLACEMENT  01/04/2012   "2; total of 5"  . CORONARY ARTERY BYPASS GRAFT  1987   CABG X1  .  ESOPHAGOGASTRODUODENOSCOPY  02/07/2012   Procedure: ESOPHAGOGASTRODUODENOSCOPY (EGD);  Surgeon: Wonda Horner, MD;  Location: [facility] ENDOSCOPY;  Service: Endoscopy;  Laterality: N/A;  . ESOPHAGOGASTRODUODENOSCOPY (EGD) WITH PROPOFOL Left 11/26/2014   Procedure: ESOPHAGOGASTRODUODENOSCOPY (EGD) WITH PROPOFOL;  Surgeon: Arta Silence, MD;  Location[facility] END[facility]COPY;  Service: Endoscopy;  Laterality: Left;  . PERCUTANEOUS CORONARY STENT INTERVENTION (PCI-S) N/A 01/04/2012   Procedure: PERCUTANEOUS CORONARY STENT INTERVENTION (PCI-S);  Surgeon: Sinclair Grooms, MD;  Location[facility] CATH LAB;  Service: Cardiovascular;  Laterality: N/A;  . WRIST FRACTURE SURGERY Left ~ 2009  . WRIST HARDWARE REMOVAL Left ~ 2010   "took steel bar out"        Home Medications    Prior to Admission medications   Medication Sig Start Date End Date Taking? Authorizing Provider  aspirin 81 MG tablet Take 81 mg by mouth daily.   Yes [provider]  levothyroxine (SYNTHROID, LEVOTHROID) 50 MCG tablet Take 50 mcg by mouth daily before breakfast.  01/30/16  Yes [provider]  Multiple Vitamin (MULTIVITAMIN WITH MINERALS) TABS Take 1 tablet by mouth daily.   Yes [provider]  NITROSTAT 0.4 MG SL tablet PLACE 1 TABLET (0.4 MG TOTAL) UNDER THE TONGUE EVERY 5 (FIVE) MINUTES AS NEEDED. FOR CHEST PAIN 05/18/16  Yes Jerline Pain, MD  pantoprazole (PROTONIX) 40 MG tablet Take 40 mg by mouth daily.   Yes [provider]  PROAIR HFA 108 (90 Base) MCG/ACT inhaler Inhale 2 puffs into the lungs every 4 (four) hours as needed. 02/21/18  Yes [provider]  rosuvastatin (CRESTOR) 10 MG tablet Take 1 tablet (10 mg total) by mouth daily. 06/27/17  Yes Jerline Pain, MD  sucralfate (CARAFATE) 1 g tablet Take 1 g by mouth 2 (two) times daily. 03/12/18  Yes [provider]  acetaminophen (TYLENOL) 325 MG tablet Take 2 tablets (650 mg total) by mouth every 4 (four) hours as needed for headache  or mild pain. Patient not taking: Reported on 03/13/2018 09/10/14   Erlene Quan, PA-C  ferrous sulfate 325 (65 FE) MG tablet Take 1 tablet (325 mg total) by mouth 2 (two) times daily with a meal. Patient not taking: Reported on 04/03/2018 12/05/14   Thurnell Lose, MD    Family History Family History  Problem Relation Age of Onset  . Emphysema Father        ? if he smoked or not     Social History Social History   Tobacco Use  . Smoking status: Former Smoker    Packs/day: 1.00    Years: 40.00    Pack years: 40.00    Types: Cigarettes    Last attempt to quit: 05/08/1988    Years since quitting: 29.8  . Smokeless tobacco: Never Used  Substance Use Topics  . Alcohol use: No  Comment: 11/25/2014 "hadn't drank any alcohol in the 2000's"  . Drug use: No     Allergies   Ibuprofen; Morphine and related; and Atorvastatin   Review of Systems Review of Systems  Constitutional: Positive for fatigue.  Respiratory: Positive for cough and shortness of breath.   Gastrointestinal: Positive for abdominal pain.  Neurological: Positive for dizziness, weakness and light-headedness.  All other systems reviewed and are negative.    Physical Exam Updated Vital Signs BP (!) 114/52 (BP Location: Right Arm)   Pulse 74   Temp (!) 97.5 F (36.4 C) (Oral)   Resp 18   Ht 5\' 8"  (1.727 m)   Wt 54.4 kg   SpO2 100%   BMI 18.25 kg/m   Physical Exam  Constitutional: He is oriented to person, place, and time.  Cachectic, elderly male who appears pale.  HENT:  Head: Normocephalic and atraumatic.  MM dry  Eyes: Pupils are equal, round, and reactive to light. Conjunctivae and EOM are normal.  Neck: Normal range of motion. Neck supple.  Cardiovascular: Normal rate, regular rhythm and intact distal pulses.  Pulmonary/Chest: Effort normal and breath sounds normal. No respiratory distress. He has no wheezes.  Speaking in full sentences on O2 via [place].  Unable to take deep breaths without  having coughing fit.  No obvious lung sounds  Abdominal: Soft. He exhibits no distension and no mass. There is tenderness. There is no rebound and no guarding.  Generalized tenderness.  No rigidity, guarding, distention.  Musculoskeletal: Normal range of motion.  No leg pain or swelling  Neurological: He is alert and oriented to person, place, and time.  Skin: Skin is warm and dry. Capillary refill takes less than 2 seconds. There is pallor.  Psychiatric: He has a normal mood and affect.  Nursing note and vitals reviewed.    ED Treatments / Results  Labs (all labs ordered are listed, but only abnormal results are displayed) Labs Reviewed  CBC WITH DIFFERENTIAL/PLATELET - Abnormal; Notable for the following components:      Result Value   RBC 2.56 (*)    Hemoglobin 6.2 (*)    HCT 21.9 (*)    MCH 24.2 (*)    MCHC 28.3 (*)    RDW 15.8 (*)    Platelets 410 (*)    Monocytes Absolute 1.2 (*)    All other components within normal limits  COMPREHENSIVE METABOLIC PANEL - Abnormal; Notable for the following components:   Potassium 3.3 (*)    Glucose, Bld 113 (*)    Calcium 8.7 (*)    Albumin 3.1 (*)    Alkaline Phosphatase 170 (*)    All other components within normal limits  I-STAT VENOUS BLOOD GAS, ED - Abnormal; Notable for the following components:   pCO2, Ven 38.5 (*)    All other components within normal limits  POC OCCULT BLOOD, ED - Abnormal; Notable for the following components:   Fecal Occult Bld POSITIVE (*)    All other components within normal limits  PROTIME-INR  APTT  I-STAT TROPONIN, ED  TYPE AND SCREEN  PREPARE RBC (CROSSMATCH)    EKG EKG Interpretation  Date/Time:  Wednesday March 20 2018 18:37:55 EST Ventricular Rate:  72 PR Interval:    QRS Duration: 86 QT Interval:  524 QTC Calculation: 574 R Axis:   43 Text Interpretation:  Sinus rhythm Nonspecific repol abnormality, diffuse leads Prolonged QT interval Baseline wander in lead(s) V3 non specific  t wave changes to the inferior leads  Otherwise no significant change Confirmed by Deno Etienne [phone]) on 04/06/2018 6:54:45 PM   Radiology Dg Chest Port 1 View  Result Date: 03/19/2018 CLINICAL DATA:  Initial evaluation for shortness of breath. History of COPD. EXAM: PORTABLE CHEST 1 VIEW COMPARISON:  Prior radiograph from 11/05/2017. FINDINGS: Median sternotomy wires noted. Underlying cardiomegaly, unchanged. Coronary stent noted. Mediastinal silhouette normal. Aortic atherosclerosis. Lungs hypoinflated. Underlying COPD with irregular biapical pleuroparenchymal scarring, right greater than left. Small layering bilateral pleural effusions, left greater than right. Associated bibasilar opacities likely atelectasis, although infiltrates could be considered in the correct clinical setting. No overt pulmonary edema. No pneumothorax. No acute osseous abnormality. IMPRESSION: 1. Small bilateral pleural effusions, left greater than right. Associated bibasilar opacities likely reflect atelectasis, although infiltrates could be considered in the correct clinical setting. 2. Cardiomegaly with underlying COPD. 3. Aortic atherosclerosis. Electronically Signed   By: Jeannine Boga M.D.   On: 04/06/2018 18:48    Procedures .Critical Care Performed by: Franchot Heidelberg, PA-C Authorized by: Franchot Heidelberg, PA-C   Critical care provider statement:    Critical care time (minutes):  40   Critical care time was exclusive of:  Separately billable procedures and treating other patients and teaching time   Critical care was necessary to treat or prevent imminent or life-threatening deterioration of the following conditions:  Circulatory failure   Critical care was time spent personally by me on the following activities:  Blood draw for specimens, development of treatment plan with patient or surrogate, discussions with consultants, evaluation of patient's response to treatment, examination of patient, obtaining  history from patient or surrogate, ordering and performing treatments and interventions, ordering and review of laboratory studies, ordering and review of radiographic studies, re-evaluation of patient's condition, review of old charts and pulse oximetry   I assumed direction of critical care for this patient from another provider in my specialty: no   Comments:     Patient with critically low hemoglobin.  Blood transfusion started.   (including critical care time)  Medications Ordered in ED Medications  0.9 %  sodium chloride infusion (Manually program via Guardrails IV Fluids) (has no administration in time range)     Initial Impression / Assessment and Plan / ED Course  I have reviewed the triage vital signs and the nursing notes.  Pertinent labs & imaging results that were available during my care of the patient were reviewed by me and considered in my medical decision making (see chart for details).     Patient presenting for evaluation of shortness of breath, low hemoglobin levels, and weakness.  Physical exam concerning, elderly male who appears pale.  Initially hypoxic in the low 80s, this improved with oxygen.  Patient with concerning history including lung cancer and severe COPD.  History of GI bleeds.  Will obtain labs, chest x-ray, EKG, and reassess.  Labs with critically low hemoglobin at 6.2.  Blood transfusion ordered.  Chest x-ray shows bilateral effusion versus pneumonia versus atelectasis.  Fever effusion at this time, considering patient's history of lung cancer and lack of fever.  EKG with nonspecific changes, no STEMI.  Troponin negative.  Labs without leukocytosis.  Will call for admission for anemia and hypoxia.  Discussed with Dr. Mcarthur Rossetti from [facility] hospitalist service, patient be admitted.  Requesting consult with [facility], as patient may need further work-up to rule out GI bleed.  Discussed with Dr. Cristina Gong from [facility], who requests Hemoccult and states his team  will round on the patient tomorrow.  Hemoccult positive without gross blood.   Final Clinical Impressions(s) / ED Diagnoses   Final diagnoses:  SOB (shortness of breath)  Symptomatic anemia  Bilateral pleural effusion    ED Discharge Orders    None       Franchot Heidelberg, PA-C 04/04/2018 2119    Deno Etienne, DO 03/12/2018 2306

## 2018-03-20 NOTE — ED Triage Notes (Signed)
Daughter stated, He was sent here from Dr. Brigitte Pulse and needs Blood stat. His Hgb.5.1, znd he is not to be sent home.

## 2018-03-21 ENCOUNTER — Encounter (HOSPITAL_COMMUNITY): Payer: Self-pay | Admitting: General Practice

## 2018-03-21 ENCOUNTER — Inpatient Hospital Stay (HOSPITAL_COMMUNITY): Payer: Medicare Other

## 2018-03-21 ENCOUNTER — Other Ambulatory Visit: Payer: Self-pay

## 2018-03-21 LAB — HEMOGLOBIN AND HEMATOCRIT, BLOOD
HCT: 28.4 % — ABNORMAL LOW (ref 39.0–52.0)
HEMATOCRIT: 28.9 % — AB (ref 39.0–52.0)
Hemoglobin: 9 g/dL — ABNORMAL LOW (ref 13.0–17.0)
Hemoglobin: 9.1 g/dL — ABNORMAL LOW (ref 13.0–17.0)

## 2018-03-21 LAB — BASIC METABOLIC PANEL
Anion gap: 7 (ref 5–15)
BUN: 9 mg/dL (ref 8–23)
CO2: 23 mmol/L (ref 22–32)
Calcium: 8.4 mg/dL — ABNORMAL LOW (ref 8.9–10.3)
Chloride: 108 mmol/L (ref 98–111)
Creatinine, Ser: 0.85 mg/dL (ref 0.61–1.24)
GFR calc Af Amer: >60 mL/min (ref >60)
Glucose, Bld: 138 mg/dL — ABNORMAL HIGH (ref 70–99)
POTASSIUM: 3.3 mmol/L — AB (ref 3.5–5.1)
SODIUM: 138 mmol/L (ref 135–145)

## 2018-03-21 LAB — GLUCOSE, CAPILLARY: GLUCOSE-CAPILLARY: 112 mg/dL — AB (ref 70–99)

## 2018-03-21 MED ORDER — POTASSIUM CHLORIDE CRYS ER 20 MEQ PO TBCR
40.0000 meq | EXTENDED_RELEASE_TABLET | Freq: Once | ORAL | Status: AC
  Administered 2018-03-21: 40 meq via ORAL
  Filled 2018-03-21: qty 2

## 2018-03-21 MED ORDER — ACETAMINOPHEN 325 MG PO TABS
650.0000 mg | ORAL_TABLET | ORAL | Status: DC | PRN
  Filled 2018-03-21: qty 2

## 2018-03-21 MED ORDER — GUAIFENESIN-DM 100-10 MG/5ML PO SYRP
5.0000 mL | ORAL_SOLUTION | ORAL | Status: DC | PRN
  Administered 2018-03-21 (×2): 5 mL via ORAL
  Filled 2018-03-21 (×3): qty 5

## 2018-03-21 MED ORDER — FAMOTIDINE 20 MG PO TABS
20.0000 mg | ORAL_TABLET | Freq: Two times a day (BID) | ORAL | Status: DC
  Administered 2018-03-21 – 2018-03-25 (×7): 20 mg via ORAL
  Filled 2018-03-21 (×8): qty 1

## 2018-03-21 MED ORDER — FUROSEMIDE 10 MG/ML IJ SOLN
20.0000 mg | Freq: Once | INTRAMUSCULAR | Status: AC
  Administered 2018-03-21: 20 mg via INTRAVENOUS
  Filled 2018-03-21: qty 2

## 2018-03-21 MED ORDER — IPRATROPIUM-ALBUTEROL 0.5-2.5 (3) MG/3ML IN SOLN
3.0000 mL | Freq: Four times a day (QID) | RESPIRATORY_TRACT | Status: DC | PRN
  Administered 2018-03-22 – 2018-03-24 (×2): 3 mL via RESPIRATORY_TRACT
  Filled 2018-03-21 (×2): qty 3

## 2018-03-21 MED ORDER — SUCRALFATE 1 G PO TABS
1.0000 g | ORAL_TABLET | Freq: Two times a day (BID) | ORAL | Status: DC
  Administered 2018-03-21 – 2018-03-25 (×7): 1 g via ORAL
  Filled 2018-03-21 (×9): qty 1

## 2018-03-21 MED ORDER — FERROUS SULFATE 325 (65 FE) MG PO TABS: 325.0000 mg | ORAL_TABLET | Freq: Two times a day (BID) | ORAL | Status: DC

## 2018-03-21 MED ORDER — BOOST / RESOURCE BREEZE PO LIQD CUSTOM
1.0000 | Freq: Three times a day (TID) | ORAL | Status: DC
  Administered 2018-03-21 – 2018-03-23 (×4): 1 via ORAL
  Administered 2018-03-24: 11:00:00 via ORAL

## 2018-03-21 NOTE — Progress Notes (Signed)
PROGRESS NOTE                                                                                                                                                                                                             Patient Demographics:    [name], is a 84 y.o. male, DOB - 1934-03-21, TDD:[id number]  Admit date - 04/04/2018   Admitting Physician Elwyn Reach, MD  Outpatient Primary MD for the patient is Mayra Neer, MD  LOS - 1  Chief Complaint  Patient presents with  . Abnormal Lab  . Abdominal Pain       Brief Narrative  Timothy HANTON Sr. is a 84 y.o. male with medical history significant of chronic recurrent anemia secondary to previous GI bleed, recurrent abdominal pain, hyperlipidemia, coronary artery disease, hypothyroidism who apparently was at his doctor's office today with complaint of exertional dyspnea, occasional weakness and dizziness, ankle swelling on and off,.  His lab work was done showing a hemoglobin of 6.  Patient denied any recent melena.  Denied any bright red blood per rectum. He did have epigastric pain and has H/O AVM bleeds in the past.   Subjective:    [name] today has, No headache, No chest pain, +ve epigastric abdominal pain - No Nausea, No new weakness tingling or numbness, No Cough - SOB.     Assessment  & Plan :      1.Symptomatic acute on chronic iron deficiency anemia due to blood loss related anemia with epigastric pain and history of AVM bleeds in the past.  Likely occurred subacute upper GI bleed, currently on IV PPI twice daily, 2 units of packed RBCs given on 03/21/2018, monitor H&H, clear liquid diet, GI on board likely will require EGD plus minus colonoscopy.  Hold aspirin which he takes at home.  Continue oral iron supplementation.  2.  COPD.  Moderate at best.  Was not on home oxygen until his anemia got worse in the last few days and he was getting hypoxic on ambulation requiring order of home oxygen which  was placed by PCP 2 days ago however I think this is more to do with anemia than his COPD.  On exam he has minimal to no wheezing and moderate to good air movement bilaterally.  Is comfortable in bed, continue oxygen as needed and nebulizer treatments as needed.  3.  Hypothyroidism.  On Synthroid continue.  4.  Hypokalemia.  Replaced.  5.  History of iron deficiency  anemia.  Has a #1 above.    Family Communication  :  Daughter and wife  Code Status :  Full  Disposition Plan  :  Tele  Consults  :  GI  Procedures  :      DVT Prophylaxis  :   SCDs   Lab Results  Component Value Date   PLT 362 03/08/2018    Diet :  Diet Order            Diet clear liquid Room service appropriate? Yes; Fluid consistency: Thin  Diet effective now               Inpatient Medications Scheduled Meds: . famotidine  20 mg Oral BID  . ferrous sulfate  325 mg Oral BID WC  . furosemide  20 mg Intravenous Once  . levothyroxine  50 mcg Oral Q0600  . multivitamin with minerals  1 tablet Oral Daily  . pantoprazole (PROTONIX) IV  40 mg Intravenous Q12H  . rosuvastatin  10 mg Oral Daily   Continuous Infusions: . sodium chloride 50 mL/hr at 03/21/18 0841   PRN Meds:.acetaminophen, guaiFENesin-dextromethorphan, nitroGLYCERIN  Antibiotics  :   Anti-infectives (From admission, onward)   None          Objective:   Vitals:   03/21/18 0200 03/21/18 0416 03/21/18 0445 03/21/18 0800  BP: 114/60 (!) 118/57 121/60 116/63  Pulse: 66 73 79 88  Resp: 18 18 17 18   Temp: (!) 97.3 F (36.3 C) 98.3 F (36.8 C) 98.8 F (37.1 C) 98.4 F (36.9 C)  TempSrc: Oral Oral Oral Oral  SpO2: 98% 95% 93% (!) 89%  Weight:      Height:        Wt Readings from Last 3 Encounters:  03/08/2018 54.4 kg  01/14/18 54.8 kg  11/22/17 55.9 kg     Intake/Output Summary (Last 24 hours) at 03/21/2018 0942 Last data filed at 03/21/2018 0855 Gross per 24 hour  Intake 804.09 ml  Output 400 ml  Net 404.09 ml      Physical Exam  Awake Alert, Oriented X 3, No new F.N deficits, Normal affect [place].AT,PERRAL Supple Neck,No JVD, No cervical lymphadenopathy appriciated.  Symmetrical Chest wall movement, Good air movement bilaterally, CTAB RRR,No Gallops,Rubs or new Murmurs, No Parasternal Heave +ve B.Sounds, Abd Soft, +ve epigastric tenderness, No organomegaly appriciated, No rebound - guarding or rigidity. No Cyanosis, Clubbing or edema, No new Rash or bruise      Data Review:    CBC Recent Labs  Lab 03/23/2018 1818 03/30/2018 2217  WBC 8.3 7.2  HGB 6.2* 5.6*  HCT 21.9* 19.1*  PLT 410* 362  MCV 85.5 82.7  MCH 24.2* 24.2*  MCHC 28.3* 29.3*  RDW 15.8* 15.9*  LYMPHSABS 0.8  --   MONOABS 1.2*  --   EOSABS 0.1  --   BASOSABS 0.1  --     Chemistries  Recent Labs  Lab 03/09/2018 1818  NA 137  K 3.3*  CL 105  CO2 23  GLUCOSE 113*  BUN 15  CREATININE 0.84  CALCIUM 8.7*  AST 20  ALT 9  ALKPHOS 170*  BILITOT 0.4   ------------------------------------------------------------------------------------------------------------------ No results for input(s): CHOL, HDL, LDLCALC, TRIG, CHOLHDL, LDLDIRECT in the last 72 hours.  Lab Results  Component Value Date   HGBA1C 5.5 09/09/2014   ------------------------------------------------------------------------------------------------------------------ Recent Labs    03/25/2018 2217  TSH 3.145   ------------------------------------------------------------------------------------------------------------------ No results for input(s): VITAMINB12, FOLATE, FERRITIN, TIBC, IRON, RETICCTPCT in the  last 72 hours.  Coagulation profile Recent Labs  Lab 03/21/2018 1818  INR 1.13    No results for input(s): DDIMER in the last 72 hours.  Cardiac Enzymes No results for input(s): CKMB, TROPONINI, MYOGLOBIN in the last 168 hours.  Invalid input(s):  CK ------------------------------------------------------------------------------------------------------------------    Component Value Date/Time   BNP 141.8 (H) 09/09/2014 2022    Micro Results No results found for this or any previous visit (from the past 240 hour(s)).  Radiology Reports Dg Chest Port 1 View  Result Date: 03/12/2018 CLINICAL DATA:  Initial evaluation for shortness of breath. History of COPD. EXAM: PORTABLE CHEST 1 VIEW COMPARISON:  Prior radiograph from 11/05/2017. FINDINGS: Median sternotomy wires noted. Underlying cardiomegaly, unchanged. Coronary stent noted. Mediastinal silhouette normal. Aortic atherosclerosis. Lungs hypoinflated. Underlying COPD with irregular biapical pleuroparenchymal scarring, right greater than left. Small layering bilateral pleural effusions, left greater than right. Associated bibasilar opacities likely atelectasis, although infiltrates could be considered in the correct clinical setting. No overt pulmonary edema. No pneumothorax. No acute osseous abnormality. IMPRESSION: 1. Small bilateral pleural effusions, left greater than right. Associated bibasilar opacities likely reflect atelectasis, although infiltrates could be considered in the correct clinical setting. 2. Cardiomegaly with underlying COPD. 3. Aortic atherosclerosis. Electronically Signed   By: Jeannine Boga M.D.   On: 04/04/2018 18:48   Dg Duanne Limerick W/small Bowel  Result Date: 03/08/2018 CLINICAL DATA:  Generalized abdominal pain. History of COPD and biopsy-proven left lower lobe lung cancer. EXAM: UPPER GI SERIES WITH SMALL BOWEL FOLLOW-THROUGH FLUOROSCOPY TIME:  Fluoroscopy Time:  3 minutes 12 seconds Radiation Exposure Index (if provided by the fluoroscopic device): 217 mGy Number of Acquired Spot Images: 14 TECHNIQUE: Combined double contrast and single contrast upper GI series using effervescent crystals, thick barium, and thin barium. Subsequently, serial images of the small bowel  were obtained including spot views of the terminal ileum. COMPARISON:  01/25/2018 CT abdomen.  10/17/2017 PET-CT. FINDINGS: Scout radiograph demonstrates no dilated small bowel loops. Moderate colonic stool. No evidence of pneumatosis or pneumoperitoneum. No radiopaque nephrolithiasis. Evaluation is significantly limited by patient's mobility restrictions. Specifically, evaluation was limited to supine and anterior oblique imaging (patient unable to lie prone or stand for significant period of time). Esophageal motility is grossly normal. No evidence of hiatal hernia. Mild gastroesophageal reflux was elicited to the level of lower thoracic esophagus with water siphon test. Normal esophageal distensibility, with no evidence of esophageal mass, stricture or ulcer. No evidence of gastric fold thickening, ulcers or filling defects. Normal gastric emptying. Normal duodenal sweep with no evidence of duodenal fold thickening, strictures, ulcers or filling defects. Normal duodenal jejunal junction to the left of the spine. There is normal small bowel transit time of 50 minutes. Normal caliber of the small bowel loops with no small bowel fold thickening, focal caliber changes or fixed positioning. No small bowel filling defects or fistulous. Terminal ileum appears normal. IMPRESSION: 1. Evaluation limited by patient mobility restrictions, see comments. 2. Mild gastroesophageal reflux elicited.  No hiatal hernia. 3. Otherwise unremarkable limited upper GI and small-bowel follow-through. Normal esophageal, gastric and small bowel motility. No evidence of peptic ulcer disease. No evidence of small-bowel obstruction. Electronically Signed   By: Ilona Sorrel M.D.   On: 03/08/2018 10:57    Time Spent in minutes  30   Lala Lund M.D on 03/21/2018 at 9:42 AM  To page go to [email or web address] - password [email or web address]

## 2018-03-21 NOTE — Progress Notes (Signed)
Patient arrived to the unit via bed from the emergency department.  Patient is alert and oriented.  No complaints of pain.  Skin assessment complete.  No skin issues.  Educated the patient and family on how to reach the staff on the unit.  Lowered the bed, activated the bed alarm and placed the call light within reach.  Will continue to monitor the patient and notify MD as needed

## 2018-03-21 NOTE — Progress Notes (Signed)
SATURATION QUALIFICATIONS: (This note is used to comply with regulatory documentation for home oxygen)  Patient Saturations on Room Air at Rest = 83%  Patient Saturations on Room Air while Ambulating = 83-85%  Patient Saturations on 3 Liters of oxygen at Rest = 90%  Patient Saturations on  Liters of oxygen while Ambulating =   Please briefly explain why patient needs home oxygen: Pt requires O2 at rest. O2 sats were below 88% at rest and dropped to 83% with activity. Pt required time to increase sats to >88% after O2 applied.

## 2018-03-21 NOTE — Anesthesia Preprocedure Evaluation (Addendum)
Anesthesia Evaluation  Patient identified by MRN, date of birth, ID bandGeneral Assessment Comment:Responds to stimulation  Reviewed: Allergy & Precautions, NPO status , Patient's Chart, lab work & pertinent test results  Airway Mallampati: II  TM Distance: >3 FB Neck ROM: Full    Dental  (+) Missing, Poor Dentition   Pulmonary COPD, former smoker,    Pulmonary exam normal breath sounds clear to auscultation       Cardiovascular + angina + CAD, + Past MI, + Cardiac Stents and + CABG  Normal cardiovascular exam Rhythm:Regular Rate:Normal  ECG: SR, rate 17  Sees cardiologist   Neuro/Psych negative neurological ROS  negative psych ROS   GI/Hepatic Neg liver ROS, GERD  Medicated and Controlled,  Endo/Other  Hypothyroidism   Renal/GU negative Renal ROS     Musculoskeletal negative musculoskeletal ROS (+)   Abdominal   Peds  Hematology  (+) anemia , HLD   Anesthesia Other Findings anemia  Reproductive/Obstetrics                            Anesthesia Physical Anesthesia Plan  ASA: IV  Anesthesia Plan: MAC   Post-op Pain Management:    Induction: Intravenous  PONV Risk Score and Plan: 1 and Propofol infusion and Treatment may vary due to age or medical condition  Airway Management Planned: Natural Airway and Mask  Additional Equipment:   Intra-op Plan:   Post-operative Plan:   Informed Consent: I have reviewed the patients History and Physical, chart, labs and discussed the procedure including the risks, benefits and alternatives for the proposed anesthesia with the patient or authorized representative who has indicated his/her understanding and acceptance.   Dental advisory given  Plan Discussed with: CRNA  Anesthesia Plan Comments:         Anesthesia Quick Evaluation

## 2018-03-21 NOTE — H&P (View-Only) (Signed)
Referring Provider: Dr. Candiss Norse Primary Care Physician:  Mayra Neer, MD Primary Gastroenterologist:  Dr. Therisa Doyne  Reason for Consultation:  Anemia  HPI: Timothy Millin Sr. is a 84 y.o. male with multiple medical problems as stated below being seen for symptomatic anemia with a Hgb 6.2 yesterday and now 5.6. 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 Penelope Coop. Colonoscopy in 11/2014 where a focal bleeding area of the cecum was hemoclipped. Has had upper abdominal pain for years that feels like a burning pain. Denies hematochezia or black stools but has been on iron pills until recently. Denies N/V. Abd CT showed constipation in September. UGIS showed mild GERD but limited study due to motion artifact. Family is very concerned about his pulmonary status stating that he was told that he should not be sedated again following a lung surgery.  Past Medical History:  Diagnosis Date  . Anemia   . Anginal pain ([place])   . Complication of anesthesia 1987   "didn't wake up for 5 days"  . COPD (chronic obstructive pulmonary disease) ([place])   . Coronary artery disease   . Exertional dyspnea 01/04/2012  . GI bleed due to NSAIDs 02/2012  . High cholesterol   . History of blood transfusion 1987; 11/25/2014   "emergent CABG; anemia"  . Hyperlipidemia   . Hypothyroidism   . Myocardial infarction ([place]) 1987  . Numbness and tingling in hands 01/04/2012   "right one goes to sleep on steering wheel when I'm driving; been that way long time; @ least 1 yr"  . Thyroid disease     Past Surgical History:  Procedure Laterality Date  . CARDIAC CATHETERIZATION    . CARDIAC CATHETERIZATION N/A 09/10/2014   Procedure: Left Heart Cath and Cors/Grafts Angiography;  Surgeon: Peter M Martinique, MD;  Location: [facility]ASIVE CV LAB CUP[facility];  Service: Cardiovascular;  Laterality: N/A;  . CATARACT EXTRACTION W/ INTRAOCULAR LENS  IMPLANT, BILATERAL Bilateral 2012  . COLONOSCOPY N/A 12/04/2014   Procedure:  COLONOSCOPY;  Surgeon: Teena Irani, MD;  Location: [facility] ENDOSCOPY;  Service: Endoscopy;  Laterality: N/A;  . COLONOSCOPY W/ POLYPECTOMY  2011   "had to take it out in 3 pieces; all benign"  . CORONARY ANGIOPLASTY WITH STENT PLACEMENT  ? 1990's   "3"  . CORONARY ANGIOPLASTY WITH STENT PLACEMENT  01/04/2012   "2; total of 5"  . CORONARY ARTERY BYPASS GRAFT  1987   CABG X1  . ESOPHAGOGASTRODUODENOSCOPY  02/07/2012   Procedure: ESOPHAGOGASTRODUODENOSCOPY (EGD);  Surgeon: Wonda Horner, MD;  Location: [facility] ENDOSCOPY;  Service: Endoscopy;  Laterality: N/A;  . ESOPHAGOGASTRODUODENOSCOPY (EGD) WITH PROPOFOL Left 11/26/2014   Procedure: ESOPHAGOGASTRODUODENOSCOPY (EGD) WITH PROPOFOL;  Surgeon: Arta Silence, MD;  Location: [facility] ENDOSCOPY;  Service: Endoscopy;  Laterality: Left;  . PERCUTANEOUS CORONARY STENT INTERVENTION (PCI-S) N/A 01/04/2012   Procedure: PERCUTANEOUS CORONARY STENT INTERVENTION (PCI-S);  Surgeon: Sinclair Grooms, MD;  Location: [facility] CATH LAB;  Service: Cardiovascular;  Laterality: N/A;  . WRIST FRACTURE SURGERY Left ~ 2009  . WRIST HARDWARE REMOVAL Left ~ 2010   "took steel bar out"    Prior to Admission medications   Medication Sig Start Date End Date Taking? Authorizing Provider  aspirin 81 MG tablet Take 81 mg by mouth daily.   Yes [provider]  levothyroxine (SYNTHROID, LEVOTHROID) 50 MCG tablet Take 50 mcg by mouth daily before breakfast.  01/30/16  Yes [provider]  Multiple Vitamin (MULTIVITAMIN WITH MINERALS) TABS Take  1 tablet by mouth daily.   Yes [provider]  NITROSTAT 0.4 MG SL tablet PLACE 1 TABLET (0.4 MG TOTAL) UNDER THE TONGUE EVERY 5 (FIVE) MINUTES AS NEEDED. FOR CHEST PAIN 05/18/16  Yes Jerline Pain, MD  pantoprazole (PROTONIX) 40 MG tablet Take 40 mg by mouth daily.   Yes [provider]  PROAIR HFA 108 (90 Base) MCG/ACT inhaler Inhale 2 puffs into the lungs every 4 (four) hours as needed. 02/21/18  Yes [provider]  rosuvastatin (CRESTOR) 10 MG tablet Take 1 tablet (10 mg total) by mouth daily. 06/27/17  Yes Jerline Pain, MD  sucralfate (CARAFATE) 1 g tablet Take 1 g by mouth 2 (two) times daily. 03/12/18  Yes [provider]  acetaminophen (TYLENOL) 325 MG tablet Take 2 tablets (650 mg total) by mouth every 4 (four) hours as needed for headache or mild pain. Patient not taking: Reported on 03/25/2018 09/10/14   Erlene Quan, PA-C  ferrous sulfate 325 (65 FE) MG tablet Take 1 tablet (325 mg total) by mouth 2 (two) times daily with a meal. Patient not taking: Reported on 03/21/2018 12/05/14   Thurnell Lose, MD    Scheduled Meds: . famotidine  20 mg Oral BID  . ferrous sulfate  325 mg Oral BID WC  . furosemide  20 mg Intravenous Once  . levothyroxine  50 mcg Oral Q0600  . multivitamin with minerals  1 tablet Oral Daily  . pantoprazole (PROTONIX) IV  40 mg Intravenous Q12H  . rosuvastatin  10 mg Oral Daily   Continuous Infusions: . sodium chloride 50 mL/hr at 03/21/18 0841   PRN Meds:.acetaminophen, guaiFENesin-dextromethorphan, nitroGLYCERIN  Allergies as of 03/28/2018 - Review Complete 03/09/2018  Allergen Reaction Noted  . Ibuprofen Other (See Comments) 09/09/2014  . Morphine and related Nausea And Vomiting 09/09/2014  . Atorvastatin  11/25/2014    Family History  Problem Relation Age of Onset  . Emphysema Father        ? if he smoked or not     Social History   Socioeconomic History  . Marital status: Married    Spouse name: Not on file  . Number of children: Not on file  . Years of education: Not on file  . Highest education level: Not on file  Occupational History  . Not on file  Social Needs  . Financial resource strain: Not on file  . Food insecurity:    Worry: Not on file    Inability: Not on file  . Transportation needs:    Medical: Not on file    Non-medical: Not on file  Tobacco Use  . Smoking status: Former Smoker    Packs/day: 1.00    Years:  40.00    Pack years: 40.00    Types: Cigarettes    Last attempt to quit: 05/08/1988    Years since quitting: 29.8  . Smokeless tobacco: Never Used  Substance and Sexual Activity  . Alcohol use: No    Comment: 11/25/2014 "hadn't drank any alcohol in the 2000's"  . Drug use: No  . Sexual activity: Yes  Lifestyle  . Physical activity:    Days per week: Not on file    Minutes per session: Not on file  . Stress: Not on file  Relationships  . Social connections:    Talks on phone: Not on file    Gets together: Not on file    Attends religious service: Not on file  Active member of club or organization: Not on file    Attends meetings of clubs or organizations: Not on file    Relationship status: Not on file  . Intimate partner violence:    Fear of current or ex partner: Not on file    Emotionally abused: Not on file    Physically abused: Not on file    Forced sexual activity: Not on file  Other Topics Concern  . Not on file  Social History Narrative   ** Merged History Encounter **        Review of Systems: All negative except as stated above in HPI.  Physical Exam: Vital signs: Vitals:   03/21/18 0445 03/21/18 0800  BP: 121/60 116/63  Pulse: 79 88  Resp: 17 18  Temp: 98.8 F (37.1 C) 98.4 F (36.9 C)  SpO2: 93% (!) 89%   Last BM Date: 03/29/2018 General:   Elderly, frail, thin, pleasant and cooperative in NAD Head: normocephalic, atraumatic Eyes: anicteric sclera ENT: oropharynx clear Neck: supple, nontender Lungs:  Clear throughout to auscultation.   No wheezes, crackles, or rhonchi. No acute distress. Heart:  Regular rate and rhythm; no murmurs, clicks, rubs,  or gallops. Abdomen: diffuse tenderness (greatest in epigastric and RUQ) with guarding, soft, nondistended, +BS  Rectal:  Deferred Ext: no edema  GI:  Lab Results: Recent Labs    03/21/2018 1818 03/31/2018 2217  WBC 8.3 7.2  HGB 6.2* 5.6*  HCT 21.9* 19.1*  PLT 410* 362   BMET Recent Labs     04/05/2018 1818  NA 137  K 3.3*  CL 105  CO2 23  GLUCOSE 113*  BUN 15  CREATININE 0.84  CALCIUM 8.7*   LFT Recent Labs    03/13/2018 1818  PROT 7.3  ALBUMIN 3.1*  AST 20  ALT 9  ALKPHOS 170*  BILITOT 0.4   PT/INR Recent Labs    03/31/2018 1818  LABPROT 14.4  INR 1.13     Studies/Results: Dg Chest Port 1 View  Result Date: 04/02/2018 CLINICAL DATA:  Initial evaluation for shortness of breath. History of COPD. EXAM: PORTABLE CHEST 1 VIEW COMPARISON:  Prior radiograph from 11/05/2017. FINDINGS: Median sternotomy wires noted. Underlying cardiomegaly, unchanged. Coronary stent noted. Mediastinal silhouette normal. Aortic atherosclerosis. Lungs hypoinflated. Underlying COPD with irregular biapical pleuroparenchymal scarring, right greater than left. Small layering bilateral pleural effusions, left greater than right. Associated bibasilar opacities likely atelectasis, although infiltrates could be considered in the correct clinical setting. No overt pulmonary edema. No pneumothorax. No acute osseous abnormality. IMPRESSION: 1. Small bilateral pleural effusions, left greater than right. Associated bibasilar opacities likely reflect atelectasis, although infiltrates could be considered in the correct clinical setting. 2. Cardiomegaly with underlying COPD. 3. Aortic atherosclerosis. Electronically Signed   By: Jeannine Boga M.D.   On: 03/11/2018 18:48    Impression/Plan: 84 yo with abdominal pain and symptomatic anemia concerning for peptic ulcer disease vs malignancy. History of AVMs. He needs an updated EGD but daughter is very concerned about his pulmonary status so anesthesia will need to discuss further with family. Discussed risks/benefits of EGD with patient and his family but they are hesitant to agree to it and want to discuss further with anesthesia. NPO p MN. If EGD is unrevealing, then may need a colonoscopy in the near future. Clear liquid diet. NPO p MN. Supportive care.      LOS: 1 day   Lear Ng  03/21/2018, 9:33 AM  Questions please call [phone]

## 2018-03-21 NOTE — Progress Notes (Signed)
Initial Nutrition Assessment  DOCUMENTATION CODES:   Severe malnutrition in context of chronic illness, Underweight  INTERVENTION:  When diet advanced:  -Ensure Enlive BID. Each supplement provides 350 kcal and 20 grams protein   -MVI daily  NUTRITION DIAGNOSIS:   Severe Malnutrition related to chronic illness(chronic GI bleed; lung cancer recent radiation therapy; COPD) as evidenced by severe fat depletion, severe muscle depletion.  GOAL:   Patient will meet greater than or equal to 90% of their needs  MONITOR:   Diet advancement, PO intake, Supplement acceptance, Weight trends, Labs  REASON FOR ASSESSMENT:   (Low BMI)    ASSESSMENT:  [name] is a 84 yo male with PMH of chronic recurrent anemia secondary to GI bleed due to NSAID use, recurrent abdominal pain, HLD, CAD, hypothyroidism, lung cancer previous radiation therapy ending in 2019, severe COPD admitted for symptomatic anemia and SOB.   Visited pt in room with wife and daughter present. Pt sitting in recliner by window. Pt is hard of hearing and with incoherent speech, so most hx derived from wife and daughter.   PTA family said he had decreased appetite for months.   Diet recall: Breakfast: 2 sausage biscuits, or special K cereal  Lunch: wife is unsure what he eats since she is at work Holiday representative: wife cooks steamed vegetables, meat, baked potato, bread Snacks: per family pt eats snacks, chips, cookies Beverages: coffee, water, tea, sprite  Per family pt UBW is 140-145 lbs and he has had gradual weight loss over past few months. Pt has had chronic GI bleed with anemia with accompanied fatigue and weakness. Per chart pt has lost 7% body wt since 7/19 though not significant for time frame.   Pt family reports they had tried to previously give him Ensure. Pt liked them but was "stubborn" to drinking them. Educated family on importance of continuing some kind of supplement (Ensure or something similar) to prevent  further wt loss.   Per chart pt was also recently receiving radiation treatments for lung cancer.   During visit pt insists he needs to get up out of chair; tried to keep pt stationary until care team could be called as he was attached to IV and oxygen and had unsteady gait. Pt had urinary accident on himself. Reminded pt and family that they must call for assistance if pt needs to ambulate.   Physical exam findings are severe fat depletion, severe muscle depletion. Patient meets criteria for severe malnutrition in context of chronic illness.   Pt currently on clear liquid diet, to be NPO after midnight.  Will provide Ensure Enlive once diet advanced.   Meds: ferrous sulfate 325 mg, lasix, synthroid, MVI, Protonix, Pepcid Labs: Hgb 5.6, CBG 98  NUTRITION - FOCUSED PHYSICAL EXAM:    Most Recent Value  Orbital Region  Severe depletion  Upper Arm Region  Severe depletion  Thoracic and Lumbar Region  Severe depletion  Buccal Region  Severe depletion  Temple Region  Moderate depletion  Clavicle Bone Region  Severe depletion  Clavicle and Acromion Bone Region  Severe depletion  Scapular Bone Region  Moderate depletion  Dorsal Hand  Moderate depletion  Patellar Region  Severe depletion  Anterior Thigh Region  Severe depletion  Posterior Calf Region  Severe depletion  Edema (RD Assessment)  None  Hair  Reviewed  Eyes  Reviewed  Mouth  Reviewed  Skin  Reviewed  Nails  Reviewed      Diet Order:   Diet Order  Diet NPO time specified  Diet effective midnight        Diet clear liquid Room service appropriate? Yes; Fluid consistency: Thin  Diet effective now              EDUCATION NEEDS:   Education needs have been addressed(with family)  Skin:  Skin Assessment: Reviewed RN Assessment  Last BM:  11/13  Height:   Ht Readings from Last 1 Encounters:  03/14/2018 5\' 8"  (1.727 m)    Weight:   Wt Readings from Last 1 Encounters:  04/04/2018 54.4 kg    Ideal  Body Weight:  70 kg  BMI:  Body mass index is 18.25 kg/m.  Estimated Nutritional Needs:   Kcal:  1900-2100  Protein:  95-105 gm  Fluid:  >/=1.9 L/day    Naida Sleight, Dietetic Intern [phone]

## 2018-03-21 NOTE — Progress Notes (Signed)
Patient noted to have slurred speech. Family stated, " this is new since admission, he's been like this all day, and we think its just from him having a sore chest from coughing so much". MD notified and stated, "check CBG, if normal, I will order STAT CT" . CBG obtained and noted to be 112. MD ordered STAT CT scan as stated. Patient currently a/o X 4 , no other neurological deficits noted upon assessment, charge RN notified, and family at the bedside. RN to continue to monitor.

## 2018-03-21 NOTE — Consult Note (Signed)
Referring Provider: Dr. Candiss Norse Primary Care Physician:  Mayra Neer, MD Primary Gastroenterologist:  Dr. Therisa Doyne  Reason for Consultation:  Anemia  HPI: Timothy Millin Sr. is a 84 y.o. male with multiple medical problems as stated below being seen for symptomatic anemia with a Hgb 6.2 yesterday and now 5.6. 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 Penelope Coop. Colonoscopy in 11/2014 where a focal bleeding area of the cecum was hemoclipped. Has had upper abdominal pain for years that feels like a burning pain. Denies hematochezia or black stools but has been on iron pills until recently. Denies N/V. Abd CT showed constipation in September. UGIS showed mild GERD but limited study due to motion artifact. Family is very concerned about his pulmonary status stating that he was told that he should not be sedated again following a lung surgery.  Past Medical History:  Diagnosis Date  . Anemia   . Anginal pain ([place])   . Complication of anesthesia 1987   "didn't wake up for 5 days"  . COPD (chronic obstructive pulmonary disease) ([place])   . Coronary artery disease   . Exertional dyspnea 01/04/2012  . GI bleed due to NSAIDs 02/2012  . High cholesterol   . History of blood transfusion 1987; 11/25/2014   "emergent CABG; anemia"  . Hyperlipidemia   . Hypothyroidism   . Myocardial infarction ([place]) 1987  . Numbness and tingling in hands 01/04/2012   "right one goes to sleep on steering wheel when I'm driving; been that way long time; @ least 1 yr"  . Thyroid disease     Past Surgical History:  Procedure Laterality Date  . CARDIAC CATHETERIZATION    . CARDIAC CATHETERIZATION N/A 09/10/2014   Procedure: Left Heart Cath and Cors/Grafts Angiography;  Surgeon: Peter M Martinique, MD;  Location: [facility] INVASIVE CV LAB CUPID;  Service: Cardiovascular;  Laterality: N/A;  . CATARACT EXTRACTION W/ INTRAOCULAR LENS  IMPLANT, BILATERAL Bilateral 2012  . COLONOSCOPY N/A 12/04/2014   Procedure:  COLONOSCOPY;  Surgeon: Teena Irani, MD;  Location: [facility] ENDOSCOPY;  Service: Endoscopy;  Laterality: N/A;  . COLONOSCOPY W/ POLYPECTOMY  2011   "had to take it out in 3 pieces; all benign"  . CORONARY ANGIOPLASTY WITH STENT PLACEMENT  ? 1990's   "3"  . CORONARY ANGIOPLASTY WITH STENT PLACEMENT  01/04/2012   "2; total of 5"  . CORONARY ARTERY BYPASS GRAFT  1987   CABG X1  . ESOPHAGOGASTRODUODENOSCOPY  02/07/2012   Procedure: ESOPHAGOGASTRODUODENOSCOPY (EGD);  Surgeon: Wonda Horner, MD;  Location: [facility] ENDOSCOPY;  Service: Endoscopy;  Laterality: N/A;  . ESOPHAGOGASTRODUODENOSCOPY (EGD) WITH PROPOFOL Left 11/26/2014   Procedure: ESOPHAGOGASTRODUODENOSCOPY (EGD) WITH PROPOFOL;  Surgeon: Arta Silence, MD;  Location: [facility] ENDOSCOPY;  Service: Endoscopy;  Laterality: Left;  . PERCUTANEOUS CORONARY STENT INTERVENTION (PCI-S) N/A 01/04/2012   Procedure: PERCUTANEOUS CORONARY STENT INTERVENTION (PCI-S);  Surgeon: Sinclair Grooms, MD;  Location: [facility] CATH LAB;  Service: Cardiovascular;  Laterality: N/A;  . WRIST FRACTURE SURGERY Left ~ 2009  . WRIST HARDWARE REMOVAL Left ~ 2010   "took steel bar out"    Prior to Admission medications   Medication Sig Start Date End Date Taking? Authorizing Provider  aspirin 81 MG tablet Take 81 mg by mouth daily.   Yes [provider]  levothyroxine (SYNTHROID, LEVOTHROID) 50 MCG tablet Take 50 mcg by mouth daily before breakfast.  01/30/16  Yes [provider]  Multiple Vitamin (MULTIVITAMIN WITH MINERALS) TABS Take  1 tablet by mouth daily.   Yes [provider]  NITROSTAT 0.4 MG SL tablet PLACE 1 TABLET (0.4 MG TOTAL) UNDER THE TONGUE EVERY 5 (FIVE) MINUTES AS NEEDED. FOR CHEST PAIN 05/18/16  Yes Jerline Pain, MD  pantoprazole (PROTONIX) 40 MG tablet Take 40 mg by mouth daily.   Yes [provider]  PROAIR HFA 108 (90 Base) MCG/ACT inhaler Inhale 2 puffs into the lungs every 4 (four) hours as needed. 02/21/18  Yes [provider]  rosuvastatin (CRESTOR) 10 MG tablet Take 1 tablet (10 mg total) by mouth daily. 06/27/17  Yes Jerline Pain, MD  sucralfate (CARAFATE) 1 g tablet Take 1 g by mouth 2 (two) times daily. 03/12/18  Yes [provider]  acetaminophen (TYLENOL) 325 MG tablet Take 2 tablets (650 mg total) by mouth every 4 (four) hours as needed for headache or mild pain. Patient not taking: Reported on 03/09/2018 09/10/14   Erlene Quan, PA-C  ferrous sulfate 325 (65 FE) MG tablet Take 1 tablet (325 mg total) by mouth 2 (two) times daily with a meal. Patient not taking: Reported on 03/28/2018 12/05/14   Thurnell Lose, MD    Scheduled Meds: . famotidine  20 mg Oral BID  . ferrous sulfate  325 mg Oral BID WC  . furosemide  20 mg Intravenous Once  . levothyroxine  50 mcg Oral Q0600  . multivitamin with minerals  1 tablet Oral Daily  . pantoprazole (PROTONIX) IV  40 mg Intravenous Q12H  . rosuvastatin  10 mg Oral Daily   Continuous Infusions: . sodium chloride 50 mL/hr at 03/21/18 0841   PRN Meds:.acetaminophen, guaiFENesin-dextromethorphan, nitroGLYCERIN  Allergies as of 03/29/2018 - Review Complete 03/31/2018  Allergen Reaction Noted  . Ibuprofen Other (See Comments) 09/09/2014  . Morphine and related Nausea And Vomiting 09/09/2014  . Atorvastatin  11/25/2014    Family History  Problem Relation Age of [place]  . Emphysema Father        ? if he smoked or not     Social History   Socioeconomic History  . Marital status: Married    Spouse name: Not on file  . Number of children: Not on file  . Years of education: Not on file  . Highest education level: Not on file  Occupational History  . Not on file  Social Needs  . Financial resource strain: Not on file  . Food insecurity:    Worry: Not on file    Inability: Not on file  . Transportation needs:    Medical: Not on file    Non-medical: Not on file  Tobacco Use  . Smoking status: Former Smoker    Packs/day: 1.00    Years:  40.00    Pack years: 40.00    Types: Cigarettes    Last attempt to quit: 05/08/1988    Years since quitting: 29.8  . Smokeless tobacco: Never Used  Substance and Sexual Activity  . Alcohol use: No    Comment: 11/25/2014 "hadn't drank any alcohol in the 2000's"  . Drug use: No  . Sexual activity: Yes  Lifestyle  . Physical activity:    Days per week: Not on file    Minutes per session: Not on file  . Stress: Not on file  Relationships  . Social connections:    Talks on phone: Not on file    Gets together: Not on file    Attends religious service: Not on file  Active member of club or organization: Not on file    Attends meetings of clubs or organizations: Not on file    Relationship status: Not on file  . Intimate partner violence:    Fear of current or ex partner: Not on file    Emotionally abused: Not on file    Physically abused: Not on file    Forced sexual activity: Not on file  Other Topics Concern  . Not on file  Social History Narrative   ** Merged History Encounter **        Review of Systems: All negative except as stated above in HPI.  Physical Exam: Vital signs: Vitals:   03/21/18 0445 03/21/18 0800  BP: 121/60 116/63  Pulse: 79 88  Resp: 17 18  Temp: 98.8 F (37.1 C) 98.4 F (36.9 C)  SpO2: 93% (!) 89%   Last BM Date: 04/03/2018 General:   Elderly, frail, thin, pleasant and cooperative in NAD Head: normocephalic, atraumatic Eyes: anicteric sclera ENT: oropharynx clear Neck: supple, nontender Lungs:  Clear throughout to auscultation.   No wheezes, crackles, or rhonchi. No acute distress. Heart:  Regular rate and rhythm; no murmurs, clicks, rubs,  or gallops. Abdomen: diffuse tenderness (greatest in epigastric and RUQ) with guarding, soft, nondistended, +BS  Rectal:  Deferred Ext: no edema  GI:  Lab Results: Recent Labs    03/19/2018 1818 03/30/2018 2217  WBC 8.3 7.2  HGB 6.2* 5.6*  HCT 21.9* 19.1*  PLT 410* 362   BMET Recent Labs     03/19/2018 1818  NA 137  K 3.3*  CL 105  CO2 23  GLUCOSE 113*  BUN 15  CREATININE 0.84  CALCIUM 8.7*   LFT Recent Labs    03/13/2018 1818  PROT 7.3  ALBUMIN 3.1*  AST 20  ALT 9  ALKPHOS 170*  BILITOT 0.4   PT/INR Recent Labs    03/30/2018 1818  LABPROT 14.4  INR 1.13     Studies/Results: Dg Chest Port 1 View  Result Date: 04/04/2018 CLINICAL DATA:  Initial evaluation for shortness of breath. History of COPD. EXAM: PORTABLE CHEST 1 VIEW COMPARISON:  Prior radiograph from 11/05/2017. FINDINGS: Median sternotomy wires noted. Underlying cardiomegaly, unchanged. Coronary stent noted. Mediastinal silhouette normal. Aortic atherosclerosis. Lungs hypoinflated. Underlying COPD with irregular biapical pleuroparenchymal scarring, right greater than left. Small layering bilateral pleural effusions, left greater than right. Associated bibasilar opacities likely atelectasis, although infiltrates could be considered in the correct clinical setting. No overt pulmonary edema. No pneumothorax. No acute osseous abnormality. IMPRESSION: 1. Small bilateral pleural effusions, left greater than right. Associated bibasilar opacities likely reflect atelectasis, although infiltrates could be considered in the correct clinical setting. 2. Cardiomegaly with underlying COPD. 3. Aortic atherosclerosis. Electronically Signed   By: Jeannine Boga M.D.   On: 03/28/2018 18:48    Impression/Plan: 84 yo with abdominal pain and symptomatic anemia concerning for peptic ulcer disease vs malignancy. History of AVMs. He needs an updated EGD but daughter is very concerned about his pulmonary status so anesthesia will need to discuss further with family. Discussed risks/benefits of EGD with patient and his family but they are hesitant to agree to it and want to discuss further with anesthesia. NPO p MN. If EGD is unrevealing, then may need a colonoscopy in the near future. Clear liquid diet. NPO p MN. Supportive care.      LOS: 1 day   Lear Ng  03/21/2018, 9:33 AM  Questions please call [phone]

## 2018-03-21 NOTE — Evaluation (Signed)
Physical Therapy Evaluation Patient Details Name: Timothy JASMIN Sr. MRN: [id number] DOB: 12-26-1933 Today's Date: 03/21/2018   History of Present Illness  Pt is an 84 y/o male admitted secondary to symptomatic anemia. Per notes, will likely have EGD during admission. PMH includes MI, COPD, CAD s/p CABG, and history of AVM.   Clinical Impression  Pt admitted secondary to problem above with deficits below. Pt with very poor activity tolerance with notable SOB during ambulation to bathroom and back to recliner. Oxygen sats on 2L fluctuating between 83-85% on 2L. RN in room and bumped up to 3L and oxygen sats returned to 90%. DOE at 3/4. Pt unsteady and requiring min A for steadying assist throughout. Pt also slightly impulsive and required safety cues throughout session. Feel pt will require short term SNF at this time given mobility limitations. May be able to progress to home with HHPT if tolerance/steadiness improves. Will continue to follow acutely to maximize functional mobility independence and safety.     Follow Up Recommendations SNF;Supervision for mobility/OOB(may be able to progress to home with HHPT )    Equipment Recommendations  None recommended by PT    Recommendations for Other Services OT consult     Precautions / Restrictions Precautions Precautions: Fall;Other (comment) Precaution Comments: watch O2 sats  Restrictions Weight Bearing Restrictions: No      Mobility  Bed Mobility Overal bed mobility: Needs Assistance Bed Mobility: Supine to Sit     Supine to sit: Min assist     General bed mobility comments: Min A for assist with trunk elevation.   Transfers Overall transfer level: Needs assistance Equipment used: None Transfers: Sit to/from Stand Sit to Stand: Min assist         General transfer comment: Pt requiring safety cues to wait for PT to stand. Min A for steadying assist as pt had LOB upon standing.   Ambulation/Gait Ambulation/Gait  assistance: Min assist Gait Distance (Feet): 20 Feet Assistive device: None Gait Pattern/deviations: Step-through pattern;Decreased stride length Gait velocity: Decreased    General Gait Details: Slow, unsteady gait. Refusing to use AD during gait to the bathroom and back to chair. Pt with notable DOE without use of oxygen. Pulse ox took prolonged time to get reading, so reapplied 2L of oxygen. Pulse ox fluctuating between 83% and 85% on 2L, therefore RN bumped up to 3L. Took prolonged time, however, sats eventually rose to 90% on 2L. Cues for pursed lip breathing as pt tended to breathe through his mouth.   Stairs            Wheelchair Mobility    Modified Rankin (Stroke Patients Only)       Balance Overall balance assessment: Needs assistance Sitting-balance support: No upper extremity supported;Feet supported Sitting balance-Leahy Scale: Fair     Standing balance support: No upper extremity supported;During functional activity Standing balance-Leahy Scale: Poor Standing balance comment: Reliant on external assist.                              Pertinent Vitals/Pain Pain Assessment: No/denies pain    Home Living Family/patient expects to be discharged to:: Private residence Living Arrangements: Spouse/significant other Available Help at Discharge: Family;Available 24 hours/day Type of Home: House Home Access: Stairs to enter Entrance Stairs-Rails: Right;Left;Can reach both Entrance Stairs-Number of Steps: 5 Home Layout: One level Home Equipment: Walker - 4 wheels;Cane - single point      Prior Function Level  of Independence: Independent with assistive device(s)         Comments: Reports occasional use of cane.      Hand Dominance        Extremity/Trunk Assessment   Upper Extremity Assessment Upper Extremity Assessment: Defer to OT evaluation    Lower Extremity Assessment Lower Extremity Assessment: Generalized weakness    Cervical /  Trunk Assessment Cervical / Trunk Assessment: Kyphotic  Communication   Communication: HOH  Cognition Arousal/Alertness: Awake/alert Behavior During Therapy: WFL for tasks assessed/performed Overall Cognitive Status: No family/caregiver present to determine baseline cognitive functioning                                 General Comments: Was alert and oriented X4, however, did present with decreased safety awareness. Required cues to wait for PT and was slightly impulsive.       General Comments General comments (skin integrity, edema, etc.): RN cleared for participation, as pt received 2 units of blood prior to session. RN in room throughout session. Pt with BM that was darker in color and RN in room to assess.     Exercises     Assessment/Plan    PT Assessment Patient needs continued PT services  PT Problem List Cardiopulmonary status limiting activity;Decreased knowledge of precautions;Decreased safety awareness;Decreased knowledge of use of DME;Decreased mobility;Decreased balance;Decreased activity tolerance;Decreased strength       PT Treatment Interventions DME instruction;Gait training;Stair training;Functional mobility training;Therapeutic activities;Therapeutic exercise;Balance training;Patient/family education    PT Goals (Current goals can be found in the Care Plan section)  Acute Rehab PT Goals Patient Stated Goal: to be able to breathe better PT Goal Formulation: With patient Time For Goal Achievement: 04/04/18 Potential to Achieve Goals: Good    Frequency Min 2X/week   Barriers to discharge        Co-evaluation               AM-PAC PT "6 Clicks" Daily Activity  Outcome Measure Difficulty turning over in bed (including adjusting bedclothes, sheets and blankets)?: A Little Difficulty moving from lying on back to sitting on the side of the bed? : Unable Difficulty sitting down on and standing up from a chair with arms (e.g., wheelchair,  bedside commode, etc,.)?: Unable Help needed moving to and from a bed to chair (including a wheelchair)?: A Little Help needed walking in hospital room?: A Little Help needed climbing 3-5 steps with a railing? : A Lot 6 Click Score: 13    End of Session Equipment Utilized During Treatment: Gait belt Activity Tolerance: Patient limited by fatigue;Treatment limited secondary to medical complications (Comment)(decreased oxygen sats ) Patient left: in chair;with call bell/phone within reach;with chair alarm set;with nursing/sitter in room Nurse Communication: Mobility status PT Visit Diagnosis: Unsteadiness on feet (R26.81);Muscle weakness (generalized) (M62.81)    Time: 8242-3536 PT Time Calculation (min) (ACUTE ONLY): 28 min   Charges:   PT Evaluation $PT Eval Moderate Complexity: 1 Mod PT Treatments $Therapeutic Activity: 8-22 mins        Leighton Ruff, PT, DPT  Acute Rehabilitation Services  Pager: [phone] Office: [phone]   Rudean Hitt 03/21/2018, 11:29 AM

## 2018-03-22 ENCOUNTER — Inpatient Hospital Stay (HOSPITAL_COMMUNITY): Payer: Medicare Other

## 2018-03-22 ENCOUNTER — Inpatient Hospital Stay (HOSPITAL_COMMUNITY): Payer: Medicare Other | Admitting: Anesthesiology

## 2018-03-22 ENCOUNTER — Encounter (HOSPITAL_COMMUNITY): Admission: EM | Disposition: E | Payer: Self-pay | Source: Home / Self Care | Attending: Internal Medicine

## 2018-03-22 ENCOUNTER — Encounter (HOSPITAL_COMMUNITY): Payer: Self-pay | Admitting: *Deleted

## 2018-03-22 HISTORY — PX: ESOPHAGOGASTRODUODENOSCOPY (EGD) WITH PROPOFOL: SHX5813

## 2018-03-22 LAB — HEMOGLOBIN AND HEMATOCRIT, BLOOD
HCT: 29.1 % — ABNORMAL LOW (ref 39.0–52.0)
Hemoglobin: 9.2 g/dL — ABNORMAL LOW (ref 13.0–17.0)

## 2018-03-22 LAB — CBC
HEMATOCRIT: 28.9 % — AB (ref 39.0–52.0)
HEMOGLOBIN: 8.7 g/dL — AB (ref 13.0–17.0)
MCH: 25.1 pg — AB (ref 26.0–34.0)
MCHC: 30.1 g/dL (ref 30.0–36.0)
MCV: 83.3 fL (ref 80.0–100.0)
Platelets: 329 10*3/uL (ref 150–400)
RBC: 3.47 MIL/uL — AB (ref 4.22–5.81)
RDW: 15.2 % (ref 11.5–15.5)
WBC: 12.6 10*3/uL — ABNORMAL HIGH (ref 4.0–10.5)
nRBC: 0.2 % (ref 0.0–0.2)

## 2018-03-22 LAB — TYPE AND SCREEN
ABO/RH(D): A POS
Antibody Screen: NEGATIVE
UNIT DIVISION: 0
Unit division: 0

## 2018-03-22 LAB — BASIC METABOLIC PANEL
ANION GAP: 8 (ref 5–15)
BUN: 8 mg/dL (ref 8–23)
CO2: 24 mmol/L (ref 22–32)
Calcium: 8.3 mg/dL — ABNORMAL LOW (ref 8.9–10.3)
Chloride: 106 mmol/L (ref 98–111)
Creatinine, Ser: 0.9 mg/dL (ref 0.61–1.24)
GFR calc Af Amer: >60 mL/min (ref >60)
GLUCOSE: 106 mg/dL — AB (ref 70–99)
POTASSIUM: 3.4 mmol/L — AB (ref 3.5–5.1)
Sodium: 138 mmol/L (ref 135–145)

## 2018-03-22 LAB — BPAM RBC
BLOOD PRODUCT EXPIRATION DATE: 201912062359
BLOOD PRODUCT EXPIRATION DATE: 201912062359
ISSUE DATE / TIME: 201911140424
ISSUE DATE / TIME: 201911132234
UNIT TYPE AND RH: 6200
Unit Type and Rh: 6200

## 2018-03-22 LAB — GLUCOSE, CAPILLARY: Glucose-Capillary: 100 mg/dL — ABNORMAL HIGH (ref 70–99)

## 2018-03-22 LAB — MAGNESIUM: MAGNESIUM: 2 mg/dL (ref 1.7–2.4)

## 2018-03-22 SURGERY — ESOPHAGOGASTRODUODENOSCOPY (EGD) WITH PROPOFOL
Anesthesia: Monitor Anesthesia Care

## 2018-03-22 MED ORDER — SODIUM CHLORIDE 0.9 % IV SOLN
1.5000 g | Freq: Four times a day (QID) | INTRAVENOUS | Status: DC
  Administered 2018-03-22 – 2018-03-26 (×15): 1.5 g via INTRAVENOUS
  Filled 2018-03-22 (×16): qty 1.5

## 2018-03-22 MED ORDER — NYSTATIN 100000 UNIT/ML MT SUSP
5.0000 mL | Freq: Four times a day (QID) | OROMUCOSAL | Status: AC
  Administered 2018-03-22: 500000 [IU] via OROMUCOSAL
  Filled 2018-03-22 (×2): qty 5

## 2018-03-22 MED ORDER — PROPOFOL 500 MG/50ML IV EMUL
INTRAVENOUS | Status: DC | PRN
  Administered 2018-03-22: 25 ug/kg/min via INTRAVENOUS

## 2018-03-22 MED ORDER — LORAZEPAM 2 MG/ML IJ SOLN
0.5000 mg | Freq: Once | INTRAMUSCULAR | Status: AC
  Administered 2018-03-22: 0.5 mg via INTRAVENOUS
  Filled 2018-03-22: qty 1

## 2018-03-22 MED ORDER — SODIUM CHLORIDE 0.9 % IV SOLN
INTRAVENOUS | Status: DC
  Administered 2018-03-22: 13:00:00 via INTRAVENOUS

## 2018-03-22 SURGICAL SUPPLY — 15 items

## 2018-03-22 NOTE — Progress Notes (Signed)
Pharmacy Antibiotic Note  Timothy RATCHFORD Sr. is a 84 y.o. male admitted on 04/01/2018 with shortness of breath.  Pharmacy has been consulted for Unasyn dosing for aspiration pneumonia.   Plan: Unasyn 1.5 gm IV q6 hours.  Monitor clinical status, renal function and culture results daily.    Height: 5\' 8"  (172.7 cm) Weight: 120 lb (54.4 kg) IBW/kg (Calculated) : 68.4  Temp (24hrs), Avg:98.3 F (36.8 C), Min:97.6 F (36.4 C), Max:98.9 F (37.2 C)  Recent Labs  Lab 03/10/2018 1818 04/06/2018 2217 03/21/18 0953 03/15/2018 0244  WBC 8.3 7.2  --  12.6*  CREATININE 0.84  --  0.85 0.90    Estimated Creatinine Clearance: 48.7 mL/min (by C-G formula based on SCr of 0.9 mg/dL).    Allergies  Allergen Reactions  . Ativan [Lorazepam]   . Ibuprofen Other (See Comments)    Internal bleeding  . Morphine And Related Nausea And Vomiting  . Atorvastatin     Muscle pain    Antimicrobials this admission: Unasyn 11/14>>  Dose adjustments this admission:   Microbiology results: None  Thank you for allowing pharmacy to be a part of this patient's care. Nicole Cella, RPh Clinical Pharmacist Please check AMION for all [facility] phone numbers After 10:00 PM, call [place] [phone] 03/15/2018 5:26 PM

## 2018-03-22 NOTE — Progress Notes (Signed)
OT Cancellation Note  Patient Details Name: [name] Sr. MRN: [id number] DOB: 1934-03-30   Cancelled Treatment:    Reason Eval/Treat Not Completed: Medical issues which prohibited therapy.  Pt coughing with venti mask in place.  Family reports he aspirated Jello, RN confirmed this possibility.  Will reattempt.  Lucille Passy, OTR/L Acute Rehabilitation Services Pager [phone] Office [phone]   Lucille Passy M 03/15/2018, 4:10 PM

## 2018-03-22 NOTE — Op Note (Signed)
[facility] Patient Name: [name] Procedure Date : 03/23/2018 MRN: [id number] Attending MD: Lear Ng , MD Date of Birth: 04-02-1934 CSN: [id number] Age: 84 Admit Type: Inpatient Procedure:                Upper GI endoscopy Indications:              Suspected upper gastrointestinal bleeding, Iron                            deficiency anemia Providers:                Lear Ng, MD, Cleda Daub, RN, Elspeth Cho Tech., Technician Referring MD:             hospital team Medicines:                Propofol per Anesthesia, Monitored Anesthesia Care Complications:            No immediate complications. Estimated Blood Loss:     Estimated blood loss: none. Procedure:                Pre-Anesthesia Assessment:                           - Prior to the procedure, a History and Physical                            was performed, and patient medications and                            allergies were reviewed. The patient's tolerance of                            previous anesthesia was also reviewed. The risks                            and benefits of the procedure and the sedation                            options and risks were discussed with the patient.                            All questions were answered, and informed consent                            was obtained. Prior Anticoagulants: The patient has                            taken no previous anticoagulant or antiplatelet                            agents. ASA Grade Assessment: IV - A patient with  severe systemic disease that is a constant threat                            to life. After reviewing the risks and benefits,                            the patient was deemed in satisfactory condition to                            undergo the procedure.                           After obtaining informed consent, the endoscope was            passed under direct vision. Throughout the                            procedure, the patient's blood pressure, pulse, and                            oxygen saturations were monitored continuously. The                            GIF-H190 ([id number] Olympus Adult EGD was introduced                            through the mouth, and advanced to the second part                            of duodenum. The upper GI endoscopy was                            accomplished without difficulty. The patient                            tolerated the procedure well. Scope In: Scope Out: Findings:      Localized, white plaques were found in the proximal esophagus.      A widely patent Schatzki ring was found at the gastroesophageal junction.      Patchy minimal inflammation characterized by congestion (edema),       erosions and erythema was found in the gastric antrum.      A medium amount of food (residue) was found in the gastric fundus and in       the gastric body.      A small hiatal hernia was present.      The examined duodenum was normal. Impression:               - Esophageal plaques were found, suspicious for                            candidiasis.                           - Widely patent Schatzki ring.                           -  Acute gastritis.                           - A medium amount of food (residue) in the stomach.                           - Small hiatal hernia.                           - Normal examined duodenum.                           - No specimens collected. Recommendation:           - Advance diet as tolerated and clear liquid diet.                           - Observe patient's clinical course. Procedure Code(s):        --- Professional ---                           [phone], Esophagogastroduodenoscopy, flexible,                            transoral; diagnostic, including collection of                            specimen(s) by brushing or washing, when performed                             (separate procedure) Diagnosis Code(s):        --- Professional ---                           D50.9, Iron deficiency anemia, unspecified                           K29.00, Acute gastritis without bleeding                           K22.2, Esophageal obstruction                           K22.9, Disease of esophagus, unspecified                           K44.9, Diaphragmatic hernia without obstruction or                            gangrene CPT copyright 2018 [employer]. All rights reserved. The codes documented in this report are preliminary and upon coder review may  be revised to meet current compliance requirements. Lear Ng, MD 03/19/2018 9:04:26 AM This report has been signed electronically. Number of Addenda: 0

## 2018-03-22 NOTE — Brief Op Note (Signed)
Minimal gastritis. Minimal esophageal candidiasis. Nystatin swish and swallow. No source of anemia or abdominal pain seen. Suspect small bowel AVMs as source of anemia. Hold off on any further invasive GI procedures and manage conservatively. Clear liquid diet and advance as tolerated.

## 2018-03-22 NOTE — Progress Notes (Signed)
SLP Cancellation Note  Patient Details Name: Timothy LEWMAN Sr. MRN: [id number] DOB: 12-Jul-1933   Cancelled treatment:       Reason Eval/Treat Not Completed: Other (comment) Pt recently returned from endoscopy, cleared for clear liquid diet by GI. Per RN he is still drowsy post-procedure. Will reattempt swallow evaluation at a later time when he is more alert.   [name] 03/15/2018, 10:56 AM  [name], M.A. [place] Acute Environmental education officer [phone] Office [phone]

## 2018-03-22 NOTE — Transfer of Care (Signed)
Immediate Anesthesia Transfer of Care Note  Patient: Timothy KORAL Sr.  Procedure(s) Performed: ESOPHAGOGASTRODUODENOSCOPY (EGD) WITH PROPOFOL (N/A )  Patient Location: PACU  Anesthesia Type:MAC  Level of Consciousness: awake, patient cooperative and responds to stimulation  Airway & Oxygen Therapy: Patient Spontanous Breathing and Patient connected to nasal cannula oxygen  Post-op Assessment: Report given to RN and Post -op Vital signs reviewed and stable  Post vital signs: Reviewed and stable  Last Vitals:  Vitals Value Taken Time  BP 106/45 03/08/2018  9:04 AM  Temp    Pulse 83 03/08/2018  9:04 AM  Resp 19 03/24/2018  9:04 AM  SpO2 90 % 03/12/2018  9:04 AM    Last Pain:  Vitals:   04/05/2018 0904  TempSrc: Oral  PainSc: 0-No pain         Complications: No apparent anesthesia complications

## 2018-03-22 NOTE — Interval H&P Note (Signed)
History and Physical Interval Note:  04/06/2018 8:33 AM  Timothy Millin Sr.  has presented today for surgery, with the diagnosis of anemia  The various methods of treatment have been discussed with the patient and family. After consideration of risks, benefits and other options for treatment, the patient has consented to  Procedure(s): ESOPHAGOGASTRODUODENOSCOPY (EGD) WITH PROPOFOL (N/A) as a surgical intervention .  The patient's history has been reviewed, patient examined, no change in status, stable for surgery.  I have reviewed the patient's chart and labs.  Questions were answered to the patient's satisfaction.     Lear Ng

## 2018-03-22 NOTE — Anesthesia Postprocedure Evaluation (Signed)
Anesthesia Post Note  Patient: Timothy MINERVA Sr.  Procedure(s) Performed: ESOPHAGOGASTRODUODENOSCOPY (EGD) WITH PROPOFOL (N/A )     Patient location during evaluation: PACU Anesthesia Type: MAC Level of consciousness: awake and alert Pain management: pain level controlled Vital Signs Assessment: post-procedure vital signs reviewed and stable Respiratory status: spontaneous breathing, nonlabored ventilation, respiratory function stable and patient connected to nasal cannula oxygen Cardiovascular status: stable and blood pressure returned to baseline Postop Assessment: no apparent nausea or vomiting Anesthetic complications: no    Last Vitals:  Vitals:   03/15/2018 0945 03/08/2018 1232  BP: (!) 104/56 (!) 119/59  Pulse: 82 84  Resp: 18 18  Temp: 36.8 C 37.2 C  SpO2: 90% 91%    Last Pain:  Vitals:   03/18/2018 1232  TempSrc: Oral  PainSc:                  [name]

## 2018-03-22 NOTE — Progress Notes (Signed)
Spoke to family and patient regarding his cough and diet. I told them to only give the ordered clear liquid right now and not to give solid food.  I also told them that his cough was good to help him to continue to clear his airway. I discouraged a cough suppressant unless absolutely necessary. I told them it was beneficial for his to get up to the chair, movement is good to avoid anything settling in his lungs. They agreed to only give liquids and wanted to make sure he got the resource nutritional supplement.

## 2018-03-22 NOTE — Evaluation (Signed)
Clinical/Bedside Swallow Evaluation Patient Details  Name: Timothy VANWAGNER Sr. MRN: [id number] Date of Birth: [birth year]  Today's Date: 04/02/2018 Time: SLP Start Time (ACUTE ONLY): 1453 SLP Stop Time (ACUTE ONLY): 1512 SLP Time Calculation (min) (ACUTE ONLY): 19 min  Past Medical History:  Past Medical History:  Diagnosis Date  . Anemia   . Anginal pain ([place])   . Complication of anesthesia 1987   "didn't wake up for 5 days"  . COPD (chronic obstructive pulmonary disease) ([place])   . Coronary artery disease   . Exertional dyspnea 01/04/2012  . GI bleed due to NSAIDs 02/2012  . High cholesterol   . History of blood transfusion 1987; 11/25/2014; 03/12/2018   "emergent CABG; anemia; anemia"  . Hyperlipidemia   . Hypothyroidism   . Myocardial infarction ([place]) 1987  . Numbness and tingling in hands 01/04/2012   "right one goes to sleep on steering wheel when I'm driving; been that way long time; @ least 1 yr"  . Symptomatic anemia 03/15/2018  . Thyroid disease    Past Surgical History:  Past Surgical History:  Procedure Laterality Date  . CARDIAC CATHETERIZATION    . CARDIAC CATHETERIZATION N/A 09/10/2014   Procedure: Left Heart Cath and Cors/Grafts Angiography;  Surgeon: Peter M Martinique, MD;  Location: [facility];  Service: Cardiovascular;  Laterality: N/A;  . CATARACT EXTRACTION W/ INTRAOCULAR LENS  IMPLANT, BILATERAL Bilateral 2012  . COLONOSCOPY N/A 12/04/2014   Procedure: COLONOSCOPY;  Surgeon: Teena Irani, MD;  Location: [facility] ENDOSCOPY;  Service: Endoscopy;  Laterality: N/A;  . COLONOSCOPY W/ POLYPECTOMY  2011   "had to take it out in 3 pieces; all benign"  . CORONARY ANGIOPLASTY WITH STENT PLACEMENT  ? 1990's   "3"  . CORONARY ANGIOPLASTY WITH STENT PLACEMENT  01/04/2012   "2; total of 5"  . CORONARY ARTERY BYPASS GRAFT  1987   CABG X1  . ESOPHAGOGASTRODUODENOSCOPY  02/07/2012   Procedure: ESOPHAGOGASTRODUODENOSCOPY (EGD);  Surgeon: Wonda Horner, MD;  Location: [facility]  ENDOSCOPY;  Service: Endoscopy;  Laterality: N/A;  . ESOPHAGOGASTRODUODENOSCOPY (EGD) WITH PROPOFOL Left 11/26/2014   Procedure: ESOPHAGOGASTRODUODENOSCOPY (EGD) WITH PROPOFOL;  Surgeon: Arta Silence, MD;  Location: [facility] ENDOSCOPY;  Service: Endoscopy;  Laterality: Left;  . PERCUTANEOUS CORONARY STENT INTERVENTION (PCI-S) N/A 01/04/2012   Procedure: PERCUTANEOUS CORONARY STENT INTERVENTION (PCI-S);  Surgeon: Sinclair Grooms, MD;  Location: [facility] CATH LAB;  Service: Cardiovascular;  Laterality: N/A;  . WRIST FRACTURE SURGERY Left ~ 2009  . WRIST HARDWARE REMOVAL Left ~ 2010   "took steel bar out"   HPI:  Pt is an 84 yo male with PMH of chronic recurrent anemia secondary to GI bleed due to NSAID use, recurrent abdominal pain, HLD, CAD, hypothyroidism, lung cancer with previous radiation therapy ending in 2019, and severe COPD, who was admitted for symptomatic anemia and SOB. EGD 11/15 showed esophageal plaques concerning for candidiasis, widely patent Schatzkis ring, food residue in the stomach, and a small HH.    Assessment / Plan / Recommendation Clinical Impression  Pt does not present with overt s/s of aspiration, but he has the appearance of discoordination and his speech is significantly dysarthric. Family reports that this has been goin on for some time PTA, but they had attributed it to him not putting as much effort into his speech. When I talk with him, I have to ask him frequently to repeat himself to try to interpret what he says at the phrase to sentence level. Although  CT was negative for acute infarct, may wish to consider neurology consult. No diet changes recommended at this time, but given signs of possible dysphagia, recommend SLP f/u to assess tolerance as diet is advanced by MD. SLP Visit Diagnosis: Dysphagia, unspecified (R13.10)    Aspiration Risk  Mild aspiration risk    Diet Recommendation Thin liquid   Liquid Administration via: Cup;Straw Medication Administration: Whole  meds with puree Supervision: Staff to assist with self feeding Compensations: Slow rate;Small sips/bites Postural Changes: Seated upright at 90 degrees;Remain upright for at least 30 minutes after po intake    Other  Recommendations Oral Care Recommendations: Oral care BID   Follow up Recommendations (tba)      Frequency and Duration min 2x/week  2 weeks       Prognosis Prognosis for Safe Diet Advancement: Good      Swallow Study   General HPI: Pt is an 84 yo male with PMH of chronic recurrent anemia secondary to GI bleed due to NSAID use, recurrent abdominal pain, HLD, CAD, hypothyroidism, lung cancer with previous radiation therapy ending in 2019, and severe COPD, who was admitted for symptomatic anemia and SOB. EGD 11/15 showed esophageal plaques concerning for candidiasis, widely patent Schatzkis ring, food residue in the stomach, and a small HH.  Type of Study: Bedside Swallow Evaluation Previous Swallow Assessment: none in chart Diet Prior to this Study: Thin liquids(CLD) Temperature Spikes Noted: No Respiratory Status: Nasal cannula History of Recent Intubation: No Behavior/Cognition: Alert;Cooperative Oral Cavity Assessment: Dry Oral Care Completed by SLP: No Oral Cavity - Dentition: Adequate natural dentition Vision: Functional for self-feeding Self-Feeding Abilities: Needs assist Patient Positioning: Upright in bed Baseline Vocal Quality: Normal;Other (comment)(but speech significantly dysarthric) Volitional Cough: Congested    Oral/Motor/Sensory Function Overall Oral Motor/Sensory Function: Generalized oral weakness   Ice Chips Ice chips: Not tested   Thin Liquid Thin Liquid: Impaired Presentation: Straw Pharyngeal  Phase Impairments: Other (comments)(see clinical impressions)    Nectar Thick Nectar Thick Liquid: Not tested   Honey Thick Honey Thick Liquid: Not tested   Puree Puree: Impaired Presentation: Spoon Pharyngeal Phase Impairments: Other  (comments)(see clinical impressions)   Solid     Solid: Not tested      [name] 03/19/2018,4:34 PM  [name], M.A. [place] Acute Environmental education officer [phone] Office [phone]

## 2018-03-23 ENCOUNTER — Inpatient Hospital Stay (HOSPITAL_COMMUNITY): Payer: Medicare Other

## 2018-03-23 LAB — CBC
HEMATOCRIT: 26.8 % — AB (ref 39.0–52.0)
HEMOGLOBIN: 8.3 g/dL — AB (ref 13.0–17.0)
MCH: 25.9 pg — AB (ref 26.0–34.0)
MCHC: 31 g/dL (ref 30.0–36.0)
MCV: 83.5 fL (ref 80.0–100.0)
Platelets: 284 10*3/uL (ref 150–400)
RBC: 3.21 MIL/uL — ABNORMAL LOW (ref 4.22–5.81)
RDW: 15.7 % — ABNORMAL HIGH (ref 11.5–15.5)
WBC: 10.6 10*3/uL — ABNORMAL HIGH (ref 4.0–10.5)
nRBC: 0 % (ref 0.0–0.2)

## 2018-03-23 LAB — BASIC METABOLIC PANEL
Anion gap: 6 (ref 5–15)
BUN: 11 mg/dL (ref 8–23)
CHLORIDE: 106 mmol/L (ref 98–111)
CO2: 24 mmol/L (ref 22–32)
Calcium: 8.2 mg/dL — ABNORMAL LOW (ref 8.9–10.3)
Creatinine, Ser: 0.78 mg/dL (ref 0.61–1.24)
GFR calc Af Amer: >60 mL/min (ref >60)
GFR calc non Af Amer: >60 mL/min (ref >60)
Glucose, Bld: 100 mg/dL — ABNORMAL HIGH (ref 70–99)
POTASSIUM: 3.5 mmol/L (ref 3.5–5.1)
Sodium: 136 mmol/L (ref 135–145)

## 2018-03-23 MED ORDER — POTASSIUM CHLORIDE 2 MEQ/ML IV SOLN
INTRAVENOUS | Status: DC
  Administered 2018-03-23: 10:00:00 via INTRAVENOUS
  Filled 2018-03-23 (×2): qty 1000

## 2018-03-23 MED ORDER — PANTOPRAZOLE SODIUM 40 MG PO TBEC
40.0000 mg | DELAYED_RELEASE_TABLET | Freq: Every day | ORAL | Status: DC
  Administered 2018-03-24 – 2018-03-25 (×2): 40 mg via ORAL
  Filled 2018-03-23 (×2): qty 1

## 2018-03-23 MED ORDER — NYSTATIN 100000 UNIT/ML MT SUSP
5.0000 mL | Freq: Four times a day (QID) | OROMUCOSAL | Status: DC
  Administered 2018-03-23 – 2018-03-25 (×5): 500000 [IU] via ORAL
  Filled 2018-03-23 (×6): qty 5

## 2018-03-23 NOTE — Evaluation (Signed)
Occupational Therapy Evaluation Patient Details Name: Timothy CUARESMA Sr. MRN: [id number] DOB: 1933/06/28 Today's Date: 03/23/2018    History of Present Illness Pt is an 84 y/o male admitted secondary to symptomatic anemia. Per notes, will likely have EGD during admission. PMH includes MI, COPD, CAD s/p CABG, and history of AVM.    Clinical Impression   Pt admitted with the above diagnoses and presents with below problem list. Pt will benefit from continued acute OT to address the below listed deficits and maximize independence with basic ADLs prior to d/c to next venue. PTA pt was mod I with ADLs. Pt presents with generalized weakness, significantly decreased activity tolerance, and balance. Pt is currently mod-max +2 with LB ADLs and functional transfers, max A with UB ADLs. Of note, per chart review and conversation with pt's daughter it appears pt has gotten weaker in the past couple of days. Pt completed 2x sit<>stand, posterior lean noted. Encouraged breathing exercises especially while he was in standing. Discussed with pt and family general UB strengthening exercises to help prevent deconditioning.      Follow Up Recommendations  SNF    Equipment Recommendations  Other (comment)(TBD next venue)    Recommendations for Other Services       Precautions / Restrictions Precautions Precautions: Fall;Other (comment) Precaution Comments: watch O2 sats  Restrictions Weight Bearing Restrictions: No      Mobility Bed Mobility               General bed mobility comments: up in recliner  Transfers Overall transfer level: Needs assistance Equipment used: Rolling walker (2 wheeled) Transfers: Sit to/from Stand Sit to Stand: Max assist         General transfer comment: Max A from recliner 2x. Posterior lean, difficulty with anterior weightshift in standing. Mod A + rw to static stand for about 30 seconds.     Balance Overall balance assessment: Needs  assistance Sitting-balance support: No upper extremity supported;Feet supported Sitting balance-Leahy Scale: Fair     Standing balance support: No upper extremity supported;During functional activity Standing balance-Leahy Scale: Poor Standing balance comment: rw and mod A this session,                           ADL either performed or assessed with clinical judgement   ADL Overall ADL's : Needs assistance/impaired Eating/Feeding: Sitting;Moderate assistance   Grooming: Maximal assistance;Sitting   Upper Body Bathing: Maximal assistance;Sitting   Lower Body Bathing: +2 for physical assistance;Moderate assistance   Upper Body Dressing : Maximal assistance;Sitting   Lower Body Dressing: Maximal assistance;+2 for physical assistance;Sit to/from stand   Toilet Transfer: +2 for physical assistance;Stand-pivot;BSC;RW;Moderate assistance;Maximal assistance Toilet Transfer Details (indicate cue type and reason): heavy posterior lean, rw and mod A to stand Toileting- Clothing Manipulation and Hygiene: Moderate assistance;+2 for physical assistance;Sit to/from stand         General ADL Comments: Pt completed 2x sit<>stand with max A. Pt with heavy posterior lean in standing. difficulty with anterior weight shift. Spouse blocking rw as pt using this to pull up on.      Vision         Perception     Praxis      Pertinent Vitals/Pain Pain Assessment: Faces Faces Pain Scale: Hurts little more Pain Location: grimacing during coughing Pain Descriptors / Indicators: Grimacing Pain Intervention(s): Monitored during session;Limited activity within patient's tolerance     Hand Dominance  Extremity/Trunk Assessment Upper Extremity Assessment Upper Extremity Assessment: Generalized weakness   Lower Extremity Assessment Lower Extremity Assessment: Defer to PT evaluation;Generalized weakness   Cervical / Trunk Assessment Cervical / Trunk Assessment: Kyphotic    Communication Communication Communication: HOH   Cognition Arousal/Alertness: Awake/alert Behavior During Therapy: Flat affect;WFL for tasks assessed/performed Overall Cognitive Status: Difficult to assess                                 General Comments: speech low and garbled (dysarthic?), difficult to understand.   General Comments       Exercises     Shoulder Instructions      Home Living Family/patient expects to be discharged to:: Private residence Living Arrangements: Spouse/significant other Available Help at Discharge: Family;Available 24 hours/day Type of Home: House Home Access: Stairs to enter [employer] of Steps: 5 Entrance Stairs-Rails: Right;Left;Can reach both Home Layout: One level     Bathroom Shower/Tub: Tub/shower unit;Walk-in shower   Bathroom Toilet: Handicapped height     Home Equipment: Environmental consultant - 4 wheels;Cane - single point          Prior Functioning/Environment Level of Independence: Independent with assistive device(s)        Comments: Reports occasional use of cane.         OT Problem List: Decreased strength;Decreased activity tolerance;Impaired balance (sitting and/or standing);Decreased safety awareness;Decreased knowledge of use of DME or AE;Cardiopulmonary status limiting activity;Decreased knowledge of precautions;Pain      OT Treatment/Interventions: Self-care/ADL training;Therapeutic exercise;DME and/or AE instruction;Therapeutic activities;Patient/family education;Balance training    OT Goals(Current goals can be found in the care plan section) Acute Rehab OT Goals Patient Stated Goal: to be able to breathe better OT Goal Formulation: With patient/family Time For Goal Achievement: 04/06/18 Potential to Achieve Goals: Good ADL Goals Pt Will Perform Grooming: with min assist;sitting Pt Will Perform Upper Body Bathing: with min assist;sitting Pt Will Perform Lower Body Bathing: with min  assist;sit to/from stand Pt Will Transfer to Toilet: with min assist;ambulating Pt Will Perform Toileting - Clothing Manipulation and hygiene: with min assist;sit to/from stand Pt/caregiver will Perform Home Exercise Program: Both right and left upper extremity;With written HEP provided;With Supervision  OT Frequency: Min 2X/week   Barriers to D/C:            Co-evaluation              AM-PAC PT "6 Clicks" Daily Activity     Outcome Measure Help from another person eating meals?: A Lot Help from another person taking care of personal grooming?: A Lot Help from another person toileting, which includes using toliet, bedpan, or urinal?: Total Help from another person bathing (including washing, rinsing, drying)?: Total Help from another person to put on and taking off regular upper body clothing?: A Lot Help from another person to put on and taking off regular lower body clothing?: Total 6 Click Score: 9   End of Session Equipment Utilized During Treatment: Rolling walker;Oxygen  Activity Tolerance: Patient limited by fatigue Patient left: in chair;with call bell/phone within reach;with family/visitor present  OT Visit Diagnosis: Unsteadiness on feet (R26.81);Other abnormalities of gait and mobility (R26.89);Muscle weakness (generalized) (M62.81);Pain;Cognitive communication deficit (R41.841)                Time: 8099-8338 OT Time Calculation (min): 22 min Charges:  OT General Charges $OT Visit: 1 Visit OT Evaluation $OT Eval Moderate Complexity: 1 Mod  Tyrone Schimke, OT Acute Rehabilitation Services Pager: [phone] Office: [phone]   Hortencia Pilar 03/23/2018, 3:44 PM

## 2018-03-23 NOTE — Progress Notes (Signed)
  Speech Language Pathology Treatment: Dysphagia  Patient Details Name: Timothy SCALLON Sr. MRN: [id number] DOB: [birth year] Today's Date: 03/23/2018 Time: 6160-7371 SLP Time Calculation (min) (ACUTE ONLY): 25 min  Assessment / Plan / Recommendation Clinical Impression  Patient seen to assess toleration or current diet of thin liquids (full liquids) with specific concern from apparent aspiration of large bite of jello that patient took himself, leading to him coughing. SLP assessed patient's toleration of puree solids and thin liquids. He did not exhibit any overt s/s of aspiration or penetration with puree solids but did exhibit coughing and throat clearing with thin liquids (approximately 30% of the time). Patient was very distracted, very dysarthric and with multiple (6) family members in room, he was not able to focus on PO intake and would talk with food in mouth, look around room and required cues to attend to PO's.  Recommending MBS due to s/s of aspiration and determine safest PO diet.     HPI HPI: Pt is an 84 yo male with PMH of chronic recurrent anemia secondary to GI bleed due to NSAID use, recurrent abdominal pain, HLD, CAD, hypothyroidism, lung cancer with previous radiation therapy ending in 2019, and severe COPD, who was admitted for symptomatic anemia and SOB. EGD 11/15 showed esophageal plaques concerning for candidiasis, widely patent Schatzkis ring, food residue in the stomach, and a small HH.       SLP Plan  Continue with current plan of care;MBS       Recommendations  Diet recommendations: Thin liquid Liquids provided via: Cup Medication Administration: Whole meds with puree Compensations: Slow rate;Small sips/bites;Minimize environmental distractions Postural Changes and/or Swallow Maneuvers: Seated upright 90 degrees                Oral Care Recommendations: Oral care BID SLP Visit Diagnosis: Dysphagia, unspecified (R13.10) Plan: Continue with current plan of  care;MBS       Sonia Baller, MA, CCC-SLP Speech Therapy [facility] Acute Rehab

## 2018-03-23 NOTE — Progress Notes (Signed)
Pt up in chair.  Did not do CPT   Flutter in room.  Worked with pt on the flutter.  Will wait till pt in bed to start CPT

## 2018-03-23 NOTE — Progress Notes (Signed)
PROGRESS NOTE                                                                                                                                                                                                             Patient Demographics:    [name], is a 84 y.o. male, DOB - 16-Oct-1933, AST:[id number]  Admit date - 03/29/2018   Admitting Physician Elwyn Reach, MD  Outpatient Primary MD for the patient is Mayra Neer, MD  LOS - 3  Chief Complaint  Patient presents with  . Abnormal Lab  . Abdominal Pain       Brief Narrative  Timothy UMSCHEID Sr. is a 84 y.o. male with medical history significant of chronic recurrent anemia secondary to previous GI bleed, recurrent abdominal pain, hyperlipidemia, coronary artery disease, hypothyroidism who apparently was at his doctor's office today with complaint of exertional dyspnea, occasional weakness and dizziness, ankle swelling on and off,.  His lab work was done showing a hemoglobin of 6.  Patient denied any recent melena.  Denied any bright red blood per rectum. He did have epigastric pain and has H/O AVM bleeds in the past.   Subjective:    [name] today has, No headache, No chest pain, +ve epigastric abdominal pain - No Nausea, No new weakness tingling or numbness, No Cough - SOB.     Assessment  & Plan :      1.Symptomatic acute on chronic iron deficiency anemia due to blood loss related anemia with epigastric pain and history of AVM bleeds in the past.  Likely occurred subacute upper GI bleed -seen by GI underwent EGD on 03/25/2018 showing minimal gastritis and possible some esophagitis, placed on nystatin swish and swallow, and also transition to oral PPI, no further drop in H&H when accounted for dilution, 2 units of transfusion upon admission.  Per GI manage conservatively could have had AVM related small bowel loss.  Hold aspirin.  Be discharged on nystatin swish and swallow which has been started here along  with PPI.  2.  COPD.  Mild to moderate, supportive care continue.  3.  Hypothyroidism.  On Synthroid continue.  4.  Hypokalemia.  Replaced and stable.  5.  History of iron deficiency anemia.  Has a #1 above.  6.  Aspiration on 04/05/2018 by overzealous food administration by family, x-ray initially suggestive of possible aspiration pneumonia, low-grade fever and chills placed on Unasyn, counseled on pulmonary toiletry, encouraged to sit up in chair, continue  flutter valve, supportive care.  Repeat x-ray improved.  Advance Activity if stable discharge tomorrow.   Patient was seen on 03/15/2018 in the morning with family members in the room however note was missed in error.   Family Communication  :  Daughter and wife  Code Status :  Full  Disposition Plan  :  Tele  Consults  :  GI  Procedures  :    EGD 11/15 - Minimal gastritis. Minimal esophageal candidiasis. Nystatin swish and swallow. No source of anemia or abdominal pain seen. Suspect small bowel AVMs as source of anemia. Hold off on any further invasive GI procedures and manage conservatively. Clear liquid diet and advance as tolerated   DVT Prophylaxis  :   SCDs   Lab Results  Component Value Date   PLT 284 03/23/2018    Diet :  Diet Order            Diet clear liquid Room service appropriate? Yes; Fluid consistency: Thin  Diet effective now               Inpatient Medications Scheduled Meds: . famotidine  20 mg Oral BID  . feeding supplement  1 Container Oral TID BM  . ferrous sulfate  325 mg Oral BID WC  . levothyroxine  50 mcg Oral Q0600  . multivitamin with minerals  1 tablet Oral Daily  . pantoprazole (PROTONIX) IV  40 mg Intravenous Q12H  . rosuvastatin  10 mg Oral Daily  . sucralfate  1 g Oral BID   Continuous Infusions: . sodium chloride 50 mL/hr at 03/23/18 0017  . ampicillin-sulbactam (UNASYN) IV 1.5 g (03/23/18 0543)  . lactated ringers with kcl     PRN Meds:.acetaminophen,  guaiFENesin-dextromethorphan, ipratropium-albuterol, nitroGLYCERIN  Antibiotics  :   Anti-infectives (From admission, onward)   Start     Dose/Rate Route Frequency Ordered Stop   04/05/2018 1830  ampicillin-sulbactam (UNASYN) 1.5 g in sodium chloride 0.9 % 100 mL IVPB     1.5 g 200 mL/hr over 30 Minutes Intravenous Every 6 hours 03/30/2018 1730            Objective:   Vitals:   03/18/2018 2150 03/23/18 0000 03/23/18 0545 03/23/18 0806  BP: (!) 100/49  (!) 116/59   Pulse: 98  88   Resp: (!) 21  14   Temp: 98.6 F (37 C)  99.1 F (37.3 C)   TempSrc: Oral  Axillary   SpO2: 97% 96% 97% 90%  Weight:      Height:        Wt Readings from Last 3 Encounters:  03/17/2018 54.4 kg  01/14/18 54.8 kg  11/22/17 55.9 kg     Intake/Output Summary (Last 24 hours) at 03/23/2018 0918 Last data filed at 03/23/2018 0912 Gross per 24 hour  Intake 636.47 ml  Output 1200 ml  Net -563.53 ml     Physical Exam  Awake Alert, Oriented X 3, No new F.N deficits, Normal affect [place].AT,PERRAL Supple Neck,No JVD, No cervical lymphadenopathy appriciated.  Symmetrical Chest wall movement, Good air movement bilaterally, CTAB RRR,No Gallops, Rubs or new Murmurs, No Parasternal Heave +ve B.Sounds, Abd Soft, No tenderness, No organomegaly appriciated, No rebound - guarding or rigidity. No Cyanosis, Clubbing or edema, No new Rash or bruise    Data Review:    CBC Recent Labs  Lab 03/09/2018 1818 03/31/2018 2217 03/21/18 0953 03/21/18 1609 03/10/2018 0009 04/06/2018 0244 03/23/18 0413  WBC 8.3 7.2  --   --   --  12.6* 10.6*  HGB 6.2* 5.6* 9.0* 9.1* 9.2* 8.7* 8.3*  HCT 21.9* 19.1* 28.4* 28.9* 29.1* 28.9* 26.8*  PLT 410* 362  --   --   --  329 284  MCV 85.5 82.7  --   --   --  83.3 83.5  MCH 24.2* 24.2*  --   --   --  25.1* 25.9*  MCHC 28.3* 29.3*  --   --   --  30.1 31.0  RDW 15.8* 15.9*  --   --   --  15.2 15.7*  LYMPHSABS 0.8  --   --   --   --   --   --   MONOABS 1.2*  --   --   --   --   --   --     EOSABS 0.1  --   --   --   --   --   --   BASOSABS 0.1  --   --   --   --   --   --     Chemistries  Recent Labs  Lab 03/31/2018 1818 03/21/18 0953 03/14/2018 0244 03/23/18 0413  NA 137 138 138 136  K 3.3* 3.3* 3.4* 3.5  CL 105 108 106 106  CO2 23 23 24 24   GLUCOSE 113* 138* 106* 100*  BUN 15 9 8 11   CREATININE 0.84 0.85 0.90 0.78  CALCIUM 8.7* 8.4* 8.3* 8.2*  MG  --   --  2.0  --   AST 20  --   --   --   ALT 9  --   --   --   ALKPHOS 170*  --   --   --   BILITOT 0.4  --   --   --    ------------------------------------------------------------------------------------------------------------------ No results for input(s): CHOL, HDL, LDLCALC, TRIG, CHOLHDL, LDLDIRECT in the last 72 hours.  Lab Results  Component Value Date   HGBA1C 5.5 09/09/2014   ------------------------------------------------------------------------------------------------------------------ Recent Labs    03/27/2018 2217  TSH 3.145   ------------------------------------------------------------------------------------------------------------------ No results for input(s): VITAMINB12, FOLATE, FERRITIN, TIBC, IRON, RETICCTPCT in the last 72 hours.  Coagulation profile Recent Labs  Lab 03/16/2018 1818  INR 1.13    No results for input(s): DDIMER in the last 72 hours.  Cardiac Enzymes No results for input(s): CKMB, TROPONINI, MYOGLOBIN in the last 168 hours.  Invalid input(s): CK ------------------------------------------------------------------------------------------------------------------    Component Value Date/Time   BNP 141.8 (H) 09/09/2014 2022    Micro Results No results found for this or any previous visit (from the past 240 hour(s)).  Radiology Reports Ct Head Wo Contrast  Result Date: 03/21/2018 CLINICAL DATA:  New onset slurred speech. History of hyperlipidemia. EXAM: CT HEAD WITHOUT CONTRAST TECHNIQUE: Contiguous axial images were obtained from the base of the skull through the  vertex without intravenous contrast. COMPARISON:  None. FINDINGS: Mild motion degraded examination somewhat improved on repeat attempt. BRAIN: No intraparenchymal hemorrhage, mass effect nor midline shift. Mild disproportionate mesial temporal lobe atrophy. Bilateral inferior frontal lobe encephalomalacia. No hydrocephalus. Patchy supratentorial white matter hypodensities. No acute large vascular territory infarcts. No abnormal extra-axial fluid collections. Basal cisterns are patent. VASCULAR: Moderate calcific atherosclerosis of the carotid siphons. SKULL: No skull fracture. No significant scalp soft tissue swelling. SINUSES/ORBITS: Mild paranasal sinus mucosal thickening. Mastoid air cells are well aerated.The included ocular globes and orbital contents are non-suspicious. Status post bilateral ocular lens implants. OTHER: None. IMPRESSION: 1. No acute intracranial process on this motion degraded  examination. 2. Advanced mesial temporal lobe volume loss associated with neuro degenerative syndromes. 3. Bifrontal encephalomalacia compatible with TBI. 4. Moderate chronic small vessel ischemic changes. Electronically Signed   By: Elon Alas M.D.   On: 03/21/2018 23:28   Dg Chest Port 1 View  Result Date: 03/23/2018 CLINICAL DATA:  Shortness of breath EXAM: PORTABLE CHEST 1 VIEW COMPARISON:  03/09/2018 FINDINGS: Cardiac shadow is enlarged in size. Postsurgical changes are again identified. Diffuse interstitial opacity is again identified on the right stable from the prior exam. Similar changes are noted in the left base. Pleural based density is again noted in the left apex laterally stable from the prior exam. No new focal infiltrate is seen. IMPRESSION: Stable appearance of the chest when compare with the previous day. No new focal abnormality is seen. Electronically Signed   By: Inez Catalina M.D.   On: 03/23/2018 08:13   Dg Chest Port 1 View  Result Date: 03/21/2018 CLINICAL DATA:  84 year old male  with shortness of breath. EXAM: PORTABLE CHEST 1 VIEW COMPARISON:  Portable chest 03/30/2018 and earlier. FINDINGS: Portable AP semi upright view at 1651 hours. Chronic lung disease, with a combination of emphysema and fibrosis demonstrated on prior CTs. Superimposed acute interstitial opacity throughout the right lung and also likely at the left lung base. No superimposed pneumothorax. No pleural effusion is evident. Stable cardiac size and mediastinal contours. Visualized tracheal air column is within normal limits. Negative visible bowel gas pattern. Peripheral soft tissue thickening in the left upper lung may indicate progression of the hypermetabolic lesion seen on PET-CT 46/27/0350, uncertain. IMPRESSION: 1. Right greater than left interstitial opacity superimposed on chronic lung disease could reflect asymmetric pulmonary edema, viral or atypical respiratory infection. 2. Increased peripheral soft tissue thickening in the left upper lung may indicate progression of the hypermetabolic lesion seen on PET-CT 10/17/2017. 3. 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 Electronically Signed   By: Genevie Ann M.D.   On: 04/04/2018 17:03   Dg Chest Port 1 View  Result Date: 03/19/2018 CLINICAL DATA:  Initial evaluation for shortness of breath. History of COPD. EXAM: PORTABLE CHEST 1 VIEW COMPARISON:  Prior radiograph from 11/05/2017. FINDINGS: Median sternotomy wires noted. Underlying cardiomegaly, unchanged. Coronary stent noted. Mediastinal silhouette normal. Aortic atherosclerosis. Lungs hypoinflated. Underlying COPD with irregular biapical pleuroparenchymal scarring, right greater than left. Small layering bilateral pleural effusions, left greater than right. Associated bibasilar opacities likely atelectasis, although infiltrates could be considered in the correct clinical setting. No overt pulmonary edema. No pneumothorax. No acute osseous abnormality. IMPRESSION: 1. Small bilateral  pleural effusions, left greater than right. Associated bibasilar opacities likely reflect atelectasis, although infiltrates could be considered in the correct clinical setting. 2. Cardiomegaly with underlying COPD. 3. Aortic atherosclerosis. Electronically Signed   By: Jeannine Boga M.D.   On: 03/30/2018 18:48   Dg Duanne Limerick W/small Bowel  Result Date: 03/08/2018 CLINICAL DATA:  Generalized abdominal pain. History of COPD and biopsy-proven left lower lobe lung cancer. EXAM: UPPER GI SERIES WITH SMALL BOWEL FOLLOW-THROUGH FLUOROSCOPY TIME:  Fluoroscopy Time:  3 minutes 12 seconds Radiation Exposure Index (if provided by the fluoroscopic device): 217 mGy Number of Acquired Spot Images: 14 TECHNIQUE: Combined double contrast and single contrast upper GI series using effervescent crystals, thick barium, and thin barium. Subsequently, serial images of the small bowel were obtained including spot views of the terminal ileum. COMPARISON:  01/25/2018 CT abdomen.  10/17/2017 PET-CT. FINDINGS: Scout radiograph demonstrates no dilated small bowel loops.  Moderate colonic stool. No evidence of pneumatosis or pneumoperitoneum. No radiopaque nephrolithiasis. Evaluation is significantly limited by patient's mobility restrictions. Specifically, evaluation was limited to supine and anterior oblique imaging (patient unable to lie prone or stand for significant period of time). Esophageal motility is grossly normal. No evidence of hiatal hernia. Mild gastroesophageal reflux was elicited to the level of lower thoracic esophagus with water siphon test. Normal esophageal distensibility, with no evidence of esophageal mass, stricture or ulcer. No evidence of gastric fold thickening, ulcers or filling defects. Normal gastric emptying. Normal duodenal sweep with no evidence of duodenal fold thickening, strictures, ulcers or filling defects. Normal duodenal jejunal junction to the left of the spine. There is normal small bowel transit  time of 50 minutes. Normal caliber of the small bowel loops with no small bowel fold thickening, focal caliber changes or fixed positioning. No small bowel filling defects or fistulous. Terminal ileum appears normal. IMPRESSION: 1. Evaluation limited by patient mobility restrictions, see comments. 2. Mild gastroesophageal reflux elicited.  No hiatal hernia. 3. Otherwise unremarkable limited upper GI and small-bowel follow-through. Normal esophageal, gastric and small bowel motility. No evidence of peptic ulcer disease. No evidence of small-bowel obstruction. Electronically Signed   By: Ilona Sorrel M.D.   On: 03/08/2018 10:57    Time Spent in minutes  30   Lala Lund M.D on 03/23/2018 at 9:18 AM  To page go to [email or web address] - password [facility]

## 2018-03-23 NOTE — Progress Notes (Signed)
[facility] Progress Note  Timothy HORNBACK Sr. 84 y.o. 07-27-1933   Subjective: Sitting in bedside chair. Denies abdominal pain. Multiple family members in room.  Objective: Vital signs: Vitals:   03/23/18 0545 03/23/18 0806  BP: (!) 116/59   Pulse: 88   Resp: 14   Temp: 99.1 F (37.3 C)   SpO2: 97% 90%    Physical Exam: Gen: lethargic, elderly, frail, thin, no acute distress  HEENT: anicteric sclera CV: RRR Chest: CTA B Abd: soft, nontender, nondistended,+BS   Lab Results: Recent Labs    04/01/2018 0244 03/23/18 0413  NA 138 136  K 3.4* 3.5  CL 106 106  CO2 24 24  GLUCOSE 106* 100*  BUN 8 11  CREATININE 0.90 0.78  CALCIUM 8.3* 8.2*  MG 2.0  --    Recent Labs    03/08/2018 1818  AST 20  ALT 9  ALKPHOS 170*  BILITOT 0.4  PROT 7.3  ALBUMIN 3.1*   Recent Labs    03/19/2018 1818  03/13/2018 0244 03/23/18 0413  WBC 8.3   < > 12.6* 10.6*  NEUTROABS 6.1  --   --   --   HGB 6.2*   < > 8.7* 8.3*  HCT 21.9*   < > 28.9* 26.8*  MCV 85.5   < > 83.3 83.5  PLT 410*   < > 329 284   < > = values in this interval not displayed.      Assessment/Plan: Symptomatic anemia - Hgb stable at 8.3. S/P EGD unrevealing for a source. On Unasyn for possible aspiration pneumonia managed by Dr. Candiss Norse. Continue Nystatin and PPI. Advance diet per speech path recs. Will sign off. Call if questions.   [name] 03/23/2018, 2:23 PM  Questions please call [phone] ID: Timothy Millin Sr., male   DOB: 10/20/1933, 84 y.o.   MRN: [id number]

## 2018-03-23 NOTE — Progress Notes (Signed)
Patient's daughter-in-law came to the front desk and informed this RN that patient was spitting up blood in the suction tubing. RN went to bedside and patient was spitting up darker red mucous mixed with thick white mucus. Singh,MD notified at patient's bedside and he said it was the red jello and not blood. Oncoming dayshift Tammy RN aware.

## 2018-03-24 ENCOUNTER — Inpatient Hospital Stay (HOSPITAL_COMMUNITY): Payer: Medicare Other

## 2018-03-24 LAB — BASIC METABOLIC PANEL
Anion gap: 10 (ref 5–15)
BUN: 15 mg/dL (ref 8–23)
CHLORIDE: 107 mmol/L (ref 98–111)
CO2: 22 mmol/L (ref 22–32)
CREATININE: 0.79 mg/dL (ref 0.61–1.24)
Calcium: 8.5 mg/dL — ABNORMAL LOW (ref 8.9–10.3)
GFR calc Af Amer: >60 mL/min (ref >60)
GFR calc non Af Amer: >60 mL/min (ref >60)
GLUCOSE: 104 mg/dL — AB (ref 70–99)
Potassium: 3.5 mmol/L (ref 3.5–5.1)
Sodium: 139 mmol/L (ref 135–145)

## 2018-03-24 LAB — BRAIN NATRIURETIC PEPTIDE: B Natriuretic Peptide: 202.2 pg/mL — ABNORMAL HIGH (ref 0.0–100.0)

## 2018-03-24 LAB — CBC
HEMATOCRIT: 25.9 % — AB (ref 39.0–52.0)
HEMOGLOBIN: 7.8 g/dL — AB (ref 13.0–17.0)
MCH: 25.5 pg — ABNORMAL LOW (ref 26.0–34.0)
MCHC: 30.1 g/dL (ref 30.0–36.0)
MCV: 84.6 fL (ref 80.0–100.0)
Platelets: 185 10*3/uL (ref 150–400)
RBC: 3.06 MIL/uL — ABNORMAL LOW (ref 4.22–5.81)
RDW: 16 % — AB (ref 11.5–15.5)
WBC: 10.4 10*3/uL (ref 4.0–10.5)
nRBC: 0.2 % (ref 0.0–0.2)

## 2018-03-24 MED ORDER — POTASSIUM CHLORIDE 20 MEQ/15ML (10%) PO SOLN
40.0000 meq | Freq: Once | ORAL | Status: AC
  Administered 2018-03-24: 40 meq via ORAL
  Filled 2018-03-24: qty 30

## 2018-03-24 MED ORDER — FUROSEMIDE 10 MG/ML IJ SOLN
20.0000 mg | Freq: Once | INTRAMUSCULAR | Status: AC
  Administered 2018-03-24: 20 mg via INTRAVENOUS
  Filled 2018-03-24: qty 2

## 2018-03-24 NOTE — Progress Notes (Signed)
Placed pt on 12 L High Flow cannula

## 2018-03-24 NOTE — Progress Notes (Signed)
PROGRESS NOTE                                                                                                                                                                                                             Patient Demographics:    [name], is a 84 y.o. male, DOB - 1933-12-23, ZES:[id number]  Admit date - 03/09/2018   Admitting Physician Elwyn Reach, MD  Outpatient Primary MD for the patient is Mayra Neer, MD  LOS - 4  Chief Complaint  Patient presents with  . Abnormal Lab  . Abdominal Pain       Brief Narrative  Timothy WINDT Sr. is a 84 y.o. male with medical history significant of chronic recurrent anemia secondary to previous GI bleed, recurrent abdominal pain, hyperlipidemia, coronary artery disease, hypothyroidism who apparently was at his doctor's office today with complaint of exertional dyspnea, occasional weakness and dizziness, ankle swelling on and off,.  His lab work was done showing a hemoglobin of 6.  Patient denied any recent melena.  Denied any bright red blood per rectum. He did have epigastric pain and has H/O AVM bleeds in the past.   Subjective:   Patient in bed, appears comfortable, denies any headache, no fever, no chest pain or pressure, no shortness of breath , no abdominal pain. No focal weakness.     Assessment  & Plan :      1.Symptomatic acute on chronic iron deficiency anemia due to blood loss related anemia with epigastric pain and history of AVM bleeds in the past.  Likely occurred subacute upper GI bleed -seen by GI underwent EGD on 04/03/2018 showing minimal gastritis and possible some esophagitis, placed on nystatin swish and swallow, and also transition to oral PPI, no further drop in H&H when accounted for dilution, 2 units of transfusion upon admission.  Per GI manage conservatively could have had AVM related small bowel loss.  Hold aspirin.  Be discharged on nystatin swish and swallow which has been started  here along with PPI.  2.  COPD.  Mild to moderate, supportive care continue.  3.  Hypothyroidism.  On Synthroid continue.  4.  Hypokalemia.  Replaced and remains stable.  5.  History of iron deficiency anemia.  Please see #1 above.  6.  Aspiration on 03/21/2018 by overzealous food administration by family-by speech currently on a soft diet, going for modified barium swallow on 03/24/2018, continue Unasyn repeat x-ray stable clinically mild aspiration pneumonia now resolved.  Continue supportive care requested to sit up in chair use flutter valve for pulmonary toiletry, oxygen nebulizer treatments as needed.  Will check ambulatory pulse ox as well.      Family Communication  :  Daughter and wife  Code Status :  Full  Disposition Plan  : MedSurg  Consults  :  GI  Procedures  :    Modified barium swallow.    EGD 11/15 - Minimal gastritis. Minimal esophageal candidiasis. Nystatin swish and swallow. No source of anemia or abdominal pain seen. Suspect small bowel AVMs as source of anemia. Hold off on any further invasive GI procedures and manage conservatively. Clear liquid diet and advance as tolerated   DVT Prophylaxis  :   SCDs   Lab Results  Component Value Date   PLT 185 03/24/2018    Diet :  Diet Order            Diet full liquid Room service appropriate? Yes; Fluid consistency: Thin  Diet effective now               Inpatient Medications Scheduled Meds: . famotidine  20 mg Oral BID  . feeding supplement  1 Container Oral TID BM  . ferrous sulfate  325 mg Oral BID WC  . levothyroxine  50 mcg Oral Q0600  . multivitamin with minerals  1 tablet Oral Daily  . nystatin  5 mL Oral QID  . pantoprazole  40 mg Oral Daily  . rosuvastatin  10 mg Oral Daily  . sucralfate  1 g Oral BID   Continuous Infusions: . ampicillin-sulbactam (UNASYN) IV 1.5 g (03/24/18 0551)   PRN Meds:.acetaminophen, guaiFENesin-dextromethorphan, ipratropium-albuterol,  nitroGLYCERIN  Antibiotics  :   Anti-infectives (From admission, onward)   Start     Dose/Rate Route Frequency Ordered Stop   03/12/2018 1830  ampicillin-sulbactam (UNASYN) 1.5 g in sodium chloride 0.9 % 100 mL IVPB     1.5 g 200 mL/hr over 30 Minutes Intravenous Every 6 hours 03/08/2018 1730            Objective:   Vitals:   03/23/18 0806 03/23/18 1529 03/23/18 2223 03/24/18 0711  BP:  (!) 119/57 (!) 145/74 (!) 99/53  Pulse:  92 92 93  Resp:  18 (!) 22 20  Temp:  98.7 F (37.1 C) 98 F (36.7 C)   TempSrc:  Oral Oral   SpO2: 90% (!) 79% 92% (!) 81%  Weight:      Height:        Wt Readings from Last 3 Encounters:  03/16/2018 54.4 kg  01/14/18 54.8 kg  11/22/17 55.9 kg     Intake/Output Summary (Last 24 hours) at 03/24/2018 0851 Last data filed at 03/23/2018 0912 Gross per 24 hour  Intake -  Output 500 ml  Net -500 ml     Physical Exam  Awake , No new F.N deficits, Normal affect [place].AT,PERRAL Supple Neck,No JVD, No cervical lymphadenopathy appriciated.  Symmetrical Chest wall movement, Good air movement bilaterally, CTAB RRR,No Gallops, Rubs or new Murmurs, No Parasternal Heave +ve B.Sounds, Abd Soft, No tenderness, No organomegaly appriciated, No rebound - guarding or rigidity. No Cyanosis, Clubbing or edema, No new Rash or bruise    Data Review:    CBC Recent Labs  Lab 03/16/2018 1818 03/29/2018 2217  03/21/18 1609 04/03/2018 0009 03/25/2018 0244 03/23/18 0413 03/24/18 0313  WBC 8.3 7.2  --   --   --  12.6* 10.6* 10.4  HGB 6.2* 5.6*   < >  9.1* 9.2* 8.7* 8.3* 7.8*  HCT 21.9* 19.1*   < > 28.9* 29.1* 28.9* 26.8* 25.9*  PLT 410* 362  --   --   --  329 284 185  MCV 85.5 82.7  --   --   --  83.3 83.5 84.6  MCH 24.2* 24.2*  --   --   --  25.1* 25.9* 25.5*  MCHC 28.3* 29.3*  --   --   --  30.1 31.0 30.1  RDW 15.8* 15.9*  --   --   --  15.2 15.7* 16.0*  LYMPHSABS 0.8  --   --   --   --   --   --   --   MONOABS 1.2*  --   --   --   --   --   --   --   EOSABS 0.1   --   --   --   --   --   --   --   BASOSABS 0.1  --   --   --   --   --   --   --    < > = values in this interval not displayed.    Chemistries  Recent Labs  Lab 04/04/2018 1818 03/21/18 0953 03/24/2018 0244 03/23/18 0413 03/24/18 0313  NA 137 138 138 136 139  K 3.3* 3.3* 3.4* 3.5 3.5  CL 105 108 106 106 107  CO2 23 23 24 24 22   GLUCOSE 113* 138* 106* 100* 104*  BUN 15 9 8 11 15   CREATININE 0.84 0.85 0.90 0.78 0.79  CALCIUM 8.7* 8.4* 8.3* 8.2* 8.5*  MG  --   --  2.0  --   --   AST 20  --   --   --   --   ALT 9  --   --   --   --   ALKPHOS 170*  --   --   --   --   BILITOT 0.4  --   --   --   --    ------------------------------------------------------------------------------------------------------------------ No results for input(s): CHOL, HDL, LDLCALC, TRIG, CHOLHDL, LDLDIRECT in the last 72 hours.  Lab Results  Component Value Date   HGBA1C 5.5 09/09/2014   ------------------------------------------------------------------------------------------------------------------ No results for input(s): TSH, T4TOTAL, T3FREE, THYROIDAB in the last 72 hours.  Invalid input(s): FREET3 ------------------------------------------------------------------------------------------------------------------ No results for input(s): VITAMINB12, FOLATE, FERRITIN, TIBC, IRON, RETICCTPCT in the last 72 hours.  Coagulation profile Recent Labs  Lab 03/12/2018 1818  INR 1.13    No results for input(s): DDIMER in the last 72 hours.  Cardiac Enzymes No results for input(s): CKMB, TROPONINI, MYOGLOBIN in the last 168 hours.  Invalid input(s): CK ------------------------------------------------------------------------------------------------------------------    Component Value Date/Time   BNP 202.2 (H) 03/24/2018 2536    Micro Results No results found for this or any previous visit (from the past 240 hour(s)).  Radiology Reports Ct Head Wo Contrast  Result Date: 03/21/2018 CLINICAL  DATA:  New onset slurred speech. History of hyperlipidemia. EXAM: CT HEAD WITHOUT CONTRAST TECHNIQUE: Contiguous axial images were obtained from the base of the skull through the vertex without intravenous contrast. COMPARISON:  None. FINDINGS: Mild motion degraded examination somewhat improved on repeat attempt. BRAIN: No intraparenchymal hemorrhage, mass effect nor midline shift. Mild disproportionate mesial temporal lobe atrophy. Bilateral inferior frontal lobe encephalomalacia. No hydrocephalus. Patchy supratentorial white matter hypodensities. No acute large vascular territory infarcts. No abnormal extra-axial fluid collections. Basal cisterns are patent. VASCULAR:  Moderate calcific atherosclerosis of the carotid siphons. SKULL: No skull fracture. No significant scalp soft tissue swelling. SINUSES/ORBITS: Mild paranasal sinus mucosal thickening. Mastoid air cells are well aerated.The included ocular globes and orbital contents are non-suspicious. Status post bilateral ocular lens implants. OTHER: None. IMPRESSION: 1. No acute intracranial process on this motion degraded examination. 2. Advanced mesial temporal lobe volume loss associated with neuro degenerative syndromes. 3. Bifrontal encephalomalacia compatible with TBI. 4. Moderate chronic small vessel ischemic changes. Electronically Signed   By: Elon Alas M.D.   On: 03/21/2018 23:28   Dg Chest Port 1 View  Result Date: 03/23/2018 CLINICAL DATA:  Shortness of breath EXAM: PORTABLE CHEST 1 VIEW COMPARISON:  03/12/2018 FINDINGS: Cardiac shadow is enlarged in size. Postsurgical changes are again identified. Diffuse interstitial opacity is again identified on the right stable from the prior exam. Similar changes are noted in the left base. Pleural based density is again noted in the left apex laterally stable from the prior exam. No new focal infiltrate is seen. IMPRESSION: Stable appearance of the chest when compare with the previous day. No new  focal abnormality is seen. Electronically Signed   By: Inez Catalina M.D.   On: 03/23/2018 08:13   Dg Chest Port 1 View  Result Date: 03/29/2018 CLINICAL DATA:  84 year old male with shortness of breath. EXAM: PORTABLE CHEST 1 VIEW COMPARISON:  Portable chest 03/10/2018 and earlier. FINDINGS: Portable AP semi upright view at 1651 hours. Chronic lung disease, with a combination of emphysema and fibrosis demonstrated on prior CTs. Superimposed acute interstitial opacity throughout the right lung and also likely at the left lung base. No superimposed pneumothorax. No pleural effusion is evident. Stable cardiac size and mediastinal contours. Visualized tracheal air column is within normal limits. Negative visible bowel gas pattern. Peripheral soft tissue thickening in the left upper lung may indicate progression of the hypermetabolic lesion seen on PET-CT 63/14/9702, uncertain. IMPRESSION: 1. Right greater than left interstitial opacity superimposed on chronic lung disease could reflect asymmetric pulmonary edema, viral or atypical respiratory infection. 2. Increased peripheral soft tissue thickening in the left upper lung may indicate progression of the hypermetabolic lesion seen on PET-CT 10/17/2017. 3. 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 Electronically Signed   By: Genevie Ann M.D.   On: 03/25/2018 17:03   Dg Chest Port 1 View  Result Date: 04/06/2018 CLINICAL DATA:  Initial evaluation for shortness of breath. History of COPD. EXAM: PORTABLE CHEST 1 VIEW COMPARISON:  Prior radiograph from 11/05/2017. FINDINGS: Median sternotomy wires noted. Underlying cardiomegaly, unchanged. Coronary stent noted. Mediastinal silhouette normal. Aortic atherosclerosis. Lungs hypoinflated. Underlying COPD with irregular biapical pleuroparenchymal scarring, right greater than left. Small layering bilateral pleural effusions, left greater than right. Associated bibasilar opacities likely  atelectasis, although infiltrates could be considered in the correct clinical setting. No overt pulmonary edema. No pneumothorax. No acute osseous abnormality. IMPRESSION: 1. Small bilateral pleural effusions, left greater than right. Associated bibasilar opacities likely reflect atelectasis, although infiltrates could be considered in the correct clinical setting. 2. Cardiomegaly with underlying COPD. 3. Aortic atherosclerosis. Electronically Signed   By: Jeannine Boga M.D.   On: 03/19/2018 18:48   Dg Duanne Limerick W/small Bowel  Result Date: 03/08/2018 CLINICAL DATA:  Generalized abdominal pain. History of COPD and biopsy-proven left lower lobe lung cancer. EXAM: UPPER GI SERIES WITH SMALL BOWEL FOLLOW-THROUGH FLUOROSCOPY TIME:  Fluoroscopy Time:  3 minutes 12 seconds Radiation Exposure Index (if provided by the fluoroscopic device): 217 mGy Number of  Acquired Spot Images: 14 TECHNIQUE: Combined double contrast and single contrast upper GI series using effervescent crystals, thick barium, and thin barium. Subsequently, serial images of the small bowel were obtained including spot views of the terminal ileum. COMPARISON:  01/25/2018 CT abdomen.  10/17/2017 PET-CT. FINDINGS: Scout radiograph demonstrates no dilated small bowel loops. Moderate colonic stool. No evidence of pneumatosis or pneumoperitoneum. No radiopaque nephrolithiasis. Evaluation is significantly limited by patient's mobility restrictions. Specifically, evaluation was limited to supine and anterior oblique imaging (patient unable to lie prone or stand for significant period of time). Esophageal motility is grossly normal. No evidence of hiatal hernia. Mild gastroesophageal reflux was elicited to the level of lower thoracic esophagus with water siphon test. Normal esophageal distensibility, with no evidence of esophageal mass, stricture or ulcer. No evidence of gastric fold thickening, ulcers or filling defects. Normal gastric emptying. Normal  duodenal sweep with no evidence of duodenal fold thickening, strictures, ulcers or filling defects. Normal duodenal jejunal junction to the left of the spine. There is normal small bowel transit time of 50 minutes. Normal caliber of the small bowel loops with no small bowel fold thickening, focal caliber changes or fixed positioning. No small bowel filling defects or fistulous. Terminal ileum appears normal. IMPRESSION: 1. Evaluation limited by patient mobility restrictions, see comments. 2. Mild gastroesophageal reflux elicited.  No hiatal hernia. 3. Otherwise unremarkable limited upper GI and small-bowel follow-through. Normal esophageal, gastric and small bowel motility. No evidence of peptic ulcer disease. No evidence of small-bowel obstruction. Electronically Signed   By: Ilona Sorrel M.D.   On: 03/08/2018 10:57    Time Spent in minutes  30   Lala Lund M.D on 03/24/2018 at 8:51 AM  To page go to [email or web address] - password [email or web address]

## 2018-03-24 NOTE — Progress Notes (Signed)
Modified Barium Swallow Progress Note  Patient Details  Name: Timothy GORR Sr. MRN: [id number] Date of Birth: 28-Feb-1934  Today's Date: 03/24/2018  Modified Barium Swallow completed.  Full report located under Chart Review in the Imaging Section.  Brief recommendations include the following:  Clinical Impression  Patient presents with moderate oropharyngeal dysphagia with a reduced cricopharyngeal opening. He required verbal cueing to swallow. He had adequate mastication with poor bolus formation, reduced anterior to posterior transfer and reduced lingual elevation evidenced by mild oral residue.  With all consistencies the swallow was triggered at the valleculae with no epiglottic inversion (epiglottis cannot clear cervical vertebrae) resulting in vallecula pooling (improved by liquid wash). Patient is attempting to compensate absent epiglottic inversion with a hard swallow/pharyngeal squeezing). Pyriform sinus pooling was noted due to reduced cricopharyngus opeing and early closure. There was only one incidence of layngeal penetration (10% of bolus) that went below the cords and then ejected fully. Recommend Dys 2 diet with thin liquids. Compensatory strategies to include 1. reduce environmental distractions, 2. full assistance to verbally cue patient to swallow, 3. present small sips and bites (alternating). Notified RN of recommendations. Completed education at bedside with large family, specifically pt's daughter, 2 sons and wife. Patient remains a moderate risk for aspiration. Speech therapy to follow up for diet tolerance, review aspiration risk and compensatory training.    Swallow Evaluation Recommendations       SLP Diet Recommendations: Dysphagia 2 (Fine chop) solids;Thin liquid   Liquid Administration via: Cup;No straw   Medication Administration: Whole meds with puree   Supervision: Staff to assist with self feeding;Full supervision/cueing for compensatory strategies   Compensations: Slow rate;Small sips/bites;Follow solids with liquid   Postural Changes: Remain semi-upright after after feeds/meals (Comment);Seated upright at 90 degrees   Oral Care Recommendations: Oral care BID     [place], MA, CCC-SLP 03/24/2018 12:17 PM

## 2018-03-25 ENCOUNTER — Inpatient Hospital Stay (HOSPITAL_COMMUNITY): Payer: Medicare Other

## 2018-03-25 ENCOUNTER — Encounter (HOSPITAL_COMMUNITY): Payer: Self-pay | Admitting: Gastroenterology

## 2018-03-25 DIAGNOSIS — G1229 Other motor neuron disease: Secondary | ICD-10-CM

## 2018-03-25 DIAGNOSIS — E43 Unspecified severe protein-calorie malnutrition: Secondary | ICD-10-CM

## 2018-03-25 LAB — BASIC METABOLIC PANEL
Anion gap: 5 (ref 5–15)
BUN: 21 mg/dL (ref 8–23)
CALCIUM: 8 mg/dL — AB (ref 8.9–10.3)
CHLORIDE: 103 mmol/L (ref 98–111)
CO2: 25 mmol/L (ref 22–32)
CREATININE: 0.78 mg/dL (ref 0.61–1.24)
Glucose, Bld: 135 mg/dL — ABNORMAL HIGH (ref 70–99)
Potassium: 3.1 mmol/L — ABNORMAL LOW (ref 3.5–5.1)
SODIUM: 133 mmol/L — AB (ref 135–145)

## 2018-03-25 LAB — RETICULOCYTES
Immature Retic Fract: 22.1 % — ABNORMAL HIGH (ref 2.3–15.9)
RBC.: 3 MIL/uL — AB (ref 4.22–5.81)
RETIC COUNT ABSOLUTE: 58.2 10*3/uL (ref 19.0–186.0)
RETIC CT PCT: 2 % (ref 0.4–3.1)

## 2018-03-25 LAB — HEMOGLOBIN AND HEMATOCRIT, BLOOD
HEMATOCRIT: 25.1 % — AB (ref 39.0–52.0)
HEMOGLOBIN: 7.7 g/dL — AB (ref 13.0–17.0)

## 2018-03-25 LAB — IRON AND TIBC
Iron: 34 ug/dL — ABNORMAL LOW (ref 45–182)
SATURATION RATIOS: 15 % — AB (ref 17.9–39.5)
TIBC: 231 ug/dL — ABNORMAL LOW (ref 250–450)
UIBC: 197 ug/dL

## 2018-03-25 LAB — GLUCOSE, CAPILLARY: Glucose-Capillary: 178 mg/dL — ABNORMAL HIGH (ref 70–99)

## 2018-03-25 LAB — FOLATE: Folate: 13.5 ng/mL (ref >5.9)

## 2018-03-25 LAB — VITAMIN B12: VITAMIN B 12: 334 pg/mL (ref 180–914)

## 2018-03-25 LAB — FERRITIN: Ferritin: 175 ng/mL (ref 24–336)

## 2018-03-25 MED ORDER — LACTATED RINGERS IV SOLN
INTRAVENOUS | Status: AC
  Administered 2018-03-25: 07:00:00 via INTRAVENOUS

## 2018-03-25 MED ORDER — ENSURE ENLIVE PO LIQD
237.0000 mL | Freq: Two times a day (BID) | ORAL | Status: DC
  Administered 2018-03-25 (×2): 237 mL via ORAL

## 2018-03-25 MED ORDER — SODIUM CHLORIDE 0.9 % IV SOLN
8.0000 mg/h | INTRAVENOUS | Status: DC
  Administered 2018-03-25: 8 mg/h via INTRAVENOUS
  Filled 2018-03-25 (×4): qty 80

## 2018-03-25 MED ORDER — DEXTROSE 5 % IV SOLN
INTRAVENOUS | Status: DC
  Administered 2018-03-25: 16:00:00 via INTRAVENOUS

## 2018-03-25 MED ORDER — SODIUM CHLORIDE 0.9% IV SOLUTION
Freq: Once | INTRAVENOUS | Status: AC
  Administered 2018-03-25: via INTRAVENOUS

## 2018-03-25 MED ORDER — SODIUM CHLORIDE 0.9 % IV BOLUS
1000.0000 mL | Freq: Once | INTRAVENOUS | Status: AC
  Administered 2018-03-25: 1000 mL via INTRAVENOUS

## 2018-03-25 MED ORDER — POTASSIUM CHLORIDE 10 MEQ/100ML IV SOLN
10.0000 meq | INTRAVENOUS | Status: DC
  Administered 2018-03-25 (×2): 10 meq via INTRAVENOUS
  Filled 2018-03-25 (×2): qty 100

## 2018-03-25 NOTE — Progress Notes (Signed)
Pt came to [facility] for scan in chart under FYI there is a mri contraindication that states endo clip not mri safe, in the mri safety list there  Are 2 that are unsafe spoke with DR [facility] Radiologist and he said no to mri unless patients Dr can prove otherwise that the endo clip is safe. Op notes do not specify which type of endo clip placed in 2016 and no mri since 2010.

## 2018-03-25 NOTE — Progress Notes (Signed)
Physical Therapy Treatment Patient Details Name: Timothy PASQUARELLI Sr. MRN: [id number] DOB: 01/16/1934 Today's Date: 03/25/2018    History of Present Illness Pt is an 84 y/o male admitted secondary to symptomatic anemia. Per notes, will likely have EGD during admission. PMH includes MI, COPD, CAD s/p CABG, and history of AVM.     PT Comments    Pt admitted with above diagnosis. Pt currently with functional limitations due to balance and endurance deficits. Pt was able to sit EOB with min to max assist for 5 min with right and posterior lean.  Pt also transferred to chair with +2 mod to max assist squat  Pivot.  Pt will benefit from skilled PT to increase their independence and safety with mobility to allow discharge to the venue listed below.     Follow Up Recommendations  SNF;Supervision for mobility/OOB(may be able to progress to home with HHPT )     Equipment Recommendations  None recommended by PT    Recommendations for Other Services OT consult     Precautions / Restrictions Precautions Precautions: Fall;Other (comment) Precaution Comments: watch O2 sats  Restrictions Weight Bearing Restrictions: No    Mobility  Bed Mobility Overal bed mobility: Needs Assistance Bed Mobility: Supine to Sit     Supine to sit: Mod assist     General bed mobility comments: Assist for trunk elevation  Transfers Overall transfer level: Needs assistance Equipment used: Rolling walker (2 wheeled) Transfers: Sit to/from [employer] Sit to Stand: Max assist;+2 physical assistance   Squat pivot transfers: Mod assist;+2 physical assistance     General transfer comment: Max A for power up.  Use of pad to assist.   Posterior lean, difficulty with anterior weightshift in standing. Mod A to squat and pivot to recliner with use of pad to assist to turn.   Ambulation/Gait             General Gait Details: Unable to progress ambulation    Stairs              Wheelchair Mobility    Modified Rankin (Stroke Patients Only)       Balance Overall balance assessment: Needs assistance Sitting-balance support: No upper extremity supported;Feet supported Sitting balance-Leahy Scale: Poor Sitting balance - Comments: Pt needs min to max assist for sitting 5 min.   Postural control: Posterior lean;Right lateral lean Standing balance support: During functional activity;Bilateral upper extremity supported Standing balance-Leahy Scale: Poor Standing balance comment: max +2 assist for partial stand                            Cognition Arousal/Alertness: Awake/alert Behavior During Therapy: Flat affect;WFL for tasks assessed/performed Overall Cognitive Status: Difficult to assess                                 General Comments: speech low and garbled (dysarthic?), difficult to understand.      Exercises General Exercises - Lower Extremity Long Arc Quad: AROM;Both;10 reps;Seated    General Comments General comments (skin integrity, edema, etc.): bed wet with urine on arrival.  Condom cath kinked. Cleaned pt and changed linens.        Pertinent Vitals/Pain Pain Assessment: Faces Faces Pain Scale: Hurts little more Pain Location: grimacing during coughing and with LE movement Pain Descriptors / Indicators: Grimacing Pain Intervention(s): Limited activity within patient's tolerance;Monitored during session;Repositioned  Home Living                      Prior Function            PT Goals (current goals can now be found in the care plan section) Acute Rehab PT Goals Patient Stated Goal: to be able to breathe better Progress towards PT goals: Progressing toward goals    Frequency    Min 2X/week      PT Plan Current plan remains appropriate    Co-evaluation              AM-PAC PT "6 Clicks" Daily Activity  Outcome Measure  Difficulty turning over in bed (including adjusting  bedclothes, sheets and blankets)?: A Little Difficulty moving from lying on back to sitting on the side of the bed? : Unable Difficulty sitting down on and standing up from a chair with arms (e.g., wheelchair, bedside commode, etc,.)?: Unable Help needed moving to and from a bed to chair (including a wheelchair)?: A Little Help needed walking in hospital room?: A Little Help needed climbing 3-5 steps with a railing? : A Lot 6 Click Score: 13    End of Session Equipment Utilized During Treatment: Gait belt Activity Tolerance: Patient limited by fatigue Patient left: in chair;with call bell/phone within reach;with family/visitor present Nurse Communication: Mobility status PT Visit Diagnosis: Unsteadiness on feet (R26.81);Muscle weakness (generalized) (M62.81)     Time: 0160-1093 PT Time Calculation (min) (ACUTE ONLY): 25 min  Charges:  $Therapeutic Exercise: 8-22 mins $Therapeutic Activity: 8-22 mins                     [place] Pager:  [phone]  Office:  Christain Mcraney 03/25/2018, 4:36 PM

## 2018-03-25 NOTE — Progress Notes (Signed)
PT Cancellation Note  Patient Details Name: Timothy CLINARD Sr. MRN: [id number] DOB: [birth year]   Cancelled Treatment:    Reason Eval/Treat Not Completed: Other (comment)(Pt was eating breakfast, family refused for pt due to wanting him to eat now because he is to have MRI.)   Denice Paradise 03/25/2018, 10:55 AM [place] Pager:  [phone]  Office:  [phone]

## 2018-03-25 NOTE — Progress Notes (Addendum)
Pharmacy Antibiotic Note  Timothy NHAN Sr. is a 84 y.o. male admitted on 04/01/2018 with aspiration pneumonia.  Pharmacy has been consulted for Unasyn dosing. Afebrile, Tm 98.9, WBC 10.4<10.6 , SCr 0.78, Crcl 54.8 ml/min.    Plan: Continue Unasyn 1.5g every 6 hours. Monitor renal function & clinical status daily.  Height: 5\' 8"  (172.7 cm) Weight: 120 lb (54.4 kg) IBW/kg (Calculated) : 68.4  Temp (24hrs), Avg:98.8 F (37.1 C), Min:98.6 F (37 C), Max:98.9 F (37.2 C)  Recent Labs  Lab 03/18/2018 1818 03/30/2018 2217 03/21/18 0953 03/25/2018 0244 03/23/18 0413 03/24/18 0313 03/25/18 0019  WBC 8.3 7.2  --  12.6* 10.6* 10.4  --   CREATININE 0.84  --  0.85 0.90 0.78 0.79 0.78    Estimated Creatinine Clearance: 54.8 mL/min (by C-G formula based on SCr of 0.78 mg/dL).    Allergies  Allergen Reactions  . Ativan [Lorazepam]   . Ibuprofen Other (See Comments)    Internal bleeding  . Morphine And Related Nausea And Vomiting  . Atorvastatin     Muscle pain    Antimicrobials this admission:  11/15 Unasyn>>   Microbiology results: none  Thank you for allowing pharmacy to be a part of this patient's care.  Ladoris Gene 03/25/2018 2:16 PM   I discussed / reviewed the pharmacy note by Ladoris Gene, Pharm D Student and I agree with the resident's findings and plans as documented.  Nicole Cella, RPh Clinical Pharmacist Please check AMION for all [place] phone numbers After 10:00 PM, call [place] [phone] 03/25/2018 3:13 PM

## 2018-03-25 NOTE — Progress Notes (Signed)
PROGRESS NOTE                                                                                                                                                                                                             Patient Demographics:    [name], is a 84 y.o. male, DOB - [birth year], MVE:[id number]  Admit date - 03/15/2018   Admitting Physician Elwyn Reach, MD  Outpatient Primary MD for the patient is Mayra Neer, MD  LOS - 5  Chief Complaint  Patient presents with  . Abnormal Lab  . Abdominal Pain       Brief Narrative  Timothy MAPEL Sr. is a 84 y.o. male with medical history significant of chronic recurrent anemia secondary to previous GI bleed, recurrent abdominal pain, hyperlipidemia, coronary artery disease, hypothyroidism who apparently was at his doctor's office today with complaint of exertional dyspnea, occasional weakness and dizziness, ankle swelling on and off,.  His lab work was done showing a hemoglobin of 6.  Patient denied any recent melena.  Denied any bright red blood per rectum. He did have epigastric pain and has H/O AVM bleeds in the past.   Subjective:   Patient in bed, appears comfortable, denies any headache, no fever, no chest pain or pressure, no shortness of breath but does have a cough, no abdominal pain. No focal weakness.    Assessment  & Plan :      1.Symptomatic acute on chronic iron deficiency anemia due to blood loss related anemia with epigastric pain and history of AVM bleeds in the past.  Likely occurred subacute upper GI bleed -seen by GI underwent EGD on 03/21/2018 showing minimal gastritis and possible some esophagitis, placed on nystatin swish and swallow, and also transition to oral PPI, no further drop in H&H when accounted for dilution, 2 units of transfusion upon admission.  Per GI manage conservatively could have had AVM related small bowel loss.  Holding aspirin.  Be discharged on nystatin swish and swallow  which has been started here along with PPI.  2.  COPD.  Mild to moderate, supportive care continue.  3.  Hypothyroidism.  On Synthroid continue.  4.  Hypokalemia.  Replaced and remains stable.  5.  History of iron deficiency anemia.  Please see #1 above.  6.  Aspiration on 03/19/2018 by overzealous food administration by family-by speech currently on a soft diet -has been seen by speech, on Unasyn, underwent modified barium swallow with some oropharyngeal and epiglottic  abnormalities were noted, he also has a chronic dysarthria per family for several months.  No previous documented history of stroke, no history of Parkinson's etc.  Cannot do MRI due to a noncompatible Endo Clip placed in 2016 will repeat CT scan of the head to rule out a new or old CVA.  Initial CT scan on 03/12/2018 was negative.  Now aspirin has to be held due to #1 above.     Family Communication  :  Daughter and wife and multiple family members multiple times.  Code Status :  Full  Disposition Plan  : MedSurg  Consults  :  GI  Procedures  :    Modified barium swallow.  Patient presents with moderate oropharyngeal dysphagia with a reduced cricopharyngeal opening.  EGD 11/15 - Minimal gastritis. Minimal esophageal candidiasis. Nystatin swish and swallow. No source of anemia or abdominal pain seen. Suspect small bowel AVMs as source of anemia. Hold off on any further invasive GI procedures and manage conservatively. Clear liquid diet and advance as tolerated   DVT Prophylaxis  :   SCDs   Lab Results  Component Value Date   PLT 185 03/24/2018    Diet :  Diet Order            DIET DYS 2 Room service appropriate? Yes; Fluid consistency: Thin  Diet effective now               Inpatient Medications Scheduled Meds: . famotidine  20 mg Oral BID  . feeding supplement  1 Container Oral TID BM  . ferrous sulfate  325 mg Oral BID WC  . levothyroxine  50 mcg Oral Q0600  . multivitamin with minerals  1 tablet  Oral Daily  . nystatin  5 mL Oral QID  . pantoprazole  40 mg Oral Daily  . rosuvastatin  10 mg Oral Daily  . sucralfate  1 g Oral BID   Continuous Infusions: . ampicillin-sulbactam (UNASYN) IV 1.5 g (03/25/18 0659)  . lactated ringers 50 mL/hr at 03/25/18 0657   PRN Meds:.acetaminophen, guaiFENesin-dextromethorphan, ipratropium-albuterol, nitroGLYCERIN  Antibiotics  :   Anti-infectives (From admission, onward)   Start     Dose/Rate Route Frequency Ordered Stop   03/23/2018 1830  ampicillin-sulbactam (UNASYN) 1.5 g in sodium chloride 0.9 % 100 mL IVPB     1.5 g 200 mL/hr over 30 Minutes Intravenous Every 6 hours 03/12/2018 1730            Objective:   Vitals:   03/24/18 1600 03/24/18 1800 03/24/18 2157 03/25/18 0317  BP:   (!) 104/56 113/61  Pulse:   85 87  Resp:   18 20  Temp:   98.9 F (37.2 C) 98.9 F (37.2 C)  TempSrc:      SpO2: 97% 93% 96% 95%  Weight:      Height:        Wt Readings from Last 3 Encounters:  03/15/2018 54.4 kg  01/14/18 54.8 kg  11/22/17 55.9 kg     Intake/Output Summary (Last 24 hours) at 03/25/2018 1115 Last data filed at 03/25/2018 0740 Gross per 24 hour  Intake 200 ml  Output 1925 ml  Net -1725 ml     Physical Exam  Awake , ? Mild L facial droop, No new F.N deficits,   [place].AT,PERRAL Supple Neck,No JVD, No cervical lymphadenopathy appriciated.  Symmetrical Chest wall movement, Good air movement bilaterally, Coarse B sounds R>L RRR,No Gallops, Rubs or new Murmurs, No Parasternal Heave +ve B.Sounds,  Abd Soft, No tenderness, No organomegaly appriciated, No rebound - guarding or rigidity. No Cyanosis, Clubbing or edema, No new Rash or bruise     Data Review:    CBC Recent Labs  Lab 03/13/2018 1818 03/19/2018 2217  03/31/2018 0009 03/17/2018 0244 03/23/18 0413 03/24/18 0313 03/25/18 0019  WBC 8.3 7.2  --   --  12.6* 10.6* 10.4  --   HGB 6.2* 5.6*   < > 9.2* 8.7* 8.3* 7.8* 7.7*  HCT 21.9* 19.1*   < > 29.1* 28.9* 26.8* 25.9* 25.1*    PLT 410* 362  --   --  329 284 185  --   MCV 85.5 82.7  --   --  83.3 83.5 84.6  --   MCH 24.2* 24.2*  --   --  25.1* 25.9* 25.5*  --   MCHC 28.3* 29.3*  --   --  30.1 31.0 30.1  --   RDW 15.8* 15.9*  --   --  15.2 15.7* 16.0*  --   LYMPHSABS 0.8  --   --   --   --   --   --   --   MONOABS 1.2*  --   --   --   --   --   --   --   EOSABS 0.1  --   --   --   --   --   --   --   BASOSABS 0.1  --   --   --   --   --   --   --    < > = values in this interval not displayed.    Chemistries  Recent Labs  Lab 03/30/2018 1818 03/21/18 0953 03/21/2018 0244 03/23/18 0413 03/24/18 0313 03/25/18 0019  NA 137 138 138 136 139 133*  K 3.3* 3.3* 3.4* 3.5 3.5 3.1*  CL 105 108 106 106 107 103  CO2 23 23 24 24 22 25   GLUCOSE 113* 138* 106* 100* 104* 135*  BUN 15 9 8 11 15 21   CREATININE 0.84 0.85 0.90 0.78 0.79 0.78  CALCIUM 8.7* 8.4* 8.3* 8.2* 8.5* 8.0*  MG  --   --  2.0  --   --   --   AST 20  --   --   --   --   --   ALT 9  --   --   --   --   --   ALKPHOS 170*  --   --   --   --   --   BILITOT 0.4  --   --   --   --   --    ------------------------------------------------------------------------------------------------------------------ No results for input(s): CHOL, HDL, LDLCALC, TRIG, CHOLHDL, LDLDIRECT in the last 72 hours.  Lab Results  Component Value Date   HGBA1C 5.5 09/09/2014   ------------------------------------------------------------------------------------------------------------------ No results for input(s): TSH, T4TOTAL, T3FREE, THYROIDAB in the last 72 hours.  Invalid input(s): FREET3 ------------------------------------------------------------------------------------------------------------------ Recent Labs    03/25/18 0651  VITAMINB12 334  FOLATE 13.5  FERRITIN 175  TIBC 231*  IRON 34*  RETICCTPCT 2.0    Coagulation profile Recent Labs  Lab 03/24/2018 1818  INR 1.13    No results for input(s): DDIMER in the last 72 hours.  Cardiac Enzymes No  results for input(s): CKMB, TROPONINI, MYOGLOBIN in the last 168 hours.  Invalid input(s): CK ------------------------------------------------------------------------------------------------------------------    Component Value Date/Time   BNP 202.2 (H) 03/24/2018 2633    Micro Results  No results found for this or any previous visit (from the past 240 hour(s)).  Radiology Reports Ct Head Wo Contrast  Result Date: 03/21/2018 CLINICAL DATA:  New onset slurred speech. History of hyperlipidemia. EXAM: CT HEAD WITHOUT CONTRAST TECHNIQUE: Contiguous axial images were obtained from the base of the skull through the vertex without intravenous contrast. COMPARISON:  None. FINDINGS: Mild motion degraded examination somewhat improved on repeat attempt. BRAIN: No intraparenchymal hemorrhage, mass effect nor midline shift. Mild disproportionate mesial temporal lobe atrophy. Bilateral inferior frontal lobe encephalomalacia. No hydrocephalus. Patchy supratentorial white matter hypodensities. No acute large vascular territory infarcts. No abnormal extra-axial fluid collections. Basal cisterns are patent. VASCULAR: Moderate calcific atherosclerosis of the carotid siphons. SKULL: No skull fracture. No significant scalp soft tissue swelling. SINUSES/ORBITS: Mild paranasal sinus mucosal thickening. Mastoid air cells are well aerated.The included ocular globes and orbital contents are non-suspicious. Status post bilateral ocular lens implants. OTHER: None. IMPRESSION: 1. No acute intracranial process on this motion degraded examination. 2. Advanced mesial temporal lobe volume loss associated with neuro degenerative syndromes. 3. Bifrontal encephalomalacia compatible with TBI. 4. Moderate chronic small vessel ischemic changes. Electronically Signed   By: Elon Alas M.D.   On: 03/21/2018 23:28   Dg Chest Port 1 View  Result Date: 03/25/2018 CLINICAL DATA:  Shortness of breath. History of COPD, coronary artery  disease and previous MI, former smoker. EXAM: PORTABLE CHEST 1 VIEW COMPARISON:  Portable chest x-ray of March 23, 2018 FINDINGS: The lungs are well-expanded. The interstitial markings remain increased greatest on the right. There is a small left pleural effusion. There is pleural fluid versus pleural thickening along the left lateral pleural surface which is not new. The heart is top-normal in size. The pulmonary vascularity is not clearly engorged. The patient has undergone previous CABG. The thoracic aorta demonstrates mural calcification. IMPRESSION: Persistent diffusely increased interstitial markings bilaterally with relative sparing of the left upper lobe. Small left pleural effusion. Previous CABG. There has not been significant interval change since the earlier study. Electronically Signed   By: David  Martinique M.D.   On: 03/25/2018 07:19   Dg Chest Port 1 View  Result Date: 03/23/2018 CLINICAL DATA:  Shortness of breath EXAM: PORTABLE CHEST 1 VIEW COMPARISON:  03/31/2018 FINDINGS: Cardiac shadow is enlarged in size. Postsurgical changes are again identified. Diffuse interstitial opacity is again identified on the right stable from the prior exam. Similar changes are noted in the left base. Pleural based density is again noted in the left apex laterally stable from the prior exam. No new focal infiltrate is seen. IMPRESSION: Stable appearance of the chest when compare with the previous day. No new focal abnormality is seen. Electronically Signed   By: Inez Catalina M.D.   On: 03/23/2018 08:13   Dg Chest Port 1 View  Result Date: 04/03/2018 CLINICAL DATA:  84 year old male with shortness of breath. EXAM: PORTABLE CHEST 1 VIEW COMPARISON:  Portable chest 03/27/2018 and earlier. FINDINGS: Portable AP semi upright view at 1651 hours. Chronic lung disease, with a combination of emphysema and fibrosis demonstrated on prior CTs. Superimposed acute interstitial opacity throughout the right lung and also  likely at the left lung base. No superimposed pneumothorax. No pleural effusion is evident. Stable cardiac size and mediastinal contours. Visualized tracheal air column is within normal limits. Negative visible bowel gas pattern. Peripheral soft tissue thickening in the left upper lung may indicate progression of the hypermetabolic lesion seen on PET-CT 05/16/3233, uncertain. IMPRESSION: 1. Right greater than left  interstitial opacity superimposed on chronic lung disease could reflect asymmetric pulmonary edema, viral or atypical respiratory infection. 2. Increased peripheral soft tissue thickening in the left upper lung may indicate progression of the hypermetabolic lesion seen on PET-CT 10/17/2017. 3. 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 Electronically Signed   By: Genevie Ann M.D.   On: 04/05/2018 17:03   Dg Chest Port 1 View  Result Date: 04/03/2018 CLINICAL DATA:  Initial evaluation for shortness of breath. History of COPD. EXAM: PORTABLE CHEST 1 VIEW COMPARISON:  Prior radiograph from 11/05/2017. FINDINGS: Median sternotomy wires noted. Underlying cardiomegaly, unchanged. Coronary stent noted. Mediastinal silhouette normal. Aortic atherosclerosis. Lungs hypoinflated. Underlying COPD with irregular biapical pleuroparenchymal scarring, right greater than left. Small layering bilateral pleural effusions, left greater than right. Associated bibasilar opacities likely atelectasis, although infiltrates could be considered in the correct clinical setting. No overt pulmonary edema. No pneumothorax. No acute osseous abnormality. IMPRESSION: 1. Small bilateral pleural effusions, left greater than right. Associated bibasilar opacities likely reflect atelectasis, although infiltrates could be considered in the correct clinical setting. 2. Cardiomegaly with underlying COPD. 3. Aortic atherosclerosis. Electronically Signed   By: Jeannine Boga M.D.   On: 03/15/2018 18:48   Dg Duanne Limerick  W/small Bowel  Result Date: 03/08/2018 CLINICAL DATA:  Generalized abdominal pain. History of COPD and biopsy-proven left lower lobe lung cancer. EXAM: UPPER GI SERIES WITH SMALL BOWEL FOLLOW-THROUGH FLUOROSCOPY TIME:  Fluoroscopy Time:  3 minutes 12 seconds Radiation Exposure Index (if provided by the fluoroscopic device): 217 mGy Number of Acquired Spot Images: 14 TECHNIQUE: Combined double contrast and single contrast upper GI series using effervescent crystals, thick barium, and thin barium. Subsequently, serial images of the small bowel were obtained including spot views of the terminal ileum. COMPARISON:  01/25/2018 CT abdomen.  10/17/2017 PET-CT. FINDINGS: Scout radiograph demonstrates no dilated small bowel loops. Moderate colonic stool. No evidence of pneumatosis or pneumoperitoneum. No radiopaque nephrolithiasis. Evaluation is significantly limited by patient's mobility restrictions. Specifically, evaluation was limited to supine and anterior oblique imaging (patient unable to lie prone or stand for significant period of time). Esophageal motility is grossly normal. No evidence of hiatal hernia. Mild gastroesophageal reflux was elicited to the level of lower thoracic esophagus with water siphon test. Normal esophageal distensibility, with no evidence of esophageal mass, stricture or ulcer. No evidence of gastric fold thickening, ulcers or filling defects. Normal gastric emptying. Normal duodenal sweep with no evidence of duodenal fold thickening, strictures, ulcers or filling defects. Normal duodenal jejunal junction to the left of the spine. There is normal small bowel transit time of 50 minutes. Normal caliber of the small bowel loops with no small bowel fold thickening, focal caliber changes or fixed positioning. No small bowel filling defects or fistulous. Terminal ileum appears normal. IMPRESSION: 1. Evaluation limited by patient mobility restrictions, see comments. 2. Mild gastroesophageal reflux  elicited.  No hiatal hernia. 3. Otherwise unremarkable limited upper GI and small-bowel follow-through. Normal esophageal, gastric and small bowel motility. No evidence of peptic ulcer disease. No evidence of small-bowel obstruction. Electronically Signed   By: Ilona Sorrel M.D.   On: 03/08/2018 10:57   Dg Swallowing Func-speech Pathology  Result Date: 03/24/2018 Objective Swallowing Evaluation: Type of Study: MBS-Modified Barium Swallow Study  Patient Details Name: Timothy EAKLE Sr. MRN: [id number] Date of Birth: [birth year] Today's Date: 03/24/2018 Time: SLP Start Time (ACUTE ONLY): 1030 -SLP Stop Time (ACUTE ONLY): 1055 SLP Time Calculation (min) (ACUTE ONLY): 25 min Past Medical  History: Past Medical History: Diagnosis Date . Anemia  . Anginal pain ([place])  . Complication of anesthesia 1987  "didn't wake up for 5 days" . COPD (chronic obstructive pulmonary disease) ([place])  . Coronary artery disease  . Exertional dyspnea 01/04/2012 . GI bleed due to NSAIDs 02/2012 . High cholesterol  . History of blood transfusion 1987; 11/25/2014; 04/04/2018  "emergent CABG; anemia; anemia" . Hyperlipidemia  . Hypothyroidism  . Myocardial infarction ([place]) 1987 . Numbness and tingling in hands 01/04/2012  "right one goes to sleep on steering wheel when I'm driving; been that way long time; @ least 1 yr" . Symptomatic anemia 03/18/2018 . Thyroid disease  Past Surgical History: Past Surgical History: Procedure Laterality Date . CARDIAC CATHETERIZATION   . CARDIAC CATHETERIZATION N/A 09/10/2014  Procedure: Left Heart Cath and Cors/Grafts Angiography;  Surgeon: Peter M Martinique, MD;  Location: [facility];  Service: Cardiovascular;  Laterality: N/A; . CATARACT EXTRACTION W/ INTRAOCULAR LENS  IMPLANT, BILATERAL Bilateral 2012 . COLONOSCOPY N/A 12/04/2014  Procedure: COLONOSCOPY;  Surgeon: Teena Irani, MD;  Location: [facility] ENDOSCOPY;  Service: Endoscopy;  Laterality: N/A; . COLONOSCOPY W/ POLYPECTOMY  2011  "had to take it out in 3  pieces; all benign" . CORONARY ANGIOPLASTY WITH STENT PLACEMENT  ? 1990's  "3" . CORONARY ANGIOPLASTY WITH STENT PLACEMENT  01/04/2012  "2; total of 5" . CORONARY ARTERY BYPASS GRAFT  1987  CABG X1 . ESOPHAGOGASTRODUODENOSCOPY  02/07/2012  Procedure: ESOPHAGOGASTRODUODENOSCOPY (EGD);  Surgeon: Wonda Horner, MD;  Location: [facility] ENDOSCOPY;  Service: Endoscopy;  Laterality: N/A; . ESOPHAGOGASTRODUODENOSCOPY (EGD) WITH PROPOFOL Left 11/26/2014  Procedure: ESOPHAGOGASTRODUODENOSCOPY (EGD) WITH PROPOFOL;  Surgeon: Arta Silence, MD;  Location: [facility] ENDOSCOPY;  Service: Endoscopy;  Laterality: Left; . PERCUTANEOUS CORONARY STENT INTERVENTION (PCI-S) N/A 01/04/2012  Procedure: PERCUTANEOUS CORONARY STENT INTERVENTION (PCI-S);  Surgeon: Sinclair Grooms, MD;  Location: [facility] CATH LAB;  Service: Cardiovascular;  Laterality: N/A; . WRIST FRACTURE SURGERY Left ~ 2009 . WRIST HARDWARE REMOVAL Left ~ 2010  "took steel bar out" HPI: Pt is an 84 yo male with PMH of chronic recurrent anemia secondary to GI bleed due to NSAID use, recurrent abdominal pain, HLD, CAD, hypothyroidism, lung cancer with previous radiation therapy ending in 2019, and severe COPD, who was admitted for symptomatic anemia and SOB. EGD 11/15 showed esophageal plaques concerning for candidiasis, widely patent Schatzkis ring, food residue in the stomach, and a small HH.  Subjective: pt alert, speech is significantly slurred Assessment / Plan / Recommendation CHL IP CLINICAL IMPRESSIONS 03/24/2018 Clinical Impression Patient presents with moderate oropharyngeal dysphagia with a reduced cricopharyngeal opening. He required verbal cueing to swallow. He had adequate mastication with poor bolus formation, reduced anterior to posterior transfer and reduced lingual elevation evidenced by mild oral residue.  With all consistencies the swallow was triggered at the valleculae with no epiglottic inversion (epiglottis cannot clear cervical vertebrae) resulting in vallecula pooling  (improved by liquid wash). Patient is attempting to compensate absent epiglottic inversion with a hard swallow/pharyngeal squeezing). Pyriform sinus pooling was noted due to reduced cricopharyngus opeing and early closure. There was only one incidence of layngeal penetration (10% of bolus) that went below the cords and then ejected fully. Recommend Dys 2 diet with thin liquids. Compensatory strategies to include 1. reduce environmental distractions, 2. full assistance to verbally cue patient to swallow, 3. present small sips and bites (alternating). Notified RN of recommendations. Completed education at bedside with large family, specifically pt's daughter, 2 sons and wife.  Patient remains a moderate risk for aspiration. Speech therapy to follow up for diet tolerance, review aspiration risk and compensatory training.  SLP Visit Diagnosis Dysphagia, oropharyngeal phase (R13.12);Dysphagia, pharyngoesophageal phase (R13.14) Attention and concentration deficit following -- Frontal lobe and executive function deficit following -- Impact on safety and function Moderate aspiration risk   CHL IP TREATMENT RECOMMENDATION 03/24/2018 Treatment Recommendations Therapy as outlined in treatment plan below   Prognosis 03/24/2018 Prognosis for Safe Diet Advancement Fair Barriers to Reach Goals Severity of deficits Barriers/Prognosis Comment -- CHL IP DIET RECOMMENDATION 03/24/2018 SLP Diet Recommendations Dysphagia 2 (Fine chop) solids;Thin liquid Liquid Administration via Cup;No straw Medication Administration Whole meds with puree Compensations Slow rate;Small sips/bites;Follow solids with liquid Postural Changes Remain semi-upright after after feeds/meals (Comment);Seated upright at 90 degrees   CHL IP OTHER RECOMMENDATIONS 03/24/2018 Recommended Consults -- Oral Care Recommendations Oral care BID Other Recommendations --   CHL IP FOLLOW UP RECOMMENDATIONS 03/24/2018 Follow up Recommendations Home health SLP   CHL IP FREQUENCY  AND DURATION 03/24/2018 Speech Therapy Frequency (ACUTE ONLY) min 2x/week Treatment Duration 2 weeks      CHL IP ORAL PHASE 03/24/2018 Oral Phase Impaired Oral - Pudding Teaspoon Weak lingual manipulation;Incomplete tongue to palate contact;Reduced posterior propulsion Oral - Pudding Cup -- Oral - Honey Teaspoon -- Oral - Honey Cup -- Oral - Nectar Teaspoon -- Oral - Nectar Cup -- Oral - Nectar Straw -- Oral - Thin Teaspoon -- Oral - Thin Cup Weak lingual manipulation;Incomplete tongue to palate contact Oral - Thin Straw -- Oral - Puree -- Oral - Mech Soft Weak lingual manipulation;Incomplete tongue to palate contact;Reduced posterior propulsion Oral - Regular -- Oral - Multi-Consistency -- Oral - Pill -- Oral Phase - Comment --  CHL IP PHARYNGEAL PHASE 03/24/2018 Pharyngeal Phase Impaired Pharyngeal- Pudding Teaspoon Reduced epiglottic inversion;Delayed swallow initiation-vallecula;Reduced laryngeal elevation;Reduced airway/laryngeal closure;Reduced tongue base retraction;Pharyngeal residue - valleculae;Pharyngeal residue - pyriform Pharyngeal -- Pharyngeal- Pudding Cup -- Pharyngeal -- Pharyngeal- Honey Teaspoon -- Pharyngeal -- Pharyngeal- Honey Cup -- Pharyngeal -- Pharyngeal- Nectar Teaspoon -- Pharyngeal -- Pharyngeal- Nectar Cup -- Pharyngeal -- Pharyngeal- Nectar Straw -- Pharyngeal -- Pharyngeal- Thin Teaspoon -- Pharyngeal -- Pharyngeal- Thin Cup Delayed swallow initiation-vallecula;Reduced epiglottic inversion;Reduced anterior laryngeal mobility;Reduced laryngeal elevation;Reduced airway/laryngeal closure;Reduced tongue base retraction;Trace aspiration;Pharyngeal residue - valleculae;Pharyngeal residue - pyriform Pharyngeal -- Pharyngeal- Thin Straw -- Pharyngeal -- Pharyngeal- Puree -- Pharyngeal -- Pharyngeal- Mechanical Soft Delayed swallow initiation-vallecula;Reduced epiglottic inversion;Reduced anterior laryngeal mobility;Reduced laryngeal elevation;Reduced airway/laryngeal closure;Reduced tongue  base retraction;Pharyngeal residue - valleculae;Pharyngeal residue - pyriform Pharyngeal -- Pharyngeal- Regular -- Pharyngeal -- Pharyngeal- Multi-consistency -- Pharyngeal -- Pharyngeal- Pill -- Pharyngeal -- Pharyngeal Comment --  CHL IP CERVICAL ESOPHAGEAL PHASE 03/24/2018 Cervical Esophageal Phase Impaired Pudding Teaspoon Reduced cricopharyngeal relaxation Pudding Cup -- Honey Teaspoon -- Honey Cup -- Nectar Teaspoon -- Nectar Cup -- Nectar Straw -- Thin Teaspoon -- Thin Cup Reduced cricopharyngeal relaxation Thin Straw -- Puree -- Mechanical Soft Reduced cricopharyngeal relaxation Regular -- Multi-consistency -- Pill -- Cervical Esophageal Comment EGD showed esophagitis and patient has know Schatcki's ring, reduced cricopharygeal opening Moffat, MA, CCC-SLP 03/24/2018 12:16 PM               Time Spent in minutes  30   Lala Lund M.D on 03/25/2018 at 11:15 AM  To page go to [email or web address] - password [email or web address]

## 2018-03-25 NOTE — Significant Event (Addendum)
Rapid Response Event Note  Overview:   Called by bedside RN about pt with decreased LOC during bath. On another call at that time, instructed RN to get a recent set of vitals and CBG and call back with results. Verified pt was breathing and had a pulse. Called back with results: CBG 178, HR 103, BP 122/45, spO2 99%. RN stating pt is more awake at this time. Informed RN to continue to monitor pt status and I will be by to assess pt as soon as possible.   Initial Focused Assessment: On arrival, pt resting in bed with no signs of distress noted. Arousable to voice, nods appropriately, family says this is more towards his baseline. Lungs clear in upper lobes RLL diminished, LLL with some crackles noted. Pt denies pain, he appears pale (family confirmed). HR 89 BP 87/40 (55)  spO2 94% on 7L HFNC  Interventions: Ordered CBC, BMP, cardiac monitoring, spO2 monitoring, paged Bodenheimer and made aware. Protonix gtt ordered. Bolus 1L NS, 20g PIV started,   Plan of Care (if not transferred): Awaiting lab results. Per Bodenheimer, will transfuse hgb <7.5. BP improving with Bolus. Bedside RN instructed to call with lab results.   Event Summary:  called at  2200   event ended at  0000    0140: Hgb 5.2. Order to transfuse 2 u PRBCs, HR83, BP 90/49, 97% on 9L HFNC  Called back at 0447: Pt hypoxic with spO2 75-85%. RT at bedside to place NRB. sats improved to 100%. Pt also had another bloody BM per bedside RN. 2nd unit of blood about to start. Instructed to call Bodenheimer and inform of changes, will come assess as soon as possible.  0540: Abg 7.4/35/250/23.   3403: [id number] paged regarding continued low BP. Currently on 2nd unit of blood. Ordered to finish blood and monitor. Will re-consult GI about bloody stool. Attempt to take off NRB. Bedside RN made aware.     Sherilyn Dacosta

## 2018-03-25 NOTE — Progress Notes (Signed)
Pt with increased difficulty swallowing food. Attempts at feeding today have resulted in food needing to be wiped out of patient's mouth or suctioned out. MD aware. New orders received. See MAR. Roselyn Reef CovingtonRN [phone]

## 2018-03-25 NOTE — Consult Note (Addendum)
Neurology Consultation  Reason for Consult: Weakness and decline Referring Physician: Candiss Norse  History is obtained from: Family  HPI: [name] Sr. is a 84 y.o. male with thyroid disease, symptomatic anemia, numbness and tingling in hands, myocardial infarct, hypothyroidism, hyperlipidemia, high cholesterol, GI bleeds due to NSAIDs, anemia, and CAD.  Patient was brought to the hospital secondary to having GI pains and getting significantly weaker over time.  In talking to the family patient started to have problems with his speech about 6 months ago; however, over the past week has had difficulty with his secretions.  He has had a significant weight loss over the past 5 years; however, over the last month he has had a significant significant decrease in weight.  The son who is also a physical therapist says he has developed worsening shuffling for gait for a "solid year". Also with bradykinesia over the last several months and stiffness which hs gotten worse over the last couple months.  Festinating quality to his gait has also been observed. At home he does not have hallucinations but while he has been here he apparently has been having hallucinations of flies and things on the walls.  Per family usually he is very funny and chatty; in fact last Wednesday they had brought him out for lunch at which he had a hamburger and then went to his primary doctor Dr.Kim Brigitte Pulse. She stated that he did not look good and sent him to be evaluated at the hospital.  While in the hospital he was found to be extremely anemic.  He has had multiple tests to evaluate for the underlying etiology of his blood loss; however, he has not received a colonoscopy secondary to the fact that he is not a surgical candidate.  Currently while he is in the hospital he has multiple medical issues such as symptomatic acute on chronic iron deficiency anemia, COPD, hypothyroidism, hypokalemia, aspiration pneumonia.  Patient has had a CT of  the head which shows stable age-related cerebral atrophy along with ventriculomegaly and periventricular white matter disease. No acute findings are seen. He cannot have an MRI as he has a non-MRI compatible clip which was placed surgically in his abdomen previously.   ROS: ROS was performed and is negative except as noted in the HPI.  Past Medical History:  Diagnosis Date  . Anemia   . Anginal pain ([place])   . Complication of anesthesia 1987   "didn't wake up for 5 days"  . COPD (chronic obstructive pulmonary disease) ([place])   . Coronary artery disease   . Exertional dyspnea 01/04/2012  . GI bleed due to NSAIDs 02/2012  . High cholesterol   . History of blood transfusion 1987; 11/25/2014; 03/18/2018   "emergent CABG; anemia; anemia"  . Hyperlipidemia   . Hypothyroidism   . Myocardial infarction ([place]) 1987  . Numbness and tingling in hands 01/04/2012   "right one goes to sleep on steering wheel when I'm driving; been that way long time; @ least 1 yr"  . Symptomatic anemia 03/24/2018  . Thyroid disease    Family History  Problem Relation Age of Onset  . Emphysema Father        ? if he smoked or not    Social History:   reports that he quit smoking about 29 years ago. His smoking use included cigarettes. He has a 40.00 pack-year smoking history. He has never used smokeless tobacco. He reports that he does not drink alcohol or use drugs.  Medications  Current Facility-Administered Medications:  .  acetaminophen (TYLENOL) tablet 650 mg, 650 mg, Oral, Q4H PRN, Lala Lund K, MD .  ampicillin-sulbactam (UNASYN) 1.5 g in sodium chloride 0.9 % 100 mL IVPB, 1.5 g, Intravenous, Q6H, Thurnell Lose, MD, Last Rate: 200 mL/hr at 03/25/18 1641, 1.5 g at 03/25/18 1641 .  dextrose 5 % solution, , Intravenous, Continuous, Thurnell Lose, MD, Last Rate: 50 mL/hr at 03/25/18 1629 .  famotidine (PEPCID) tablet 20 mg, 20 mg, Oral, BID, Thurnell Lose, MD, 20 mg at 03/25/18 1212 .  feeding  supplement (ENSURE ENLIVE) (ENSURE ENLIVE) liquid 237 mL, 237 mL, Oral, BID BM, Thurnell Lose, MD, 237 mL at 03/25/18 1231 .  ferrous sulfate tablet 325 mg, 325 mg, Oral, BID WC, Thurnell Lose, MD, 325 mg at 03/25/18 1212 .  guaiFENesin-dextromethorphan (ROBITUSSIN DM) 100-10 MG/5ML syrup 5 mL, 5 mL, Oral, Q4H PRN, Thurnell Lose, MD, 5 mL at 03/21/18 2140 .  ipratropium-albuterol (DUONEB) 0.5-2.5 (3) MG/3ML nebulizer solution 3 mL, 3 mL, Nebulization, Q6H PRN, Thurnell Lose, MD, 3 mL at 03/24/18 1452 .  levothyroxine (SYNTHROID, LEVOTHROID) tablet 50 mcg, 50 mcg, Oral, Q0600, Thurnell Lose, MD, 50 mcg at 03/25/18 0716 .  multivitamin with minerals tablet 1 tablet, 1 tablet, Oral, Daily, Thurnell Lose, MD, 1 tablet at 03/25/18 1212 .  nitroGLYCERIN (NITROSTAT) SL tablet 0.4 mg, 0.4 mg, Sublingual, Q5 min PRN, Thurnell Lose, MD .  nystatin (MYCOSTATIN) 100000 UNIT/ML suspension 500,000 Units, 5 mL, Oral, QID, Thurnell Lose, MD, 500,000 Units at 03/25/18 1212 .  pantoprazole (PROTONIX) EC tablet 40 mg, 40 mg, Oral, Daily, Thurnell Lose, MD, 40 mg at 03/25/18 1212 .  rosuvastatin (CRESTOR) tablet 10 mg, 10 mg, Oral, Daily, Thurnell Lose, MD, 10 mg at 03/25/18 1212 .  sucralfate (CARAFATE) tablet 1 g, 1 g, Oral, BID, Thurnell Lose, MD, 1 g at 03/25/18 1212   Exam: Current vital signs: BP (!) 99/51 (BP Location: Left Arm)   Pulse 85   Temp 98.6 F (37 C) (Oral)   Resp 18   Ht 5\' 8"  (1.727 m)   Wt 54.4 kg   SpO2 91%   BMI 18.25 kg/m  Vital signs in last 24 hours: Temp:  [98.6 F (37 C)-98.9 F (37.2 C)] 98.6 F (37 C) (11/18 1347) Pulse Rate:  [85-87] 85 (11/18 1347) Resp:  [18-20] 18 (11/18 1347) BP: (99-113)/(51-61) 99/51 (11/18 1347) SpO2:  [91 %-96 %] 91 % (11/18 1347)  Physical Exam  Constitutional: Cachectic Psych: Bradykinesia at and appears to be slightly depressed Eyes: No scleral injection HENT: No OP obstrucion Head:  Normocephalic.  Cardiovascular: Normal rate and regular rhythm.  Respiratory: Effort normal, non-labored breathing GI: Soft.  No distension. There is no tenderness.  Skin: WDI  Neuro: Mental Status: Patient has severe dysarthria with buccal, lingual and pharyngeal components. He is able to follow simple commands however due to his bradykinesia he is slow to respond. At times it appears that he does not understand was being asked of him despite frequent repetition and loud questions.  He is hard of hearing but has his hearing aids in.  He was able to tell us the year but not the month or city. Cranial Nerves: II: Visual Fields are full. Pupils are equal, round, and reactive to light.   III,IV, VI: EOMI without ptosis, when looking left to right he does have saccadic visual pursuits that are worse than when tracking  to the right. Saccades to the right are also brisker than towards the left.  V: Facial sensation is symmetric to temperature VII: Facial movement is symmetric.  He also has widening eyes at rest and hypomimia of facial expression. VIII: HOH X: Uvula elevates symmetrically. Palate elevation decreased but symmetric XI: Shoulder shrug is symmetric with 4/5 strength. XII: tongue is midline without atrophy or fasciculations.  Motor: Bilateral upper extremities are 4/5, bilateral lower extremities are 4-/5.  No asymmetry noted. Patient has mild mixed rigidity and spasticity of bilateral lower extremities.  Mildly increased tone in bilateral upper extremities. Sensory: Sensation is symmetric to light touch and temperature in the arms and legs. Deep Tendon Reflexes: 0 at the ankles, 2+ knee jerks, 3+ at biceps bilaterally Plantars: Toes are downgoing bilaterally.  Cerebellar: Slow FNF on the right without ataxia. Unable to complete movement on left. Prominent bradykinesia noted. No resting or action tremor appreciated.    Labs I have reviewed labs in epic and the results pertinent to  this consultation are:   CBC    Component Value Date/Time   WBC 10.4 03/24/2018 0313   RBC 3.00 (L) 03/25/2018 0651   RBC 3.06 (L) 03/24/2018 0313   HGB 7.7 (L) 03/25/2018 0019   HCT 25.1 (L) 03/25/2018 0019   PLT 185 03/24/2018 0313   MCV 84.6 03/24/2018 0313   MCH 25.5 (L) 03/24/2018 0313   MCHC 30.1 03/24/2018 0313   RDW 16.0 (H) 03/24/2018 0313   LYMPHSABS 0.8 03/17/2018 1818   MONOABS 1.2 (H) 03/28/2018 1818   EOSABS 0.1 03/16/2018 1818   BASOSABS 0.1 03/10/2018 1818    CMP     Component Value Date/Time   NA 133 (L) 03/25/2018 0019   K 3.1 (L) 03/25/2018 0019   CL 103 03/25/2018 0019   CO2 25 03/25/2018 0019   GLUCOSE 135 (H) 03/25/2018 0019   BUN 21 03/25/2018 0019   CREATININE 0.78 03/25/2018 0019   CALCIUM 8.0 (L) 03/25/2018 0019   PROT 7.3 03/19/2018 1818   ALBUMIN 3.1 (L) 03/30/2018 1818   AST 20 03/16/2018 1818   ALT 9 03/28/2018 1818   ALKPHOS 170 (H) 04/02/2018 1818   BILITOT 0.4 03/13/2018 1818   GFRNONAA >60 03/25/2018 0019   GFRAA >60 03/25/2018 0019    Lipid Panel     Component Value Date/Time   CHOL 106 09/10/2014 0406   TRIG 39 09/10/2014 0406   HDL 43 09/10/2014 0406   CHOLHDL 2.5 09/10/2014 0406   VLDL 8 09/10/2014 0406   LDLCALC 55 09/10/2014 0406     Imaging I have reviewed the images obtained:  CT-scan of the brain-as noted above in HPI   Etta Quill PA-C [facility] [phone] 03/25/2018, 5:09 PM     Assessment: This is an 84 year old male who likely has some 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 Likely onset approximately 1-2 years ago. Symptoms likely being exacerbated by intercurrent illness with hypoxemia, anemia and aspiration pneumonia.   1. The presentation is not typical for myasthenia gravis. No lateralizing findings to suggest stroke.  2. Also on the DDx is a motor  neuron disease such as progressive bulbar palsy and pseudobulbar palsy, both of which can present with the prominent bulbar weakness, dysarthria and difficulty with swallowing seen with this patient.  3. COPD. 4. Symptomatic anemia from possible GI bleed 5. Mild hypokalemia and hyponatremia 6. Most prominent neurological findings on  exam involve speech and oropharyngeal function, most consistent with bulbar weakness as a manifestation of  7. Hypothyroidism  Recommendations: -Treat underlying anemia. Symptoms likely to improve with improvement of his HCT, but likely to be completely alleviated due to the relatively high likelihood of an underlying neurodegenerative condition.  -Treat underlying aspiration pneumonia. May need a PEG tube at some point if family consents and if his bulbar weakness does not improve with resolution of anemia and aspiration PNA.  - PT/OT - Speech therapy for continued swallowing recommendations.  -- Will need outpatient follow up with [facility] or [facility] for further evaluation of probable underlying neurodegenerative condition, with most likely etiologies felt to be a Parkinson's plus syndrome or motor neuron disease. Outpatient evaluation should include an EMG/NCS of both limb and select bulbar muscles -- IV hydration -- Nutrition consult  Electronically signed: Dr. Kerney Elbe

## 2018-03-26 ENCOUNTER — Inpatient Hospital Stay (HOSPITAL_COMMUNITY): Payer: Medicare Other

## 2018-03-26 ENCOUNTER — Encounter (HOSPITAL_COMMUNITY): Payer: Self-pay | Admitting: Radiology

## 2018-03-26 LAB — CBC
HCT: 17.1 % — ABNORMAL LOW (ref 39.0–52.0)
HEMATOCRIT: 20.3 % — AB (ref 39.0–52.0)
HEMOGLOBIN: 6 g/dL — AB (ref 13.0–17.0)
Hemoglobin: 5.2 g/dL — CL (ref 13.0–17.0)
MCH: 25.5 pg — ABNORMAL LOW (ref 26.0–34.0)
MCH: 24.9 pg — AB (ref 26.0–34.0)
MCHC: 29.6 g/dL — ABNORMAL LOW (ref 30.0–36.0)
MCHC: 30.4 g/dL (ref 30.0–36.0)
MCV: 83.8 fL (ref 80.0–100.0)
MCV: 84.2 fL (ref 80.0–100.0)
PLATELETS: 14 10*3/uL — AB (ref 150–400)
Platelets: 13 10*3/uL — CL (ref 150–400)
RBC: 2.04 MIL/uL — ABNORMAL LOW (ref 4.22–5.81)
RBC: 2.41 MIL/uL — AB (ref 4.22–5.81)
RDW: 17.2 % — ABNORMAL HIGH (ref 11.5–15.5)
RDW: 17.6 % — ABNORMAL HIGH (ref 11.5–15.5)
WBC: 8.4 10*3/uL (ref 4.0–10.5)
WBC: 8.5 10*3/uL (ref 4.0–10.5)
nRBC: 0.5 % — ABNORMAL HIGH (ref 0.0–0.2)
nRBC: 0.5 % — ABNORMAL HIGH (ref 0.0–0.2)

## 2018-03-26 LAB — BLOOD GAS, ARTERIAL
Acid-base deficit: 0.6 mmol/L (ref 0.0–2.0)
Bicarbonate: 23.1 mmol/L (ref 20.0–28.0)
Drawn by: 511911
FIO2: 100
O2 Saturation: 99.9 %
Patient temperature: 98.6
pCO2 arterial: 35.3 mmHg (ref 32.0–48.0)
pH, Arterial: 7.432 (ref 7.350–7.450)
pO2, Arterial: 250 mmHg — ABNORMAL HIGH (ref 83.0–108.0)

## 2018-03-26 LAB — BASIC METABOLIC PANEL
Anion gap: 8 (ref 5–15)
Anion gap: 8 (ref 5–15)
BUN: 41 mg/dL — ABNORMAL HIGH (ref 8–23)
BUN: 44 mg/dL — AB (ref 8–23)
CHLORIDE: 112 mmol/L — AB (ref 98–111)
CO2: 21 mmol/L — ABNORMAL LOW (ref 22–32)
CO2: 21 mmol/L — ABNORMAL LOW (ref 22–32)
Calcium: 7.6 mg/dL — ABNORMAL LOW (ref 8.9–10.3)
Calcium: 7.7 mg/dL — ABNORMAL LOW (ref 8.9–10.3)
Chloride: 112 mmol/L — ABNORMAL HIGH (ref 98–111)
Creatinine, Ser: 1.01 mg/dL (ref 0.61–1.24)
Creatinine, Ser: 0.99 mg/dL (ref 0.61–1.24)
GFR calc Af Amer: >60 mL/min (ref >60)
GFR calc Af Amer: >60 mL/min (ref >60)
GFR calc non Af Amer: >60 mL/min (ref >60)
GLUCOSE: 163 mg/dL — AB (ref 70–99)
Glucose, Bld: 180 mg/dL — ABNORMAL HIGH (ref 70–99)
POTASSIUM: 3.6 mmol/L (ref 3.5–5.1)
Potassium: 3.6 mmol/L (ref 3.5–5.1)
Sodium: 141 mmol/L (ref 135–145)
Sodium: 141 mmol/L (ref 135–145)

## 2018-03-26 LAB — DIC (DISSEMINATED INTRAVASCULAR COAGULATION)PANEL
D-Dimer, Quant: >20 ug/mL-FEU — ABNORMAL HIGH (ref 0.00–0.50)
INR: 2.2
Prothrombin Time: 24.1 seconds — ABNORMAL HIGH (ref 11.4–15.2)
aPTT: 49 seconds — ABNORMAL HIGH (ref 24–36)

## 2018-03-26 LAB — DIC (DISSEMINATED INTRAVASCULAR COAGULATION) PANEL
FIBRINOGEN: 90 mg/dL — AB (ref 210–475)
PLATELETS: 13 10*3/uL — AB (ref 150–400)
SMEAR REVIEW: NONE SEEN

## 2018-03-26 LAB — PREPARE RBC (CROSSMATCH)

## 2018-03-26 LAB — MAGNESIUM: Magnesium: 2.1 mg/dL (ref 1.7–2.4)

## 2018-03-26 LAB — HEMOGLOBIN AND HEMATOCRIT, BLOOD
HEMATOCRIT: 22.9 % — AB (ref 39.0–52.0)
Hemoglobin: 7.3 g/dL — ABNORMAL LOW (ref 13.0–17.0)

## 2018-03-26 MED ORDER — SODIUM CHLORIDE 0.9% IV SOLUTION
Freq: Once | INTRAVENOUS | Status: AC
  Administered 2018-03-26: 15:00:00 via INTRAVENOUS

## 2018-03-26 MED ORDER — SODIUM CHLORIDE 0.9% IV SOLUTION: Freq: Once | INTRAVENOUS | Status: DC

## 2018-03-26 MED ORDER — TECHNETIUM TC 99M-LABELED RED BLOOD CELLS IV KIT
25.0000 | PACK | Freq: Once | INTRAVENOUS | Status: AC | PRN
  Administered 2018-03-26: 25 via INTRAVENOUS

## 2018-03-26 MED ORDER — IOPAMIDOL (ISOVUE-300) INJECTION 61%
INTRAVENOUS | Status: AC
  Filled 2018-03-26: qty 200

## 2018-03-26 MED ORDER — FUROSEMIDE 10 MG/ML IJ SOLN: 40.0000 mg | Freq: Once | INTRAMUSCULAR | Status: DC

## 2018-03-26 MED ORDER — FUROSEMIDE 10 MG/ML IJ SOLN: 10.0000 mg | INTRAMUSCULAR | Status: DC

## 2018-03-26 MED ORDER — DIPHENHYDRAMINE HCL 50 MG/ML IJ SOLN: 25.0000 mg | Freq: Four times a day (QID) | INTRAMUSCULAR | Status: DC | PRN

## 2018-03-26 MED ORDER — SODIUM CHLORIDE 0.9% IV SOLUTION
Freq: Once | INTRAVENOUS | Status: AC
  Administered 2018-03-26: 11:00:00 via INTRAVENOUS

## 2018-03-26 MED ORDER — LIDOCAINE HCL 1 % IJ SOLN
INTRAMUSCULAR | Status: AC
  Filled 2018-03-26: qty 20

## 2018-03-27 LAB — BPAM FFP
Blood Product Expiration Date: 201911242359
Blood Product Expiration Date: 201911242359
Blood Product Expiration Date: 201911242359
Blood Product Expiration Date: 201911242359
Blood Product Expiration Date: 201911242359
ISSUE DATE / TIME: 201911191607
Unit Type and Rh: 6200
Unit Type and Rh: 6200
Unit Type and Rh: 6200
Unit Type and Rh: 6200
Unit Type and Rh: 6200

## 2018-03-27 LAB — PREPARE FRESH FROZEN PLASMA
UNIT DIVISION: 0
Unit division: 0

## 2018-03-27 LAB — TYPE AND SCREEN
ABO/RH(D): A POS
Antibody Screen: NEGATIVE
Unit division: 0
Unit division: 0
Unit division: 0
Unit division: 0
Unit division: 0
Unit division: 0

## 2018-03-27 LAB — PREPARE PLATELET PHERESIS
UNIT DIVISION: 0
Unit division: 0

## 2018-03-27 LAB — BPAM RBC
Blood Product Expiration Date: 201912072359
Blood Product Expiration Date: 201912092359
Blood Product Expiration Date: 201912132359
Blood Product Expiration Date: 201912132359
Blood Product Expiration Date: 201912132359
Blood Product Expiration Date: 201912132359
ISSUE DATE / TIME: 201911190126
ISSUE DATE / TIME: 201911190439
ISSUE DATE / TIME: 201911191052
ISSUE DATE / TIME: 201911191446
Unit Type and Rh: 6200
Unit Type and Rh: 6200
Unit Type and Rh: 6200
Unit Type and Rh: 6200
Unit Type and Rh: 6200
Unit Type and Rh: 6200

## 2018-03-27 LAB — BPAM PLATELET PHERESIS
BLOOD PRODUCT EXPIRATION DATE: 201911192359
BLOOD PRODUCT EXPIRATION DATE: 201911212359
ISSUE DATE / TIME: 201911190818
Unit Type and Rh: 6200
Unit Type and Rh: 9500

## 2018-04-07 NOTE — Progress Notes (Signed)
Per Lilia Pro in respiratory patient's ABG is normal except his pO2 was 250. She told me to attempt to remove the nonrebreather mask and put him back on 8 liters of O2. This RN attempted to remove the patient off of the nonrebreather and place him on 8 liters nasal cannual. Patient immediately desat to 70's and 80's. Patient began to have trouble breathing. Rapid RN Nikki at bedside. Placed nonrebreather back on patient and his O2 sats returned to 90's. Bodenheimer,NP aware of the situation.

## 2018-04-07 NOTE — Progress Notes (Signed)
About an hour after receiving the first unit of blood, patient O2 sats dropped into the 70's and would not increase. Lilia Pro from respiratory came to bedside and tried to place patient on a venturi mask. Patient O2 sats still did not increase. Patient placed on a nonrebreather and patient now has an O2 sat of 100%. Patient also had a medium size bloody bowel movement. Nikki,Rapid RN notified and she said she would come by after she finished at another call. Bodenheimer,NP notified of change in patient's O2 sats and he placed an order for a blood gas and a chest xray. Second unit of blood infusing at this time.

## 2018-04-07 NOTE — Progress Notes (Addendum)
PROGRESS NOTE                                                                                                                                                                                                             Patient Demographics:    [name], is a 84 y.o. male, DOB - [birth year], JOI:[id number]  Admit date - 03/14/2018   Admitting Physician Elwyn Reach, MD  Outpatient Primary MD for the patient is Mayra Neer, MD  LOS - 6  Chief Complaint  Patient presents with  . Abnormal Lab  . Abdominal Pain       Brief Narrative  Timothy WAMSER Sr. is a 84 y.o. male with medical history significant of chronic recurrent anemia secondary to previous GI bleed, recurrent abdominal pain, hyperlipidemia, coronary artery disease, hypothyroidism who apparently was at his doctor's office today with complaint of exertional dyspnea, occasional weakness and dizziness, ankle swelling on and off,.  His lab work was done showing a hemoglobin of 6.  Patient denied any recent melena.  Denied any bright red blood per rectum. He did have epigastric pain and has H/O AVM bleeds in the past, likely now in DIC.    Subjective:   Patient says he feels weak and mildly short of breath but denies any headache chest or abdominal pain.  No focal weakness.   Assessment  & Plan :      1.Symptomatic acute on chronic iron deficiency anemia due to blood loss related anemia with epigastric pain and history of AVM bleeds in the past.  Likely occurred subacute upper GI bleed -seen by GI and underwent EGD on 03/18/2018 showing minimal gastritis and possible mild esophagitis for which she was placed on nystatin swish and swallow and PPI along with Zantac.  He had received 2 units of packed RBC transfusion earlier this admission.  He was doing fairly well unfortunately early morning April 03, 2018 around 3 AM he started having bright red blood per rectum which was pretty heavy, this caused severe acute blood  loss related anemia along with consumptive thrombocytopenia and possibly mild DIC.  GI was consulted, tagged RBC scan has been ordered, he is in very frail and poor underlying shape and not a candidate for colonoscopy or aggressive measures at this time except for transfusion.  Family updated bedside multiple times and they agree with this, he will be treated medically, if declines comfort measures.  This point I have initiated 4 units of packed RBC transfusion, 1 unit  of platelet transfusion and 4 units of FFP's.  Will monitor CBC INR and electrolytes closely.  GI on board.  Tagged RBC scan results pending more for prognostic purposes.  Continue PPI IV.   Addendum.  Tagged RBC scan results are back.  Bleeding seems to be localized in the large bowel in the splenic flexure, discussed with family, general surgery and IR.  Patient will be taken by IR for possible embolization.  Still overall condition is guarded, he can still get ischemic large bowel, still continues to be in DIC.  Will try for embolization and continue supportive care.  Overall prognosis still quite poor.  Family updated again.  Note discussed with IR patient will come out with an arterial sheath due to his coagulopathy, arterial sheath cannot be handled on 5 W. will transfer him to either ICU or another stepdown unit where arterial sheath can be managed.  Discussed with ICU physician Dr. Doyne Keel who agrees with this transfer.    2.  COPD.  Mild to moderate, supportive care continue.  3.  Hypothyroidism.  On Synthroid continue.  4.  Hypokalemia.  Replaced and remains stable.  5.  History of iron deficiency anemia.  Please see #1 above and #7 below.  6.  Underlying dysarthria and dysphagia with aspiration on 03/12/2018 by overzealous food administration by family-by speech currently on a soft diet - has been seen by speech, been adequately treated with Unasyn, underwent modified barium swallow with some oropharyngeal and epiglottic  abnormalities were noted, we could not get MRI as he has a Endo Clip which is not compatible with it, he got to CT scan which were nonacute.  He was seen by neurology who thought that he had gradually progressive neuromuscular degenerative disorder like Parkinson's which has been undiagnosed.  He has had long-standing gradually progressive dysarthria and now dysphagia which is exacerbated by acute illness.  Again detailed discussions with family.  No PEG tube or NG tube.  If unable to eat or drink comfort measures.  7.  Severe acute blood loss anemia, severe thrombocytopenia, auto anticoagulated state.  Likely due to acute heavy upper GI bleed, consumptive thrombocytopenia, likely also going in DIC.  Transfuse 4 units of packed RBC, 1 unit of platelet and 4 units of FFP's.  Monitor H&H.  No schistocytes seen however INR is high, fibrinogen is low.  Case discussed with hematologist Dr. Audelia Hives on call who suggested to continue the present line of care and that he has nothing else to offer.  Updated family.  Prognosis is guarded and poor.  They want medical management, if declines full comfort.  He will be DNR.  For now DC Unasyn and Pepcid due to thrombocytopenia and monitor.    Family Communication  :  Daughter and wife and multiple family members multiple times.  Code Status : DNR  Disposition Plan  : Stepdown  Consults  :  GI, hematology over the phone Dr. Audelia Hives.  Procedures  :    Modified barium swallow.  Patient presents with moderate oropharyngeal dysphagia with a reduced cricopharyngeal opening.  EGD 11/15 - Minimal gastritis. Minimal esophageal candidiasis. Nystatin swish and swallow. No source of anemia or abdominal pain seen. Suspect small bowel AVMs as source of anemia. Hold off on any further invasive GI procedures and manage conservatively. Clear liquid diet and advance as tolerated   DVT Prophylaxis  :   SCDs   Lab Results  Component Value Date   PLT 13 (LL) April 08, 2018  Diet :  Diet Order            DIET DYS 2 Room service appropriate? Yes; Fluid consistency: Thin  Diet effective now               Inpatient Medications Scheduled Meds: . sodium chloride   Intravenous Once  . sodium chloride   Intravenous Once  . feeding supplement (ENSURE ENLIVE)  237 mL Oral BID BM  . ferrous sulfate  325 mg Oral BID WC  . furosemide  40 mg Intravenous Once  . levothyroxine  50 mcg Oral Q0600  . multivitamin with minerals  1 tablet Oral Daily  . nystatin  5 mL Oral QID  . rosuvastatin  10 mg Oral Daily  . sucralfate  1 g Oral BID   Continuous Infusions: . dextrose 50 mL/hr at 03/25/18 1629  . pantoprozole (PROTONIX) infusion 8 mg/hr (03/25/18 2347)   PRN Meds:.acetaminophen, guaiFENesin-dextromethorphan, ipratropium-albuterol, nitroGLYCERIN  Antibiotics  :   Anti-infectives (From admission, onward)   Start     Dose/Rate Route Frequency Ordered Stop   04/06/2018 1830  ampicillin-sulbactam (UNASYN) 1.5 g in sodium chloride 0.9 % 100 mL IVPB  Status:  Discontinued     1.5 g 200 mL/hr over 30 Minutes Intravenous Every 6 hours 03/30/2018 1730 Apr 01, 2018 0845          Objective:   Vitals:   04/01/2018 0903 04-01-2018 1030 2018/04/01 1056 04/01/18 1125  BP: (!) 98/52  99/82 (!) 84/43  Pulse: 85  65 (!) 113  Resp: (!) 22  (!) 23   Temp:  97.6 F (36.4 C) 97.6 F (36.4 C) (!) 97.5 F (36.4 C)  TempSrc:  Axillary Axillary Axillary  SpO2: 90% (!) 89% 96% (!) 89%  Weight:      Height:        Wt Readings from Last 3 Encounters:  03/13/2018 54.4 kg  01/14/18 54.8 kg  11/22/17 55.9 kg     Intake/Output Summary (Last 24 hours) at 04-01-18 1339 Last data filed at 01-Apr-2018 1110 Gross per 24 hour  Intake 1634.42 ml  Output 300 ml  Net 1334.42 ml     Physical Exam  Awake appears tired [name].AT,PERRAL Supple Neck,No JVD, No cervical lymphadenopathy appriciated.  Symmetrical Chest wall movement, Good air movement bilaterally, coarse bilateral breath  sounds RRR,No Gallops, Rubs or new Murmurs, No Parasternal Heave +ve B.Sounds, Abd Soft, No tenderness, No organomegaly appriciated, No rebound - guarding or rigidity. No Cyanosis, Clubbing or edema, No new Rash or bruise    Data Review:    CBC Recent Labs  Lab 03/25/2018 1818  03/09/2018 0244 03/23/18 0413 03/24/18 0313 03/25/18 0019 04/01/18 0000 04/01/2018 0338 2018-04-01 0811  WBC 8.3   < > 12.6* 10.6* 10.4  --  8.5 8.4  --   HGB 6.2*   < > 8.7* 8.3* 7.8* 7.7* 5.2* 6.0* 7.3*  HCT 21.9*   < > 28.9* 26.8* 25.9* 25.1* 17.1* 20.3* 22.9*  PLT 410*   < > 329 284 185  --  13* 14* 13*  MCV 85.5   < > 83.3 83.5 84.6  --  83.8 84.2  --   MCH 24.2*   < > 25.1* 25.9* 25.5*  --  25.5* 24.9*  --   MCHC 28.3*   < > 30.1 31.0 30.1  --  30.4 29.6*  --   RDW 15.8*   < > 15.2 15.7* 16.0*  --  17.6* 17.2*  --   LYMPHSABS 0.8  --   --   --   --   --   --   --   --  MONOABS 1.2*  --   --   --   --   --   --   --   --   EOSABS 0.1  --   --   --   --   --   --   --   --   BASOSABS 0.1  --   --   --   --   --   --   --   --    < > = values in this interval not displayed.    Chemistries  Recent Labs  Lab 03/28/2018 1818  03/08/2018 0244 03/23/18 0413 03/24/18 0313 03/25/18 0019 04/03/18 0000 2018/04/03 0338  NA 137   < > 138 136 139 133* 141 141  K 3.3*   < > 3.4* 3.5 3.5 3.1* 3.6 3.6  CL 105   < > 106 106 107 103 112* 112*  CO2 23   < > 24 24 22 25  21* 21*  GLUCOSE 113*   < > 106* 100* 104* 135* 180* 163*  BUN 15   < > 8 11 15 21  41* 44*  CREATININE 0.84   < > 0.90 0.78 0.79 0.78 1.01 0.99  CALCIUM 8.7*   < > 8.3* 8.2* 8.5* 8.0* 7.6* 7.7*  MG  --   --  2.0  --   --   --   --  2.1  AST 20  --   --   --   --   --   --   --   ALT 9  --   --   --   --   --   --   --   ALKPHOS 170*  --   --   --   --   --   --   --   BILITOT 0.4  --   --   --   --   --   --   --    < > = values in this interval not displayed.    ------------------------------------------------------------------------------------------------------------------ No results for input(s): CHOL, HDL, LDLCALC, TRIG, CHOLHDL, LDLDIRECT in the last 72 hours.  Lab Results  Component Value Date   HGBA1C 5.5 09/09/2014   ------------------------------------------------------------------------------------------------------------------ No results for input(s): TSH, T4TOTAL, T3FREE, THYROIDAB in the last 72 hours.  Invalid input(s): FREET3 ------------------------------------------------------------------------------------------------------------------ Recent Labs    03/25/18 0651  VITAMINB12 334  FOLATE 13.5  FERRITIN 175  TIBC 231*  IRON 34*  RETICCTPCT 2.0    Coagulation profile Recent Labs  Lab 04/02/2018 1818 April 03, 2018 0811  INR 1.13 2.20    Recent Labs    03-Apr-2018 0811  DDIMER >20.00*    Cardiac Enzymes No results for input(s): CKMB, TROPONINI, MYOGLOBIN in the last 168 hours.  Invalid input(s): CK ------------------------------------------------------------------------------------------------------------------    Component Value Date/Time   BNP 202.2 (H) 03/24/2018 6659    Micro Results No results found for this or any previous visit (from the past 240 hour(s)).  Radiology Reports Ct Head Wo Contrast  Result Date: 03/25/2018 CLINICAL DATA:  Focal neural deficit greater than 6 hours. Stroke suspected. EXAM: CT HEAD WITHOUT CONTRAST TECHNIQUE: Contiguous axial images were obtained from the base of the skull through the vertex without intravenous contrast. COMPARISON:  03/21/2018 FINDINGS: Brain: Stable age related cerebral atrophy, ventriculomegaly and periventricular white matter disease. No extra-axial fluid collections are identified. No CT findings for acute hemispheric infarction or intracranial hemorrhage. No mass lesions. The brainstem and cerebellum are normal. Vascular: Stable atherosclerotic  calcifications. No aneurysm or hyperdense vessels. Skull: No skull fracture or bone lesions. Sinuses/Orbits: The paranasal sinuses and mastoid air cells are clear. The globes are intact. Other: No scalp lesions or hematoma. IMPRESSION: 1. Stable age related cerebral atrophy, ventriculomegaly and periventricular white matter disease. 2. No acute intracranial findings or mass lesions. Electronically Signed   By: Marijo Sanes M.D.   On: 03/25/2018 11:49   Ct Head Wo Contrast  Result Date: 03/21/2018 CLINICAL DATA:  New onset slurred speech. History of hyperlipidemia. EXAM: CT HEAD WITHOUT CONTRAST TECHNIQUE: Contiguous axial images were obtained from the base of the skull through the vertex without intravenous contrast. COMPARISON:  None. FINDINGS: Mild motion degraded examination somewhat improved on repeat attempt. BRAIN: No intraparenchymal hemorrhage, mass effect nor midline shift. Mild disproportionate mesial temporal lobe atrophy. Bilateral inferior frontal lobe encephalomalacia. No hydrocephalus. Patchy supratentorial white matter hypodensities. No acute large vascular territory infarcts. No abnormal extra-axial fluid collections. Basal cisterns are patent. VASCULAR: Moderate calcific atherosclerosis of the carotid siphons. SKULL: No skull fracture. No significant scalp soft tissue swelling. SINUSES/ORBITS: Mild paranasal sinus mucosal thickening. Mastoid air cells are well aerated.The included ocular globes and orbital contents are non-suspicious. Status post bilateral ocular lens implants. OTHER: None. IMPRESSION: 1. No acute intracranial process on this motion degraded examination. 2. Advanced mesial temporal lobe volume loss associated with neuro degenerative syndromes. 3. Bifrontal encephalomalacia compatible with TBI. 4. Moderate chronic small vessel ischemic changes. Electronically Signed   By: Elon Alas M.D.   On: 03/21/2018 23:28   Dg Chest Port 1 View  Result Date:  April 04, 2018 CLINICAL DATA:  Worsening shortness of breath tonight. Decreased oxygen saturation. EXAM: PORTABLE CHEST 1 VIEW COMPARISON:  03/25/2018 FINDINGS: Postoperative changes in the mediastinum. Cardiac enlargement. Interstitial infiltrates throughout the right lung and in the left lower lung. Possibly chronic fibrosis with superimposed pneumonia or edema. Blunting of the left costophrenic angle may represent small effusion or thickened pleura. No pneumothorax. Emphysematous changes in the upper lungs. Calcification of the aorta. Metallic foreign bodies projected over the right humerus. IMPRESSION: Cardiac enlargement. Diffuse interstitial infiltrates throughout the right lung and in the left lower lung. Small left pleural effusion or pleural thickening. No change since previous study. Electronically Signed   By: Lucienne Capers M.D.   On: 04-Apr-2018 06:53   Dg Chest Port 1 View  Result Date: 03/25/2018 CLINICAL DATA:  Shortness of breath. History of COPD, coronary artery disease and previous MI, former smoker. EXAM: PORTABLE CHEST 1 VIEW COMPARISON:  Portable chest x-ray of March 23, 2018 FINDINGS: The lungs are well-expanded. The interstitial markings remain increased greatest on the right. There is a small left pleural effusion. There is pleural fluid versus pleural thickening along the left lateral pleural surface which is not new. The heart is top-normal in size. The pulmonary vascularity is not clearly engorged. The patient has undergone previous CABG. The thoracic aorta demonstrates mural calcification. IMPRESSION: Persistent diffusely increased interstitial markings bilaterally with relative sparing of the left upper lobe. Small left pleural effusion. Previous CABG. There has not been significant interval change since the earlier study. Electronically Signed   By: David  Martinique M.D.   On: 03/25/2018 07:19   Dg Chest Port 1 View  Result Date: 03/23/2018 CLINICAL DATA:  Shortness of breath  EXAM: PORTABLE CHEST 1 VIEW COMPARISON:  04/05/2018 FINDINGS: Cardiac shadow is enlarged in size. Postsurgical changes are again identified. Diffuse interstitial opacity is again identified on the right stable from the prior exam. Similar  changes are noted in the left base. Pleural based density is again noted in the left apex laterally stable from the prior exam. No new focal infiltrate is seen. IMPRESSION: Stable appearance of the chest when compare with the previous day. No new focal abnormality is seen. Electronically Signed   By: Inez Catalina M.D.   On: 03/23/2018 08:13   Dg Chest Port 1 View  Result Date: 03/18/2018 CLINICAL DATA:  84 year old male with shortness of breath. EXAM: PORTABLE CHEST 1 VIEW COMPARISON:  Portable chest 03/09/2018 and earlier. FINDINGS: Portable AP semi upright view at 1651 hours. Chronic lung disease, with a combination of emphysema and fibrosis demonstrated on prior CTs. Superimposed acute interstitial opacity throughout the right lung and also likely at the left lung base. No superimposed pneumothorax. No pleural effusion is evident. Stable cardiac size and mediastinal contours. Visualized tracheal air column is within normal limits. Negative visible bowel gas pattern. Peripheral soft tissue thickening in the left upper lung may indicate progression of the hypermetabolic lesion seen on PET-CT 03/01/8526, uncertain. IMPRESSION: 1. Right greater than left interstitial opacity superimposed on chronic lung disease could reflect asymmetric pulmonary edema, viral or atypical respiratory infection. 2. Increased peripheral soft tissue thickening in the left upper lung may indicate progression of the hypermetabolic lesion seen on PET-CT 10/17/2017. 3. 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 Electronically Signed   By: Genevie Ann M.D.   On: 04/03/2018 17:03   Dg Chest Port 1 View  Result Date: 03/18/2018 CLINICAL DATA:  Initial evaluation for shortness of  breath. History of COPD. EXAM: PORTABLE CHEST 1 VIEW COMPARISON:  Prior radiograph from 11/05/2017. FINDINGS: Median sternotomy wires noted. Underlying cardiomegaly, unchanged. Coronary stent noted. Mediastinal silhouette normal. Aortic atherosclerosis. Lungs hypoinflated. Underlying COPD with irregular biapical pleuroparenchymal scarring, right greater than left. Small layering bilateral pleural effusions, left greater than right. Associated bibasilar opacities likely atelectasis, although infiltrates could be considered in the correct clinical setting. No overt pulmonary edema. No pneumothorax. No acute osseous abnormality. IMPRESSION: 1. Small bilateral pleural effusions, left greater than right. Associated bibasilar opacities likely reflect atelectasis, although infiltrates could be considered in the correct clinical setting. 2. Cardiomegaly with underlying COPD. 3. Aortic atherosclerosis. Electronically Signed   By: Jeannine Boga M.D.   On: 03/23/2018 18:48   Dg Duanne Limerick W/small Bowel  Result Date: 03/08/2018 CLINICAL DATA:  Generalized abdominal pain. History of COPD and biopsy-proven left lower lobe lung cancer. EXAM: UPPER GI SERIES WITH SMALL BOWEL FOLLOW-THROUGH FLUOROSCOPY TIME:  Fluoroscopy Time:  3 minutes 12 seconds Radiation Exposure Index (if provided by the fluoroscopic device): 217 mGy Number of Acquired Spot Images: 14 TECHNIQUE: Combined double contrast and single contrast upper GI series using effervescent crystals, thick barium, and thin barium. Subsequently, serial images of the small bowel were obtained including spot views of the terminal ileum. COMPARISON:  01/25/2018 CT abdomen.  10/17/2017 PET-CT. FINDINGS: Scout radiograph demonstrates no dilated small bowel loops. Moderate colonic stool. No evidence of pneumatosis or pneumoperitoneum. No radiopaque nephrolithiasis. Evaluation is significantly limited by patient's mobility restrictions. Specifically, evaluation was limited to  supine and anterior oblique imaging (patient unable to lie prone or stand for significant period of time). Esophageal motility is grossly normal. No evidence of hiatal hernia. Mild gastroesophageal reflux was elicited to the level of lower thoracic esophagus with water siphon test. Normal esophageal distensibility, with no evidence of esophageal mass, stricture or ulcer. No evidence of gastric fold thickening, ulcers or filling defects. Normal gastric  emptying. Normal duodenal sweep with no evidence of duodenal fold thickening, strictures, ulcers or filling defects. Normal duodenal jejunal junction to the left of the spine. There is normal small bowel transit time of 50 minutes. Normal caliber of the small bowel loops with no small bowel fold thickening, focal caliber changes or fixed positioning. No small bowel filling defects or fistulous. Terminal ileum appears normal. IMPRESSION: 1. Evaluation limited by patient mobility restrictions, see comments. 2. Mild gastroesophageal reflux elicited.  No hiatal hernia. 3. Otherwise unremarkable limited upper GI and small-bowel follow-through. Normal esophageal, gastric and small bowel motility. No evidence of peptic ulcer disease. No evidence of small-bowel obstruction. Electronically Signed   By: Ilona Sorrel M.D.   On: 03/08/2018 10:57   Dg Swallowing Func-speech Pathology  Result Date: 03/24/2018 Objective Swallowing Evaluation: Type of Study: MBS-Modified Barium Swallow Study  Patient Details Name: Timothy BARICH Sr. MRN: [id number] Date of Birth: 02-25-1934 Today's Date: 03/24/2018 Time: SLP Start Time (ACUTE ONLY): 1030 -SLP Stop Time (ACUTE ONLY): 1055 SLP Time Calculation (min) (ACUTE ONLY): 25 min Past Medical History: Past Medical History: Diagnosis Date . Anemia  . Anginal pain ([place])  . Complication of anesthesia 1987  "didn't wake up for 5 days" . COPD (chronic obstructive pulmonary disease) ([place])  . Coronary artery disease  . Exertional dyspnea 01/04/2012 .  GI bleed due to NSAIDs 02/2012 . High cholesterol  . History of blood transfusion 1987; 11/25/2014; 03/15/2018  "emergent CABG; anemia; anemia" . Hyperlipidemia  . Hypothyroidism  . Myocardial infarction ([place]) 1987 . Numbness and tingling in hands 01/04/2012  "right one goes to sleep on steering wheel when I'm driving; been that way long time; @ least 1 yr" . Symptomatic anemia 03/13/2018 . Thyroid disease  Past Surgical History: Past Surgical History: Procedure Laterality Date . CARDIAC CATHETERIZATION   . CARDIAC CATHETERIZATION N/A 09/10/2014  Procedure: Left Heart Cath and Cors/Grafts Angiography;  Surgeon: Peter M Martinique, MD;  Location: [facility];  Service: Cardiovascular;  Laterality: N/A; . CATARACT EXTRACTION W/ INTRAOCULAR LENS  IMPLANT, BILATERAL Bilateral 2012 . COLONOSCOPY N/A 12/04/2014  Procedure: COLONOSCOPY;  Surgeon: Teena Irani, MD;  Location: [facility] ENDOSCOPY;  Service: Endoscopy;  Laterality: N/A; . COLONOSCOPY W/ POLYPECTOMY  2011  "had to take it out in 3 pieces; all benign" . CORONARY ANGIOPLASTY WITH STENT PLACEMENT  ? 1990's  "3" . CORONARY ANGIOPLASTY WITH STENT PLACEMENT  01/04/2012  "2; total of 5" . CORONARY ARTERY BYPASS GRAFT  1987  CABG X1 . ESOPHAGOGASTRODUODENOSCOPY  02/07/2012  Procedure: ESOPHAGOGASTRODUODENOSCOPY (EGD);  Surgeon: Wonda Horner, MD;  Location: [facility] ENDOSCOPY;  Service: Endoscopy;  Laterality: N/A; . ESOPHAGOGASTRODUODENOSCOPY (EGD) WITH PROPOFOL Left 11/26/2014  Procedure: ESOPHAGOGASTRODUODENOSCOPY (EGD) WITH PROPOFOL;  Surgeon: Arta Silence, MD;  Location: [facility] ENDOSCOPY;  Service: Endoscopy;  Laterality: Left; . PERCUTANEOUS CORONARY STENT INTERVENTION (PCI-S) N/A 01/04/2012  Procedure: PERCUTANEOUS CORONARY STENT INTERVENTION (PCI-S);  Surgeon: Sinclair Grooms, MD;  Location: [facility] CATH LAB;  Service: Cardiovascular;  Laterality: N/A; . WRIST FRACTURE SURGERY Left ~ 2009 . WRIST HARDWARE REMOVAL Left ~ 2010  "took steel bar out" HPI: Pt is an 84 yo male with PMH  of chronic recurrent anemia secondary to GI bleed due to NSAID use, recurrent abdominal pain, HLD, CAD, hypothyroidism, lung cancer with previous radiation therapy ending in 2019, and severe COPD, who was admitted for symptomatic anemia and SOB. EGD 11/15 showed esophageal plaques concerning for candidiasis, widely patent Schatzkis ring, food residue in the stomach,  and a small HH.  Subjective: pt alert, speech is significantly slurred Assessment / Plan / Recommendation CH[facility] IP CLINICAL IMPRESSIONS 03/24/2018 Clinical Impression Patient presents with moderate oropharyngeal dysphagia with a reduced cricopharyngeal opening. He required verbal cueing to swallow. He had adequate mastication with poor bolus formation, reduced anterior to posterior transfer and reduced lingual elevation evidenced by mild oral residue.  With all consistencies the swallow was triggered at the valleculae with no epiglottic inversion (epiglottis cannot clear cervical vertebrae) resulting in vallecula pooling (improved by liquid wash). Patient is attempting to compensate absent epiglottic inversion with a hard swallow/pharyngeal squeezing). Pyriform sinus pooling was noted due to reduced cricopharyngus opeing and early closure. There was only one incidence of layngeal penetration (10% of bolus) that went below the cords and then ejected fully. Recommend Dys 2 diet with thin liquids. Compensatory strategies to include 1. reduce environmental distractions, 2. full assistance to verbally cue patient to swallow, 3. present small sips and bites (alternating). Notified RN of recommendations. Completed education at bedside with large family, specifically pt's daughter, 2 sons and wife. Patient remains a moderate risk for aspiration. Speech therapy to follow up for diet tolerance, review aspiration risk and compensatory training.  SLP Visit Diagnosis Dysphagia, oropharyngeal phase (R13.12);Dysphagia, pharyngoesophageal phase (R13.14) Attention and  concentration deficit following -- Frontal lobe and executive function deficit following -- Impact on safety and function Moderate aspiration risk   [facility] IP TREATMENT RECOMMENDATION 03/24/2018 Treatment Recommendations Therapy as outlined in treatment plan below   Prognosis 03/24/2018 Prognosis for Safe Diet Advancement Fair Barriers to Reach Goals Severity of deficits Barriers/Prognosis Comment -- [facility] IP DIET RECOMMENDATION 03/24/2018 SLP Diet Recommendations Dysphagia 2 (Fine chop) solids;Thin liquid Liquid Administration via Cup;No straw Medication Administration Whole meds with puree Compensations Slow rate;Small sips/bites;Follow solids with liquid Postural Changes Remain semi-upright after after feeds/meals (Comment);Seated upright at 90 degrees   [facility] IP OTHER RECOMMENDATIONS 03/24/2018 Recommended Consults -- Oral Care Recommendations Oral care BID Other Recommendations --   [facility] IP FOLLOW UP RECOMMENDATIONS 03/24/2018 Follow up Recommendations Home health SLP   [facility] IP FREQUENCY AND DURATION 03/24/2018 Speech Therapy Frequency (ACUTE ONLY) min 2x/week Treatment Duration 2 weeks      [facility] IP ORAL PHASE 03/24/2018 Oral Phase Impaired Oral - Pudding Teaspoon Weak lingual manipulation;Incomplete tongue to palate contact;Reduced posterior propulsion Oral - Pudding Cup -- Oral - Honey Teaspoon -- Oral - Honey Cup -- Oral - Nectar Teaspoon -- Oral - Nectar Cup -- Oral - Nectar Straw -- Oral - Thin Teaspoon -- Oral - Thin Cup Weak lingual manipulation;Incomplete tongue to palate contact Oral - Thin Straw -- Oral - Puree -- Oral - Mech Soft Weak lingual manipulation;Incomplete tongue to palate contact;Reduced posterior propulsion Oral - Regular -- Oral - Multi-Consistency -- Oral - Pill -- Oral Phase - Comment --  [facility] IP PHARYNGEAL PHASE 03/24/2018 Pharyngeal Phase Impaired Pharyngeal- Pudding Teaspoon Reduced epiglottic inversion;Delayed swallow initiation-vallecula;Reduced laryngeal elevation;Reduced airway/laryngeal  closure;Reduced tongue base retraction;Pharyngeal residue - valleculae;Pharyngeal residue - pyriform Pharyngeal -- Pharyngeal- Pudding Cup -- Pharyngeal -- Pharyngeal- Honey Teaspoon -- Pharyngeal -- Pharyngeal- Honey Cup -- Pharyngeal -- Pharyngeal- Nectar Teaspoon -- Pharyngeal -- Pharyngeal- Nectar Cup -- Pharyngeal -- Pharyngeal- Nectar Straw -- Pharyngeal -- Pharyngeal- Thin Teaspoon -- Pharyngeal -- Pharyngeal- Thin Cup Delayed swallow initiation-vallecula;Reduced epiglottic inversion;Reduced anterior laryngeal mobility;Reduced laryngeal elevation;Reduced airway/laryngeal closure;Reduced tongue base retraction;Trace aspiration;Pharyngeal residue - valleculae;Pharyngeal residue - pyriform Pharyngeal -- Pharyngeal- Thin Straw -- Pharyngeal -- Pharyngeal- Puree -- Pharyngeal -- Pharyngeal- Mechanical Soft  Delayed swallow initiation-vallecula;Reduced epiglottic inversion;Reduced anterior laryngeal mobility;Reduced laryngeal elevation;Reduced airway/laryngeal closure;Reduced tongue base retraction;Pharyngeal residue - valleculae;Pharyngeal residue - pyriform Pharyngeal -- Pharyngeal- Regular -- Pharyngeal -- Pharyngeal- Multi-consistency -- Pharyngeal -- Pharyngeal- Pill -- Pharyngeal -- Pharyngeal Comment --  [facility] IP CERVICAL ESOPHAGEAL PHASE 03/24/2018 Cervical Esophageal Phase Impaired Pudding Teaspoon Reduced cricopharyngeal relaxation Pudding Cup -- Honey Teaspoon -- Honey Cup -- Nectar Teaspoon -- Nectar Cup -- Nectar Straw -- Thin Teaspoon -- Thin Cup Reduced cricopharyngeal relaxation Thin Straw -- Puree -- Mechanical Soft Reduced cricopharyngeal relaxation Regular -- Multi-consistency -- Pill -- Cervical Esophageal Comment EGD showed esophagitis and patient has know Schatcki's ring, reduced cricopharygeal opening [place], MA, CCC-SLP 03/24/2018 12:16 PM               Time Spent in minutes  30   Lala Lund M.D on 28-Mar-2018 at 1:39 PM  To page go to [email or web address] - password [facility]

## 2018-04-07 NOTE — Progress Notes (Signed)
I responded to a page from the nurse to provide spiritual support for family members. I visited the patient's room with his wife of 53 years and many family members present. I provided spiritual support by listening with compassion as family members shared their expressions of grief and loss. I led in prayer and remained present with them for as [name] as they needed pastoral care.    24-Apr-2018 1715  Clinical Encounter Type  Visited With Patient and family together  Visit Type Spiritual support;Death  Referral From Nurse  Consult/Referral To Chaplain  Spiritual Encounters  Spiritual Needs Grief support;Prayer  Stress Factors  Family Stress Factors Exhausted;Loss    Chaplain Dr Redgie Grayer

## 2018-04-07 NOTE — Progress Notes (Signed)
At the beginning of shift Pt was seating in chair with family around. Pt talk to me in slured speech but was alert and oriented. While the NT and I were cleaning pt to transfer him to bed, Pt became lethargy and had an extra large black stool with some red fresh blood. Put pt quickly in bed and notified [facility] and charge nurse. Pt became pale and O2 sat started to drop I the 88 with BP also dropping in the 80's. Received new order with included Bolus with 2 units on blood when receiving H&H result. Call on-call and asked if we can go head and transfused because pt became pale, lethargy and bp was increasing but not significantly after bolus. On-call agree and pt received 2 units of blood. Hgb came back at 5.2, will recheck after 2nd unit per MD order. Family at pt bed side and are very involved in pt care.

## 2018-04-07 NOTE — Progress Notes (Signed)
Went in this morning to see patient for hematochezia.  Daughter in room, and asked me to exit room while patient was actively having bowel movements.  Based on my discussion with her and the limited exam I was afforded,  patient is elderly, frail-appearing, and tachypneic in moderate distress.  Began having hematochezia around 2 am this morning; INR 2.2 and platelets 13k.  Drop SBP.    Assessment:  1.  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  Suspect mid-to-distal small bowel versus proximal colonic source. 2.  Significant coagulopathy and thrombocytopenia, highly likely contributing (or principal cause of) his bleeding. 3.  Chronically ill- and frail-appearing.  Plan:  1.  Advised blood transfusion, FFP and platelets.   2.  Once stabilized from this, plans for tagged RBC study.   3.  Patient is in absolutely no shape whatsoever for colonoscopy. 4.  Prognosis guarded; hopefully there is improvement/resolution of GI bleeding upon correction of thrombocytopenia and coagulopathy. 5.  Case reviewed with Dr. Candiss Norse.

## 2018-04-07 NOTE — Progress Notes (Signed)
SLP Cancellation Note  Patient Details Name: Timothy KLOSINSKI Sr. MRN: [id number] DOB: Oct 14, 1933   Cancelled treatment:       Reason Eval/Treat Not Completed: Medical issues which prohibited therapy. Pt requiring NRB today. Note plans for IR intervention. Will f/u as able.   [name] March 30, 2018, 4:03 PM  [name], M.A. [facility] Environmental education officer [phone] Office [phone]

## 2018-04-07 NOTE — Discharge Summary (Signed)
[facility] Death Note                                                                                                                                                                                               [name], is a 84 y.o. male, DOB - [birth year], [place] date - 03/15/2018   Admitting Physician Elwyn Reach, MD  Outpatient Primary MD for the patient is Mayra Neer, MD  LOS - 6  Chief Complaint  Patient presents with  . Abnormal Lab  . Abdominal Pain       Notification: Mayra Neer, MD notified of death of 04-24-2018   Date and Time of Death - April 24, 2016 at 16.57  Pronounced by - RN  History of present illness:   Timothy THORPE Sr. is a 84 y.o. male with a history of -   chronic recurrent anemia secondary to previous GI bleed, recurrent abdominal pain, hyperlipidemia, coronary artery disease, hypothyroidism who apparently was at his doctor's office today with complaint of exertional dyspnea, occasional weakness and dizziness, ankle swelling on and off,.  His lab work was done showing a hemoglobin of 6.  Patient denied any recent melena.  Denied any bright red blood per rectum. He did have epigastric pain and has H/O AVM bleeds in the past, likely went into DIC.  He was doing fairly well unfortunately early morning April 24, 2018 around 3 AM he started having bright red blood per rectum which was pretty heavy, this caused severe acute blood loss related anemia along with consumptive thrombocytopenia and possibly mild DIC.  GI was consulted, tagged RBC scan has been ordered, he is in very frail and poor underlying shape and not a candidate for colonoscopy or aggressive measures at this time except for transfusion.  Family updated bedside multiple times and they agree with this, he will be treated medically, if declines comfort measures.  This point I have initiated 4  units of packed RBC transfusion, 1 unit of platelet transfusion and 4 units of FFP's.  GI was on board.  Tagged RBC scan results came back.  Bleeding seems to be localized in the large bowel in the splenic flexure, discussed with family, general surgery and IR.  Patient was taken by IR for possible embolization.  He went to IT for an emergent embolization, however he went into asystole due to extreme blood loss and hypotension.    Final Diagnoses:  Cause if death - LGI Bleed  Signature  Lala Lund M.D on 04/24/2018 at 5:04 PM  [facility]  Office Phone -[phone]  Total clinical and documentation time for  today Under 30 minutes   Last Note              PROGRESS NOTE                                                                                                                                                                                                             Patient Demographics:    [name], is a 84 y.o. male, DOB - 1934/02/26, GYF:[id number]  Admit date - 03/29/2018   Admitting Physician Elwyn Reach, MD  Outpatient Primary MD for the patient is Mayra Neer, MD  LOS - 6  Chief Complaint  Patient presents with  . Abnormal Lab  . Abdominal Pain       Brief Narrative  Timothy BEAVERS Sr. is a 84 y.o. male with medical history significant of chronic recurrent anemia secondary to previous GI bleed, recurrent abdominal pain, hyperlipidemia, coronary artery disease, hypothyroidism who apparently was at his doctor's office today with complaint of exertional dyspnea, occasional weakness and dizziness, ankle swelling on and off,.  His lab work was done showing a hemoglobin of 6.  Patient denied any recent melena.  Denied any bright red blood per rectum. He did have epigastric pain and has H/O AVM bleeds in the past, likely now in DIC.    Subjective:   Patient says he feels weak and mildly short of breath but denies any headache chest or abdominal  pain.  No focal weakness.   Assessment  & Plan :      1.Symptomatic acute on chronic iron deficiency anemia due to blood loss related anemia with epigastric pain and history of AVM bleeds in the past.  Likely occurred subacute upper GI bleed -seen by GI and underwent EGD on 03/10/2018 showing minimal gastritis and possible mild esophagitis for which she was placed on nystatin swish and swallow and PPI along with Zantac.  He had received 2 units of packed RBC transfusion earlier this admission.  He was doing fairly well unfortunately early morning 04-Apr-2018 around 3 AM he started having bright red blood per rectum which was pretty heavy, this caused severe acute blood loss related anemia along with consumptive thrombocytopenia and possibly mild DIC.  GI was consulted, tagged RBC scan has been ordered, he is in very frail and poor underlying shape and not a candidate for colonoscopy or aggressive measures at this time except for transfusion.  Family updated bedside multiple times and they agree with this, he will be treated medically, if declines comfort measures.  This point I have initiated 4 units of packed RBC transfusion, 1 unit of platelet transfusion and 4 units of FFP's.  Will monitor CBC INR and electrolytes closely.  GI on board.  Tagged RBC scan results pending more for prognostic purposes.  Continue PPI IV.   Addendum.  Tagged RBC scan results are back.  Bleeding seems to be localized in the large bowel in the splenic flexure, discussed with family, general surgery and IR.  Patient will be taken by IR for possible embolization.  Still overall condition is guarded, he can still get ischemic large bowel, still continues to be in DIC.  Will try for embolization and continue supportive care.  Overall prognosis still quite poor.  Family updated again.  Note discussed with IR patient will come out with an arterial sheath due to his coagulopathy, arterial sheath cannot be handled on 5 W. will  transfer him to either ICU or another stepdown unit where arterial sheath can be managed.  Discussed with ICU physician Dr. Doyne Keel who agrees with this transfer.    2.  COPD.  Mild to moderate, supportive care continue.  3.  Hypothyroidism.  On Synthroid continue.  4.  Hypokalemia.  Replaced and remains stable.  5.  History of iron deficiency anemia.  Please see #1 above and #7 below.  6.  Underlying dysarthria and dysphagia with aspiration on 03/31/2018 by overzealous food administration by family-by speech currently on a soft diet - has been seen by speech, been adequately treated with Unasyn, underwent modified barium swallow with some oropharyngeal and epiglottic abnormalities were noted, we could not get MRI as he has a Endo Clip which is not compatible with it, he got to CT scan which were nonacute.  He was seen by neurology who thought that he had gradually progressive neuromuscular degenerative disorder like Parkinson's which has been undiagnosed.  He has had long-standing gradually progressive dysarthria and now dysphagia which is exacerbated by acute illness.  Again detailed discussions with family.  No PEG tube or NG tube.  If unable to eat or drink comfort measures.  7.  Severe acute blood loss anemia, severe thrombocytopenia, auto anticoagulated state.  Likely due to acute heavy upper GI bleed, consumptive thrombocytopenia, likely also going in DIC.  Transfuse 4 units of packed RBC, 1 unit of platelet and 4 units of FFP's.  Monitor H&H.  No schistocytes seen however INR is high, fibrinogen is low.  Case discussed with hematologist Dr. Audelia Hives on call who suggested to continue the present line of care and that he has nothing else to offer.  Updated family.  Prognosis is guarded and poor.  They want medical management, if declines full comfort.  He will be DNR.  For now DC Unasyn and Pepcid due to thrombocytopenia and monitor.    Family Communication  :  Daughter and wife and multiple  family members multiple times.  Code Status : DNR  Disposition Plan  : Stepdown  Consults  :  GI, hematology over the phone Dr. Audelia Hives.  Procedures  :    Modified barium swallow.  Patient presents with moderate oropharyngeal dysphagia with a reduced cricopharyngeal opening.  EGD 11/15 - Minimal gastritis. Minimal esophageal candidiasis. Nystatin swish and swallow. No source of anemia or abdominal pain seen. Suspect small bowel AVMs as source of anemia. Hold off on any further invasive GI procedures and manage conservatively. Clear liquid diet and advance as tolerated   DVT Prophylaxis  :   SCDs   Lab  Results  Component Value Date   PLT 13 (LL) 04/14/2018    Diet :  Diet Order            DIET DYS 2 Room service appropriate? Yes; Fluid consistency: Thin  Diet effective now               Inpatient Medications Scheduled Meds: . sodium chloride   Intravenous Once  . feeding supplement (ENSURE ENLIVE)  237 mL Oral BID BM  . ferrous sulfate  325 mg Oral BID WC  . furosemide  40 mg Intravenous Once  . iopamidol      . levothyroxine  50 mcg Oral Q0600  . lidocaine      . multivitamin with minerals  1 tablet Oral Daily  . nystatin  5 mL Oral QID  . rosuvastatin  10 mg Oral Daily  . sucralfate  1 g Oral BID   Continuous Infusions: . dextrose 50 mL/hr at 03/25/18 1629  . pantoprozole (PROTONIX) infusion 8 mg/hr (03/25/18 2347)   PRN Meds:.acetaminophen, guaiFENesin-dextromethorphan, ipratropium-albuterol, nitroGLYCERIN  Antibiotics  :   Anti-infectives (From admission, onward)   Start     Dose/Rate Route Frequency Ordered Stop   04/05/2018 1830  ampicillin-sulbactam (UNASYN) 1.5 g in sodium chloride 0.9 % 100 mL IVPB  Status:  Discontinued     1.5 g 200 mL/hr over 30 Minutes Intravenous Every 6 hours 03/30/2018 1730 April 14, 2018 0845          Objective:   Vitals:   2018-04-14 1515 Apr 14, 2018 1620 04-14-2018 1625 2018/04/14 1630  BP:  (!) (P) 54/36  (!) 58/36  Pulse:  (P) 78  68   Resp:    (!) 28  Temp: (!) 97.4 F (36.3 C) (P) 97.8 F (36.6 C) 97.8 F (36.6 C)   TempSrc: Axillary (P) Axillary    SpO2:    (!) 85%  Weight:      Height:        Wt Readings from Last 3 Encounters:  04/05/2018 54.4 kg  01/14/18 54.8 kg  11/22/17 55.9 kg     Intake/Output Summary (Last 24 hours) at 2018/04/14 1704 Last data filed at Apr 14, 2018 1620 Gross per 24 hour  Intake 885 ml  Output 300 ml  Net 585 ml     Physical Exam  Awake appears tired [place].AT,PERRAL Supple Neck,No JVD, No cervical lymphadenopathy appriciated.  Symmetrical Chest wall movement, Good air movement bilaterally, coarse bilateral breath sounds RRR,No Gallops, Rubs or new Murmurs, No Parasternal Heave +ve B.Sounds, Abd Soft, No tenderness, No organomegaly appriciated, No rebound - guarding or rigidity. No Cyanosis, Clubbing or edema, No new Rash or bruise    Data Review:    CBC Recent Labs  Lab 03/14/2018 1818  03/13/2018 0244 03/23/18 0413 03/24/18 0313 03/25/18 0019 04/14/2018 0000 14-Apr-2018 0338 2018-04-14 0811  WBC 8.3   < > 12.6* 10.6* 10.4  --  8.5 8.4  --   HGB 6.2*   < > 8.7* 8.3* 7.8* 7.7* 5.2* 6.0* 7.3*  HCT 21.9*   < > 28.9* 26.8* 25.9* 25.1* 17.1* 20.3* 22.9*  PLT 410*   < > 329 284 185  --  13* 14* 13*  MCV 85.5   < > 83.3 83.5 84.6  --  83.8 84.2  --   MCH 24.2*   < > 25.1* 25.9* 25.5*  --  25.5* 24.9*  --   MCHC 28.3*   < > 30.1 31.0 30.1  --  30.4 29.6*  --  RDW 15.8*   < > 15.2 15.7* 16.0*  --  17.6* 17.2*  --   LYMPHSABS 0.8  --   --   --   --   --   --   --   --   MONOABS 1.2*  --   --   --   --   --   --   --   --   EOSABS 0.1  --   --   --   --   --   --   --   --   BASOSABS 0.1  --   --   --   --   --   --   --   --    < > = values in this interval not displayed.    Chemistries  Recent Labs  Lab 03/16/2018 1818  03/09/2018 0244 03/23/18 0413 03/24/18 0313 03/25/18 0019 03-31-2018 0000 March 31, 2018 0338  NA 137   < > 138 136 139 133* 141 141  K 3.3*   < > 3.4* 3.5 3.5  3.1* 3.6 3.6  CL 105   < > 106 106 107 103 112* 112*  CO2 23   < > 24 24 22 25  21* 21*  GLUCOSE 113*   < > 106* 100* 104* 135* 180* 163*  BUN 15   < > 8 11 15 21  41* 44*  CREATININE 0.84   < > 0.90 0.78 0.79 0.78 1.01 0.99  CALCIUM 8.7*   < > 8.3* 8.2* 8.5* 8.0* 7.6* 7.7*  MG  --   --  2.0  --   --   --   --  2.1  AST 20  --   --   --   --   --   --   --   ALT 9  --   --   --   --   --   --   --   ALKPHOS 170*  --   --   --   --   --   --   --   BILITOT 0.4  --   --   --   --   --   --   --    < > = values in this interval not displayed.   ------------------------------------------------------------------------------------------------------------------ No results for input(s): CHOL, HDL, LDLCALC, TRIG, CHOLHDL, LDLDIRECT in the last 72 hours.  Lab Results  Component Value Date   HGBA1C 5.5 09/09/2014   ------------------------------------------------------------------------------------------------------------------ No results for input(s): TSH, T4TOTAL, T3FREE, THYROIDAB in the last 72 hours.  Invalid input(s): FREET3 ------------------------------------------------------------------------------------------------------------------ Recent Labs    03/25/18 0651  VITAMINB12 334  FOLATE 13.5  FERRITIN 175  TIBC 231*  IRON 34*  RETICCTPCT 2.0    Coagulation profile Recent Labs  Lab 03/25/2018 1818 March 31, 2018 0811  INR 1.13 2.20    Recent Labs    2018-03-31 0811  DDIMER >20.00*    Cardiac Enzymes No results for input(s): CKMB, TROPONINI, MYOGLOBIN in the last 168 hours.  Invalid input(s): CK ------------------------------------------------------------------------------------------------------------------    Component Value Date/Time   BNP 202.2 (H) 03/24/2018 5277    Micro Results No results found for this or any previous visit (from the past 240 hour(s)).  Radiology Reports Ct Head Wo Contrast  Result Date: 03/25/2018 CLINICAL DATA:  Focal neural deficit  greater than 6 hours. Stroke suspected. EXAM: CT HEAD WITHOUT CONTRAST TECHNIQUE: Contiguous axial images were obtained from the base of the skull through the vertex without intravenous  contrast. COMPARISON:  03/21/2018 FINDINGS: Brain: Stable age related cerebral atrophy, ventriculomegaly and periventricular white matter disease. No extra-axial fluid collections are identified. No CT findings for acute hemispheric infarction or intracranial hemorrhage. No mass lesions. The brainstem and cerebellum are normal. Vascular: Stable atherosclerotic calcifications. No aneurysm or hyperdense vessels. Skull: No skull fracture or bone lesions. Sinuses/Orbits: The paranasal sinuses and mastoid air cells are clear. The globes are intact. Other: No scalp lesions or hematoma. IMPRESSION: 1. Stable age related cerebral atrophy, ventriculomegaly and periventricular white matter disease. 2. No acute intracranial findings or mass lesions. Electronically Signed   By: Marijo Sanes M.D.   On: 03/25/2018 11:49   Ct Head Wo Contrast  Result Date: 03/21/2018 CLINICAL DATA:  New onset slurred speech. History of hyperlipidemia. EXAM: CT HEAD WITHOUT CONTRAST TECHNIQUE: Contiguous axial images were obtained from the base of the skull through the vertex without intravenous contrast. COMPARISON:  None. FINDINGS: Mild motion degraded examination somewhat improved on repeat attempt. BRAIN: No intraparenchymal hemorrhage, mass effect nor midline shift. Mild disproportionate mesial temporal lobe atrophy. Bilateral inferior frontal lobe encephalomalacia. No hydrocephalus. Patchy supratentorial white matter hypodensities. No acute large vascular territory infarcts. No abnormal extra-axial fluid collections. Basal cisterns are patent. VASCULAR: Moderate calcific atherosclerosis of the carotid siphons. SKULL: No skull fracture. No significant scalp soft tissue swelling. SINUSES/ORBITS: Mild paranasal sinus mucosal thickening. Mastoid air cells  are well aerated.The included ocular globes and orbital contents are non-suspicious. Status post bilateral ocular lens implants. OTHER: None. IMPRESSION: 1. No acute intracranial process on this motion degraded examination. 2. Advanced mesial temporal lobe volume loss associated with neuro degenerative syndromes. 3. Bifrontal encephalomalacia compatible with TBI. 4. Moderate chronic small vessel ischemic changes. Electronically Signed   By: Elon Alas M.D.   On: 03/21/2018 23:28   Nm Gi Blood Loss  Result Date: 04/11/18 CLINICAL DATA:  Upper GI bleed. Nine variceal. No source endoscopy. EXAM: NUCLEAR MEDICINE GASTROINTESTINAL BLEEDING SCAN TECHNIQUE: Sequential abdominal images were obtained following intravenous administration of Tc-63m labeled red blood cells. RADIOPHARMACEUTICALS:  24.6 tagged red blood cells mCi Tc-28m pertechnetate in-vitro labeled red cells. COMPARISON:  CT abdomen pelvis 01/20/2015 FINDINGS: Endoluminal accumulation tagged red blood cells in the LEFT upper quadrant at the initiation of the study. This activity loops cephalad in expected course of the LEFT splenic flexure and then extends inferiorly in tubular shaped towards the midline pelvis. This pattern is consistent with active gastrointestinal bleeding of the colon with initiation site in the distal transverse colon / LEFT colon splenic flexure. IMPRESSION: Active gastrointestinal bleeding of the large bowel localizing to the distal transverse colon / splenic flexure LEFT. These results will be called to the ordering clinician or representative by the Radiologist Assistant, and communication documented in the PACS or zVision Dashboard. Electronically Signed   By: Suzy Bouchard M.D.   On: 04/11/2018 14:29   Dg Chest Port 1 View  Result Date: Apr 11, 2018 CLINICAL DATA:  Worsening shortness of breath tonight. Decreased oxygen saturation. EXAM: PORTABLE CHEST 1 VIEW COMPARISON:  03/25/2018 FINDINGS: Postoperative changes  in the mediastinum. Cardiac enlargement. Interstitial infiltrates throughout the right lung and in the left lower lung. Possibly chronic fibrosis with superimposed pneumonia or edema. Blunting of the left costophrenic angle may represent small effusion or thickened pleura. No pneumothorax. Emphysematous changes in the upper lungs. Calcification of the aorta. Metallic foreign bodies projected over the right humerus. IMPRESSION: Cardiac enlargement. Diffuse interstitial infiltrates throughout the right lung and in the left lower lung. Small  left pleural effusion or pleural thickening. No change since previous study. Electronically Signed   By: Lucienne Capers M.D.   On: 04/17/2018 06:53   Dg Chest Port 1 View  Result Date: 03/25/2018 CLINICAL DATA:  Shortness of breath. History of COPD, coronary artery disease and previous MI, former smoker. EXAM: PORTABLE CHEST 1 VIEW COMPARISON:  Portable chest x-ray of March 23, 2018 FINDINGS: The lungs are well-expanded. The interstitial markings remain increased greatest on the right. There is a small left pleural effusion. There is pleural fluid versus pleural thickening along the left lateral pleural surface which is not new. The heart is top-normal in size. The pulmonary vascularity is not clearly engorged. The patient has undergone previous CABG. The thoracic aorta demonstrates mural calcification. IMPRESSION: Persistent diffusely increased interstitial markings bilaterally with relative sparing of the left upper lobe. Small left pleural effusion. Previous CABG. There has not been significant interval change since the earlier study. Electronically Signed   By: David  Martinique M.D.   On: 03/25/2018 07:19   Dg Chest Port 1 View  Result Date: 03/23/2018 CLINICAL DATA:  Shortness of breath EXAM: PORTABLE CHEST 1 VIEW COMPARISON:  04/05/2018 FINDINGS: Cardiac shadow is enlarged in size. Postsurgical changes are again identified. Diffuse interstitial opacity is again  identified on the right stable from the prior exam. Similar changes are noted in the left base. Pleural based density is again noted in the left apex laterally stable from the prior exam. No new focal infiltrate is seen. IMPRESSION: Stable appearance of the chest when compare with the previous day. No new focal abnormality is seen. Electronically Signed   By: Inez Catalina M.D.   On: 03/23/2018 08:13   Dg Chest Port 1 View  Result Date: 03/10/2018 CLINICAL DATA:  83 year old male with shortness of breath. EXAM: PORTABLE CHEST 1 VIEW COMPARISON:  Portable chest 03/14/2018 and earlier. FINDINGS: Portable AP semi upright view at 1651 hours. Chronic lung disease, with a combination of emphysema and fibrosis demonstrated on prior CTs. Superimposed acute interstitial opacity throughout the right lung and also likely at the left lung base. No superimposed pneumothorax. No pleural effusion is evident. Stable cardiac size and mediastinal contours. Visualized tracheal air column is within normal limits. Negative visible bowel gas pattern. Peripheral soft tissue thickening in the left upper lung may indicate progression of the hypermetabolic lesion seen on PET-CT 84/13/2440, uncertain. IMPRESSION: 1. Right greater than left interstitial opacity superimposed on chronic lung disease could reflect asymmetric pulmonary edema, viral or atypical respiratory infection. 2. Increased peripheral soft tissue thickening in the left upper lung may indicate progression of the hypermetabolic lesion seen on PET-CT 10/17/2017. 3. 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 Electronically Signed   By: Genevie Ann M.D.   On: 03/28/2018 17:03   Dg Chest Port 1 View  Result Date: 03/21/2018 CLINICAL DATA:  Initial evaluation for shortness of breath. History of COPD. EXAM: PORTABLE CHEST 1 VIEW COMPARISON:  Prior radiograph from 11/05/2017. FINDINGS: Median sternotomy wires noted. Underlying cardiomegaly, unchanged.  Coronary stent noted. Mediastinal silhouette normal. Aortic atherosclerosis. Lungs hypoinflated. Underlying COPD with irregular biapical pleuroparenchymal scarring, right greater than left. Small layering bilateral pleural effusions, left greater than right. Associated bibasilar opacities likely atelectasis, although infiltrates could be considered in the correct clinical setting. No overt pulmonary edema. No pneumothorax. No acute osseous abnormality. IMPRESSION: 1. Small bilateral pleural effusions, left greater than right. Associated bibasilar opacities likely reflect atelectasis, although infiltrates could be considered in the correct  clinical setting. 2. Cardiomegaly with underlying COPD. 3. Aortic atherosclerosis. Electronically Signed   By: Jeannine Boga M.D.   On: 03/27/2018 18:48   Dg Duanne Limerick W/small Bowel  Result Date: 03/08/2018 CLINICAL DATA:  Generalized abdominal pain. History of COPD and biopsy-proven left lower lobe lung cancer. EXAM: UPPER GI SERIES WITH SMALL BOWEL FOLLOW-THROUGH FLUOROSCOPY TIME:  Fluoroscopy Time:  3 minutes 12 seconds Radiation Exposure Index (if provided by the fluoroscopic device): 217 mGy Number of Acquired Spot Images: 14 TECHNIQUE: Combined double contrast and single contrast upper GI series using effervescent crystals, thick barium, and thin barium. Subsequently, serial images of the small bowel were obtained including spot views of the terminal ileum. COMPARISON:  01/25/2018 CT abdomen.  10/17/2017 PET-CT. FINDINGS: Scout radiograph demonstrates no dilated small bowel loops. Moderate colonic stool. No evidence of pneumatosis or pneumoperitoneum. No radiopaque nephrolithiasis. Evaluation is significantly limited by patient's mobility restrictions. Specifically, evaluation was limited to supine and anterior oblique imaging (patient unable to lie prone or stand for significant period of time). Esophageal motility is grossly normal. No evidence of hiatal hernia. Mild  gastroesophageal reflux was elicited to the level of lower thoracic esophagus with water siphon test. Normal esophageal distensibility, with no evidence of esophageal mass, stricture or ulcer. No evidence of gastric fold thickening, ulcers or filling defects. Normal gastric emptying. Normal duodenal sweep with no evidence of duodenal fold thickening, strictures, ulcers or filling defects. Normal duodenal jejunal junction to the left of the spine. There is normal small bowel transit time of 50 minutes. Normal caliber of the small bowel loops with no small bowel fold thickening, focal caliber changes or fixed positioning. No small bowel filling defects or fistulous. Terminal ileum appears normal. IMPRESSION: 1. Evaluation limited by patient mobility restrictions, see comments. 2. Mild gastroesophageal reflux elicited.  No hiatal hernia. 3. Otherwise unremarkable limited upper GI and small-bowel follow-through. Normal esophageal, gastric and small bowel motility. No evidence of peptic ulcer disease. No evidence of small-bowel obstruction. Electronically Signed   By: Ilona Sorrel M.D.   On: 03/08/2018 10:57   Dg Swallowing Func-speech Pathology  Result Date: 03/24/2018 Objective Swallowing Evaluation: Type of Study: MBS-Modified Barium Swallow Study  Patient Details Name: [name] Sr. MRN: [id number] Date of Birth: 1933/06/14 Today's Date: 03/24/2018 Time: SLP Start Time (ACUTE ONLY): 1030 -SLP Stop Time (ACUTE ONLY): 1055 SLP Time Calculation (min) (ACUTE ONLY): 25 min Past Medical History: Past Medical History: Diagnosis Date . Anemia  . Anginal pain ([place])  . Complication of anesthesia 1987  "didn't wake up for 5 days" . COPD (chronic obstructive pulmonary disease) ([place])  . Coronary artery disease  . Exertional dyspnea 01/04/2012 . GI bleed due to NSAIDs 02/2012 . High cholesterol  . History of blood transfusion 1987; 11/25/2014; 03/08/2018  "emergent CABG; anemia; anemia" . Hyperlipidemia  . Hypothyroidism  .  Myocardial infarction ([place]) 1987 . Numbness and tingling in hands 01/04/2012  "right one goes to sleep on steering wheel when I'm driving; been that way long time; @ least 1 yr" . Symptomatic anemia 03/23/2018 . Thyroid disease  Past Surgical History: Past Surgical History: Procedure Laterality Date . CARDIAC CATHETERIZATION   . CARDIAC CATHETERIZATION N/A 09/10/2014  Procedure: Left Heart Cath and Cors/Grafts Angiography;  Surgeon: Peter M Martinique, MD;  Location: [facility];  Service: Cardiovascular;  Laterality: N/A; . CATARACT EXTRACTION W/ INTRAOCULAR LENS  IMPLANT, BILATERAL Bilateral 2012 . COLONOSCOPY N/A 12/04/2014  Procedure: COLONOSCOPY;  Surgeon: Teena Irani, MD;  Location: [facility] ENDOSCOPY;  Service: Endoscopy;  Laterality: N/A; . COLONOSCOPY W/ POLYPECTOMY  2011  "had to take it out in 3 pieces; all benign" . CORONARY ANGIOPLASTY WITH STENT PLACEMENT  ? 1990's  "3" . CORONARY ANGIOPLASTY WITH STENT PLACEMENT  01/04/2012  "2; total of 5" . CORONARY ARTERY BYPASS GRAFT  1987  CABG X1 . ESOPHAGOGASTRODUODENOSCOPY  02/07/2012  Procedure: ESOPHAGOGASTRODUODENOSCOPY (EGD);  Surgeon: Wonda Horner, MD;  Location: [facility] ENDOSCOPY;  Service: Endoscopy;  Laterality: N/A; . ESOPHAGOGASTRODUODENOSCOPY (EGD) WITH PROPOFOL Left 11/26/2014  Procedure: ESOPHAGOGASTRODUODENOSCOPY (EGD) WITH PROPOFOL;  Surgeon: Arta Silence, MD;  Location: [facility] ENDOSCOPY;  Service: Endoscopy;  Laterality: Left; . PERCUTANEOUS CORONARY STENT INTERVENTION (PCI-S) N/A 01/04/2012  Procedure: PERCUTANEOUS CORONARY STENT INTERVENTION (PCI-S);  Surgeon: Sinclair Grooms, MD;  Location: [facility] CATH LAB;  Service: Cardiovascular;  Laterality: N/A; . WRIST FRACTURE SURGERY Left ~ 2009 . WRIST HARDWARE REMOVAL Left ~ 2010  "took steel bar out" HPI: Pt is an 84 yo male with PMH of chronic recurrent anemia secondary to GI bleed due to NSAID use, recurrent abdominal pain, HLD, CAD, hypothyroidism, lung cancer with previous radiation therapy ending in 2019,  and severe COPD, who was admitted for symptomatic anemia and SOB. EGD 11/15 showed esophageal plaques concerning for candidiasis, widely patent Schatzkis ring, food residue in the stomach, and a small HH.  Subjective: pt alert, speech is significantly slurred Assessment / Plan / Recommendation CHL IP CLINICAL IMPRESSIONS 03/24/2018 Clinical Impression Patient presents with moderate oropharyngeal dysphagia with a reduced cricopharyngeal opening. He required verbal cueing to swallow. He had adequate mastication with poor bolus formation, reduced anterior to posterior transfer and reduced lingual elevation evidenced by mild oral residue.  With all consistencies the swallow was triggered at the valleculae with no epiglottic inversion (epiglottis cannot clear cervical vertebrae) resulting in vallecula pooling (improved by liquid wash). Patient is attempting to compensate absent epiglottic inversion with a hard swallow/pharyngeal squeezing). Pyriform sinus pooling was noted due to reduced cricopharyngus opeing and early closure. There was only one incidence of layngeal penetration (10% of bolus) that went below the cords and then ejected fully. Recommend Dys 2 diet with thin liquids. Compensatory strategies to include 1. reduce environmental distractions, 2. full assistance to verbally cue patient to swallow, 3. present small sips and bites (alternating). Notified RN of recommendations. Completed education at bedside with large family, specifically pt's daughter, 2 sons and wife. Patient remains a moderate risk for aspiration. Speech therapy to follow up for diet tolerance, review aspiration risk and compensatory training.  SLP Visit Diagnosis Dysphagia, oropharyngeal phase (R13.12);Dysphagia, pharyngoesophageal phase (R13.14) Attention and concentration deficit following -- Frontal lobe and executive function deficit following -- Impact on safety and function Moderate aspiration risk   CHL IP TREATMENT RECOMMENDATION  03/24/2018 Treatment Recommendations Therapy as outlined in treatment plan below   Prognosis 03/24/2018 Prognosis for Safe Diet Advancement Fair Barriers to Reach Goals Severity of deficits Barriers/Prognosis Comment -- CHL IP DIET RECOMMENDATION 03/24/2018 SLP Diet Recommendations Dysphagia 2 (Fine chop) solids;Thin liquid Liquid Administration via Cup;No straw Medication Administration Whole meds with puree Compensations Slow rate;Small sips/bites;Follow solids with liquid Postural Changes Remain semi-upright after after feeds/meals (Comment);Seated upright at 90 degrees   CHL IP OTHER RECOMMENDATIONS 03/24/2018 Recommended Consults -- Oral Care Recommendations Oral care BID Other Recommendations --   CHL IP FOLLOW UP RECOMMENDATIONS 03/24/2018 Follow up Recommendations Home health SLP   CHL IP FREQUENCY AND DURATION 03/24/2018 Speech Therapy Frequency (ACUTE ONLY) min 2x/week Treatment Duration  2 weeks      CHL IP ORAL PHASE 03/24/2018 Oral Phase Impaired Oral - Pudding Teaspoon Weak lingual manipulation;Incomplete tongue to palate contact;Reduced posterior propulsion Oral - Pudding Cup -- Oral - Honey Teaspoon -- Oral - Honey Cup -- Oral - Nectar Teaspoon -- Oral - Nectar Cup -- Oral - Nectar Straw -- Oral - Thin Teaspoon -- Oral - Thin Cup Weak lingual manipulation;Incomplete tongue to palate contact Oral - Thin Straw -- Oral - Puree -- Oral - Mech Soft Weak lingual manipulation;Incomplete tongue to palate contact;Reduced posterior propulsion Oral - Regular -- Oral - Multi-Consistency -- Oral - Pill -- Oral Phase - Comment --  CHL IP PHARYNGEAL PHASE 03/24/2018 Pharyngeal Phase Impaired Pharyngeal- Pudding Teaspoon Reduced epiglottic inversion;Delayed swallow initiation-vallecula;Reduced laryngeal elevation;Reduced airway/laryngeal closure;Reduced tongue base retraction;Pharyngeal residue - valleculae;Pharyngeal residue - pyriform Pharyngeal -- Pharyngeal- Pudding Cup -- Pharyngeal -- Pharyngeal- Honey Teaspoon  -- Pharyngeal -- Pharyngeal- Honey Cup -- Pharyngeal -- Pharyngeal- Nectar Teaspoon -- Pharyngeal -- Pharyngeal- Nectar Cup -- Pharyngeal -- Pharyngeal- Nectar Straw -- Pharyngeal -- Pharyngeal- Thin Teaspoon -- Pharyngeal -- Pharyngeal- Thin Cup Delayed swallow initiation-vallecula;Reduced epiglottic inversion;Reduced anterior laryngeal mobility;Reduced laryngeal elevation;Reduced airway/laryngeal closure;Reduced tongue base retraction;Trace aspiration;Pharyngeal residue - valleculae;Pharyngeal residue - pyriform Pharyngeal -- Pharyngeal- Thin Straw -- Pharyngeal -- Pharyngeal- Puree -- Pharyngeal -- Pharyngeal- Mechanical Soft Delayed swallow initiation-vallecula;Reduced epiglottic inversion;Reduced anterior laryngeal mobility;Reduced laryngeal elevation;Reduced airway/laryngeal closure;Reduced tongue base retraction;Pharyngeal residue - valleculae;Pharyngeal residue - pyriform Pharyngeal -- Pharyngeal- Regular -- Pharyngeal -- Pharyngeal- Multi-consistency -- Pharyngeal -- Pharyngeal- Pill -- Pharyngeal -- Pharyngeal Comment --  CHL IP CERVICAL ESOPHAGEAL PHASE 03/24/2018 Cervical Esophageal Phase Impaired Pudding Teaspoon Reduced cricopharyngeal relaxation Pudding Cup -- Honey Teaspoon -- Honey Cup -- Nectar Teaspoon -- Nectar Cup -- Nectar Straw -- Thin Teaspoon -- Thin Cup Reduced cricopharyngeal relaxation Thin Straw -- Puree -- Mechanical Soft Reduced cricopharyngeal relaxation Regular -- Multi-consistency -- Pill -- Cervical Esophageal Comment EGD showed esophagitis and patient has know Schatcki's ring, reduced cricopharygeal opening [place], [place], CCC-SLP 03/24/2018 12:16 PM               Time Spent in minutes  30   Lala Lund M.D on 2018-04-01 at 5:04 PM  To page go to [email or web address] - password [facility]

## 2018-04-07 NOTE — Progress Notes (Signed)
CRITICAL VALUE ALERT  Critical Value: Fibrinogen- 90  Date & Time Notied: 04-13-18 1005  Provider Notified: Candiss Norse  Orders Received/Actions taken: N/A

## 2018-04-07 NOTE — Consult Note (Signed)
Chief Complaint: Patient was seen in consultation today for mesenteric arteriogram with possible intervention Chief Complaint  Patient presents with  . Abnormal Lab  . Abdominal Pain   at the request of Dr Margurite Auerbach  Supervising Physician: Aletta Edouard  Patient Status: [facility] - In-pt  History of Present Illness: Timothy SKILLIN Sr. is a 84 y.o. male    Hx Lung Ca; COPD; GI Bleed; anemia  Chronic anemia- previous GI Bleeds Complaining of exertional dyspnea; weakness; dizziness Hg 6: OP MD office 11/13 Hx AVM clipping 2013  EGD 11/15: no bleeding  Noted BRBPR 11/19 - 3 am Has had PRBCs and Plt transfusions  NM GI bleed study just now: Active gastrointestinal bleeding of the large bowel localizing to the distal transverse colon / splenic flexure LEFT.  Request for mesenteric arteriogram with possible embolization Dr Kathlene Cote has reviewed imaging and approves procedure  Past Medical History:  Diagnosis Date  . Anemia   . Anginal pain ([place])   . Complication of anesthesia 1987   "didn't wake up for 5 days"  . COPD (chronic obstructive pulmonary disease) ([place])   . Coronary artery disease   . Exertional dyspnea 01/04/2012  . GI bleed due to NSAIDs 02/2012  . High cholesterol   . History of blood transfusion 1987; 11/25/2014; 04/02/2018   "emergent CABG; anemia; anemia"  . Hyperlipidemia   . Hypothyroidism   . Myocardial infarction ([place]) 1987  . Numbness and tingling in hands 01/04/2012   "right one goes to sleep on steering wheel when I'm driving; been that way long time; @ least 1 yr"  . Symptomatic anemia 03/30/2018  . Thyroid disease     Past Surgical History:  Procedure Laterality Date  . CARDIAC CATHETERIZATION    . CARDIAC CATHETERIZATION N/A 09/10/2014   Procedure: Left Heart Cath and Cors/Grafts Angiography;  Surgeon: Peter M Martinique, MD;  Location: [place] INVASIVE CV LAB CUPID;  Service: Cardiovascular;  Laterality: N/A;  . CATARACT EXTRACTION W/ INTRAOCULAR  LENS  IMPLANT, BILATERAL Bilateral 2012  . COLONOSCOPY N/A 12/04/2014   Procedure: COLONOSCOPY;  Surgeon: Teena Irani, MD;  Location: [facility] ENDOSCOPY;  Service: Endoscopy;  Laterality: N/A;  . COLONOSCOPY W/ POLYPECTOMY  2011   "had to take it out in 3 pieces; all benign"  . CORONARY ANGIOPLASTY WITH STENT PLACEMENT  ? 1990's   "3"  . CORONARY ANGIOPLASTY WITH STENT PLACEMENT  01/04/2012   "2; total of 5"  . CORONARY ARTERY BYPASS GRAFT  1987   CABG X1  . ESOPHAGOGASTRODUODENOSCOPY  02/07/2012   Procedure: ESOPHAGOGASTRODUODENOSCOPY (EGD);  Surgeon: Wonda Horner, MD;  Location: [facility] ENDOSCOPY;  Service: Endoscopy;  Laterality: N/A;  . ESOPHAGOGASTRODUODENOSCOPY (EGD) WITH PROPOFOL Left 11/26/2014   Procedure: ESOPHAGOGASTRODUODENOSCOPY (EGD) WITH PROPOFOL;  Surgeon: Arta Silence, MD;  Location: [facility] ENDOSCOPY;  Service: Endoscopy;  Laterality: Left;  . ESOPHAGOGASTRODUODENOSCOPY (EGD) WITH PROPOFOL N/A 03/24/2018   Procedure: ESOPHAGOGASTRODUODENOSCOPY (EGD) WITH PROPOFOL;  Surgeon: Wilford Corner, MD;  Location: [place];  Service: Endoscopy;  Laterality: N/A;  . PERCUTANEOUS CORONARY STENT INTERVENTION (PCI-S) N/A 01/04/2012   Procedure: PERCUTANEOUS CORONARY STENT INTERVENTION (PCI-S);  Surgeon: Sinclair Grooms, MD;  Location: [facility] CATH LAB;  Service: Cardiovascular;  Laterality: N/A;  . WRIST FRACTURE SURGERY Left ~ 2009  . WRIST HARDWARE REMOVAL Left ~ 2010   "took steel bar out"    Allergies: Ativan [lorazepam]; Ibuprofen; Morphine and related; and Atorvastatin  Medications: Prior to Admission medications   Medication Sig Start Date End  Date Taking? Authorizing Provider  aspirin 81 MG tablet Take 81 mg by mouth daily.   Yes [provider]  levothyroxine (SYNTHROID, LEVOTHROID) 50 MCG tablet Take 50 mcg by mouth daily before breakfast.  01/30/16  Yes [provider]  Multiple Vitamin (MULTIVITAMIN WITH MINERALS) TABS Take 1 tablet by mouth daily.   Yes [provider]  NITROSTAT 0.4 MG SL tablet PLACE 1 TABLET (0.4 MG TOTAL) UNDER THE TONGUE EVERY 5 (FIVE) MINUTES AS NEEDED. FOR CHEST PAIN 05/18/16  Yes Jerline Pain, MD  pantoprazole (PROTONIX) 40 MG tablet Take 40 mg by mouth daily.   Yes [provider]  PROAIR HFA 108 (90 Base) MCG/ACT inhaler Inhale 2 puffs into the lungs every 4 (four) hours as needed. 02/21/18  Yes [provider]  rosuvastatin (CRESTOR) 10 MG tablet Take 1 tablet (10 mg total) by mouth daily. 06/27/17  Yes Jerline Pain, MD  sucralfate (CARAFATE) 1 g tablet Take 1 g by mouth 2 (two) times daily. 03/12/18  Yes [provider]  acetaminophen (TYLENOL) 325 MG tablet Take 2 tablets (650 mg total) by mouth every 4 (four) hours as needed for headache or mild pain. Patient not taking: Reported on 03/08/2018 09/10/14   Erlene Quan, PA-C  ferrous sulfate 325 (65 FE) MG tablet Take 1 tablet (325 mg total) by mouth 2 (two) times daily with a meal. Patient not taking: Reported on 03/08/2018 12/05/14   Thurnell Lose, MD     Family History  Problem Relation Age of Onset  . Emphysema Father        ? if he smoked or not     Social History   Socioeconomic History  . Marital status: Married    Spouse name: Not on file  . Number of children: Not on file  . Years of education: Not on file  . Highest education level: Not on file  Occupational History  . Not on file  Social Needs  . Financial resource strain: Not on file  . Food insecurity:    Worry: Not on file    Inability: Not on file  . Transportation needs:    Medical: Not on file    Non-medical: Not on file  Tobacco Use  . Smoking status: Former Smoker    Packs/day: 1.00    Years: 40.00    Pack years: 40.00    Types: Cigarettes    Last attempt to quit: 05/08/1988    Years since quitting: 29.9  . Smokeless tobacco: Never Used  Substance and Sexual Activity  . Alcohol use: No    Comment: 11/25/2014 "hadn't drank any alcohol in the  2000's"  . Drug use: No  . Sexual activity: Yes  Lifestyle  . Physical activity:    Days per week: Not on file    Minutes per session: Not on file  . Stress: Not on file  Relationships  . Social connections:    Talks on phone: Not on file    Gets together: Not on file    Attends religious service: Not on file    Active member of club or organization: Not on file    Attends meetings of clubs or organizations: Not on file    Relationship status: Not on file  Other Topics Concern  . Not on file  Social History Narrative   ** Merged History Encounter **        Review of Systems: A 12 point ROS discussed and  pertinent positives are indicated in the HPI above.  All other systems are negative.  Review of Systems  Constitutional: Positive for activity change, diaphoresis and fatigue.  Respiratory: Positive for shortness of breath.   Gastrointestinal: Negative for abdominal pain.  Psychiatric/Behavioral: Negative for behavioral problems and confusion.    Vital Signs: BP (!) 69/44   Pulse (!) 106   Temp (!) 97.5 F (36.4 C) (Axillary)   Resp (!) 29   Ht 5\' 8"  (1.727 m)   Wt 120 lb (54.4 kg)   SpO2 (!) 89%   BMI 18.25 kg/m   Physical Exam  Constitutional: He is oriented to person, place, and time.  Cardiovascular: Normal rate and regular rhythm.  Pulmonary/Chest: Effort normal.  shallow  Abdominal: There is tenderness.  Neurological: He is alert and oriented to person, place, and time.  Skin: Skin is warm and dry.  Psychiatric:  Consented with wife at bedside  Vitals reviewed.   Imaging: Ct Head Wo Contrast  Result Date: 03/25/2018 CLINICAL DATA:  Focal neural deficit greater than 6 hours. Stroke suspected. EXAM: CT HEAD WITHOUT CONTRAST TECHNIQUE: Contiguous axial images were obtained from the base of the skull through the vertex without intravenous contrast. COMPARISON:  03/21/2018 FINDINGS: Brain: Stable age related cerebral atrophy, ventriculomegaly and  periventricular white matter disease. No extra-axial fluid collections are identified. No CT findings for acute hemispheric infarction or intracranial hemorrhage. No mass lesions. The brainstem and cerebellum are normal. Vascular: Stable atherosclerotic calcifications. No aneurysm or hyperdense vessels. Skull: No skull fracture or bone lesions. Sinuses/Orbits: The paranasal sinuses and mastoid air cells are clear. The globes are intact. Other: No scalp lesions or hematoma. IMPRESSION: 1. Stable age related cerebral atrophy, ventriculomegaly and periventricular white matter disease. 2. No acute intracranial findings or mass lesions. Electronically Signed   By: Marijo Sanes M.D.   On: 03/25/2018 11:49   Ct Head Wo Contrast  Result Date: 03/21/2018 CLINICAL DATA:  New onset slurred speech. History of hyperlipidemia. EXAM: CT HEAD WITHOUT CONTRAST TECHNIQUE: Contiguous axial images were obtained from the base of the skull through the vertex without intravenous contrast. COMPARISON:  None. FINDINGS: Mild motion degraded examination somewhat improved on repeat attempt. BRAIN: No intraparenchymal hemorrhage, mass effect nor midline shift. Mild disproportionate mesial temporal lobe atrophy. Bilateral inferior frontal lobe encephalomalacia. No hydrocephalus. Patchy supratentorial white matter hypodensities. No acute large vascular territory infarcts. No abnormal extra-axial fluid collections. Basal cisterns are patent. VASCULAR: Moderate calcific atherosclerosis of the carotid siphons. SKULL: No skull fracture. No significant scalp soft tissue swelling. SINUSES/ORBITS: Mild paranasal sinus mucosal thickening. Mastoid air cells are well aerated.The included ocular globes and orbital contents are non-suspicious. Status post bilateral ocular lens implants. OTHER: None. IMPRESSION: 1. No acute intracranial process on this motion degraded examination. 2. Advanced mesial temporal lobe volume loss associated with neuro  degenerative syndromes. 3. Bifrontal encephalomalacia compatible with TBI. 4. Moderate chronic small vessel ischemic changes. Electronically Signed   By: Elon Alas M.D.   On: 03/21/2018 23:28   Nm Gi Blood Loss  Result Date: 17-Apr-2018 CLINICAL DATA:  Upper GI bleed. Nine variceal. No source endoscopy. EXAM: NUCLEAR MEDICINE GASTROINTESTINAL BLEEDING SCAN TECHNIQUE: Sequential abdominal images were obtained following intravenous administration of Tc-51m labeled red blood cells. RADIOPHARMACEUTICALS:  24.6 tagged red blood cells mCi Tc-59m pertechnetate in-vitro labeled red cells. COMPARISON:  CT abdomen pelvis 01/20/2015 FINDINGS: Endoluminal accumulation tagged red blood cells in the LEFT upper quadrant at the initiation of the study. This activity loops cephalad  in expected course of the LEFT splenic flexure and then extends inferiorly in tubular shaped towards the midline pelvis. This pattern is consistent with active gastrointestinal bleeding of the colon with initiation site in the distal transverse colon / LEFT colon splenic flexure. IMPRESSION: Active gastrointestinal bleeding of the large bowel localizing to the distal transverse colon / splenic flexure LEFT. These results will be called to the ordering clinician or representative by the Radiologist Assistant, and communication documented in the PACS or zVision Dashboard. Electronically Signed   By: Suzy Bouchard M.D.   On: 03/31/2018 14:29   Dg Chest Port 1 View  Result Date: 2018-03-31 CLINICAL DATA:  Worsening shortness of breath tonight. Decreased oxygen saturation. EXAM: PORTABLE CHEST 1 VIEW COMPARISON:  03/25/2018 FINDINGS: Postoperative changes in the mediastinum. Cardiac enlargement. Interstitial infiltrates throughout the right lung and in the left lower lung. Possibly chronic fibrosis with superimposed pneumonia or edema. Blunting of the left costophrenic angle may represent small effusion or thickened pleura. No pneumothorax.  Emphysematous changes in the upper lungs. Calcification of the aorta. Metallic foreign bodies projected over the right humerus. IMPRESSION: Cardiac enlargement. Diffuse interstitial infiltrates throughout the right lung and in the left lower lung. Small left pleural effusion or pleural thickening. No change since previous study. Electronically Signed   By: Lucienne Capers M.D.   On: Mar 31, 2018 06:53   Dg Chest Port 1 View  Result Date: 03/25/2018 CLINICAL DATA:  Shortness of breath. History of COPD, coronary artery disease and previous MI, former smoker. EXAM: PORTABLE CHEST 1 VIEW COMPARISON:  Portable chest x-ray of March 23, 2018 FINDINGS: The lungs are well-expanded. The interstitial markings remain increased greatest on the right. There is a small left pleural effusion. There is pleural fluid versus pleural thickening along the left lateral pleural surface which is not new. The heart is top-normal in size. The pulmonary vascularity is not clearly engorged. The patient has undergone previous CABG. The thoracic aorta demonstrates mural calcification. IMPRESSION: Persistent diffusely increased interstitial markings bilaterally with relative sparing of the left upper lobe. Small left pleural effusion. Previous CABG. There has not been significant interval change since the earlier study. Electronically Signed   By: David  Martinique M.D.   On: 03/25/2018 07:19   Dg Chest Port 1 View  Result Date: 03/23/2018 CLINICAL DATA:  Shortness of breath EXAM: PORTABLE CHEST 1 VIEW COMPARISON:  03/30/2018 FINDINGS: Cardiac shadow is enlarged in size. Postsurgical changes are again identified. Diffuse interstitial opacity is again identified on the right stable from the prior exam. Similar changes are noted in the left base. Pleural based density is again noted in the left apex laterally stable from the prior exam. No new focal infiltrate is seen. IMPRESSION: Stable appearance of the chest when compare with the  previous day. No new focal abnormality is seen. Electronically Signed   By: Inez Catalina M.D.   On: 03/23/2018 08:13   Dg Chest Port 1 View  Result Date: 03/25/2018 CLINICAL DATA:  84 year old male with shortness of breath. EXAM: PORTABLE CHEST 1 VIEW COMPARISON:  Portable chest 04/01/2018 and earlier. FINDINGS: Portable AP semi upright view at 1651 hours. Chronic lung disease, with a combination of emphysema and fibrosis demonstrated on prior CTs. Superimposed acute interstitial opacity throughout the right lung and also likely at the left lung base. No superimposed pneumothorax. No pleural effusion is evident. Stable cardiac size and mediastinal contours. Visualized tracheal air column is within normal limits. Negative visible bowel gas pattern. Peripheral soft tissue thickening  in the left upper lung may indicate progression of the hypermetabolic lesion seen on PET-CT 38/18/2993, uncertain. IMPRESSION: 1. Right greater than left interstitial opacity superimposed on chronic lung disease could reflect asymmetric pulmonary edema, viral or atypical respiratory infection. 2. Increased peripheral soft tissue thickening in the left upper lung may indicate progression of the hypermetabolic lesion seen on PET-CT 10/17/2017. 3. 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 Electronically Signed   By: Genevie Ann M.D.   On: 03/14/2018 17:03   Dg Chest Port 1 View  Result Date: 03/23/2018 CLINICAL DATA:  Initial evaluation for shortness of breath. History of COPD. EXAM: PORTABLE CHEST 1 VIEW COMPARISON:  Prior radiograph from 11/05/2017. FINDINGS: Median sternotomy wires noted. Underlying cardiomegaly, unchanged. Coronary stent noted. Mediastinal silhouette normal. Aortic atherosclerosis. Lungs hypoinflated. Underlying COPD with irregular biapical pleuroparenchymal scarring, right greater than left. Small layering bilateral pleural effusions, left greater than right. Associated bibasilar  opacities likely atelectasis, although infiltrates could be considered in the correct clinical setting. No overt pulmonary edema. No pneumothorax. No acute osseous abnormality. IMPRESSION: 1. Small bilateral pleural effusions, left greater than right. Associated bibasilar opacities likely reflect atelectasis, although infiltrates could be considered in the correct clinical setting. 2. Cardiomegaly with underlying COPD. 3. Aortic atherosclerosis. Electronically Signed   By: Jeannine Boga M.D.   On: 03/25/2018 18:48   Dg Duanne Limerick W/small Bowel  Result Date: 03/08/2018 CLINICAL DATA:  Generalized abdominal pain. History of COPD and biopsy-proven left lower lobe lung cancer. EXAM: UPPER GI SERIES WITH SMALL BOWEL FOLLOW-THROUGH FLUOROSCOPY TIME:  Fluoroscopy Time:  3 minutes 12 seconds Radiation Exposure Index (if provided by the fluoroscopic device): 217 mGy Number of Acquired Spot Images: 14 TECHNIQUE: Combined double contrast and single contrast upper GI series using effervescent crystals, thick barium, and thin barium. Subsequently, serial images of the small bowel were obtained including spot views of the terminal ileum. COMPARISON:  01/25/2018 CT abdomen.  10/17/2017 PET-CT. FINDINGS: Scout radiograph demonstrates no dilated small bowel loops. Moderate colonic stool. No evidence of pneumatosis or pneumoperitoneum. No radiopaque nephrolithiasis. Evaluation is significantly limited by patient's mobility restrictions. Specifically, evaluation was limited to supine and anterior oblique imaging (patient unable to lie prone or stand for significant period of time). Esophageal motility is grossly normal. No evidence of hiatal hernia. Mild gastroesophageal reflux was elicited to the level of lower thoracic esophagus with water siphon test. Normal esophageal distensibility, with no evidence of esophageal mass, stricture or ulcer. No evidence of gastric fold thickening, ulcers or filling defects. Normal gastric  emptying. Normal duodenal sweep with no evidence of duodenal fold thickening, strictures, ulcers or filling defects. Normal duodenal jejunal junction to the left of the spine. There is normal small bowel transit time of 50 minutes. Normal caliber of the small bowel loops with no small bowel fold thickening, focal caliber changes or fixed positioning. No small bowel filling defects or fistulous. Terminal ileum appears normal. IMPRESSION: 1. Evaluation limited by patient mobility restrictions, see comments. 2. Mild gastroesophageal reflux elicited.  No hiatal hernia. 3. Otherwise unremarkable limited upper GI and small-bowel follow-through. Normal esophageal, gastric and small bowel motility. No evidence of peptic ulcer disease. No evidence of small-bowel obstruction. Electronically Signed   By: Ilona Sorrel M.D.   On: 03/08/2018 10:57   Dg Swallowing Func-speech Pathology  Result Date: 03/24/2018 Objective Swallowing Evaluation: Type of Study: MBS-Modified Barium Swallow Study  Patient Details Name: Timothy BOWEN Sr. MRN: [id number] Date of Birth: 1933-08-18 Today's Date: 03/24/2018  Time: SLP Start Time (ACUTE ONLY): 1030 -SLP Stop Time (ACUTE ONLY): 1055 SLP Time Calculation (min) (ACUTE ONLY): 25 min Past Medical History: Past Medical History: Diagnosis Date . Anemia  . Anginal pain ([place])  . Complication of anesthesia 1987  "didn't wake up for 5 days" . COPD (chronic obstructive pulmonary disease) ([place])  . Coronary artery disease  . Exertional dyspnea 01/04/2012 . GI bleed due to NSAIDs 02/2012 . High cholesterol  . History of blood transfusion 1987; 11/25/2014; 04/05/2018  "emergent CABG; anemia; anemia" . Hyperlipidemia  . Hypothyroidism  . Myocardial infarction ([place]) 1987 . Numbness and tingling in hands 01/04/2012  "right one goes to sleep on steering wheel when I'm driving; been that way long time; @ least 1 yr" . Symptomatic anemia 04/04/2018 . Thyroid disease  Past Surgical History: Past Surgical History:  Procedure Laterality Date . CARDIAC CATHETERIZATION   . CARDIAC CATHETERIZATION N/A 09/10/2014  Procedure: Left Heart Cath and Cors/Grafts Angiography;  Surgeon: Peter M Martinique, MD;  Location: [place] INVASIVE CV LAB CUPID;  Service: Cardiovascular;  Laterality: N/A; . CATARACT EXTRACTION W/ INTRAOCULAR LENS  IMPLANT, BILATERAL Bilateral 2012 . COLONOSCOPY N/A 12/04/2014  Procedure: COLONOSCOPY;  Surgeon: Teena Irani, MD;  [facility] ENDOSCOPY;  Service: Endoscopy;  Laterality: N/A; . COLONOSCOPY W/ POLYPECTOMY  2011  "had to take it out in 3 pieces; all benign" . CORONARY ANGIOPLASTY WITH STENT PLACEMENT  ? 1990's  "3" . CORONARY ANGIOPLASTY WITH STENT PLACEMENT  01/04/2012  "2; total of 5" . CORONARY ARTERY BYPASS GRAFT  1987  CABG X1 . ESOPHAGOGASTRODUODENOSCOPY  02/07/2012  Procedure: ESOPHAGOGASTRODUODENOSCOPY (EGD);  Surgeon: Wonda Horner, MD;  Location: [facility] ENDOSCOPY;  Service: Endoscopy;  Laterality: N/A; . ESOPHAGOGASTRODUODENOSCOPY (EGD) WITH PROPOFOL Left 11/26/2014  Procedure: ESOPHAGOGASTRODUODENOSCOPY (EGD) WITH PROPOFOL;  Surgeon: Arta Silence, MD;  Location: [facility] ENDOSCOPY;  Service: Endoscopy;  Laterality: Left; . PERCUTANEOUS CORONARY STENT INTERVENTION (PCI-S) N/A 01/04/2012  Procedure: PERCUTANEOUS CORONARY STENT INTERVENTION (PCI-S);  Surgeon: Sinclair Grooms, MD;  Location: [facility] CATH LAB;  Service: Cardiovascular;  Laterality: N/A; . WRIST FRACTURE SURGERY Left ~ 2009 . WRIST HARDWARE REMOVAL Left ~ 2010  "took steel bar out" HPI: Pt is an 84 yo male with PMH of chronic recurrent anemia secondary to GI bleed due to NSAID use, recurrent abdominal pain, HLD, CAD, hypothyroidism, lung cancer with previous radiation therapy ending in 2019, and severe COPD, who was admitted for symptomatic anemia and SOB. EGD 11/15 showed esophageal plaques concerning for candidiasis, widely patent Schatzkis ring, food residue in the stomach, and a small HH.  Subjective: pt alert, speech is significantly slurred Assessment /  Plan / Recommendation CHL IP CLINICAL IMPRESSIONS 03/24/2018 Clinical Impression Patient presents with moderate oropharyngeal dysphagia with a reduced cricopharyngeal opening. He required verbal cueing to swallow. He had adequate mastication with poor bolus formation, reduced anterior to posterior transfer and reduced lingual elevation evidenced by mild oral residue.  With all consistencies the swallow was triggered at the valleculae with no epiglottic inversion (epiglottis cannot clear cervical vertebrae) resulting in vallecula pooling (improved by liquid wash). Patient is attempting to compensate absent epiglottic inversion with a hard swallow/pharyngeal squeezing). Pyriform sinus pooling was noted due to reduced cricopharyngus opeing and early closure. There was only one incidence of layngeal penetration (10% of bolus) that went below the cords and then ejected fully. Recommend Dys 2 diet with thin liquids. Compensatory strategies to include 1. reduce environmental distractions, 2. full assistance to verbally cue patient to swallow, 3. present  small sips and bites (alternating). Notified RN of recommendations. Completed education at bedside with large family, specifically pt's daughter, 2 sons and wife. Patient remains a moderate risk for aspiration. Speech therapy to follow up for diet tolerance, review aspiration risk and compensatory training.  SLP Visit Diagnosis Dysphagia, oropharyngeal phase (R13.12);Dysphagia, pharyngoesophageal phase (R13.14) Attention and concentration deficit following -- Frontal lobe and executive function deficit following -- Impact on safety and function Moderate aspiration risk   CHL IP TREATMENT RECOMMENDATION 03/24/2018 Treatment Recommendations Therapy as outlined in treatment plan below   Prognosis 03/24/2018 Prognosis for Safe Diet Advancement Fair Barriers to Reach Goals Severity of deficits Barriers/Prognosis Comment -- CHL IP DIET RECOMMENDATION 03/24/2018 SLP Diet  Recommendations Dysphagia 2 (Fine chop) solids;Thin liquid Liquid Administration via Cup;No straw Medication Administration Whole meds with puree Compensations Slow rate;Small sips/bites;Follow solids with liquid Postural Changes Remain semi-upright after after feeds/meals (Comment);Seated upright at 90 degrees   CHL IP OTHER RECOMMENDATIONS 03/24/2018 Recommended Consults -- Oral Care Recommendations Oral care BID Other Recommendations --   CHL IP FOLLOW UP RECOMMENDATIONS 03/24/2018 Follow up Recommendations Home health SLP   CHL IP FREQUENCY AND DURATION 03/24/2018 Speech Therapy Frequency (ACUTE ONLY) min 2x/week Treatment Duration 2 weeks      CHL IP ORAL PHASE 03/24/2018 Oral Phase Impaired Oral - Pudding Teaspoon Weak lingual manipulation;Incomplete tongue to palate contact;Reduced posterior propulsion Oral - Pudding Cup -- Oral - Honey Teaspoon -- Oral - Honey Cup -- Oral - Nectar Teaspoon -- Oral - Nectar Cup -- Oral - Nectar Straw -- Oral - Thin Teaspoon -- Oral - Thin Cup Weak lingual manipulation;Incomplete tongue to palate contact Oral - Thin Straw -- Oral - Puree -- Oral - Mech Soft Weak lingual manipulation;Incomplete tongue to palate contact;Reduced posterior propulsion Oral - Regular -- Oral - Multi-Consistency -- Oral - Pill -- Oral Phase - Comment --  CHL IP PHARYNGEAL PHASE 03/24/2018 Pharyngeal Phase Impaired Pharyngeal- Pudding Teaspoon Reduced epiglottic inversion;Delayed swallow initiation-vallecula;Reduced laryngeal elevation;Reduced airway/laryngeal closure;Reduced tongue base retraction;Pharyngeal residue - valleculae;Pharyngeal residue - pyriform Pharyngeal -- Pharyngeal- Pudding Cup -- Pharyngeal -- Pharyngeal- Honey Teaspoon -- Pharyngeal -- Pharyngeal- Honey Cup -- Pharyngeal -- Pharyngeal- Nectar Teaspoon -- Pharyngeal -- Pharyngeal- Nectar Cup -- Pharyngeal -- Pharyngeal- Nectar Straw -- Pharyngeal -- Pharyngeal- Thin Teaspoon -- Pharyngeal -- Pharyngeal- Thin Cup Delayed swallow  initiation-vallecula;Reduced epiglottic inversion;Reduced anterior laryngeal mobility;Reduced laryngeal elevation;Reduced airway/laryngeal closure;Reduced tongue base retraction;Trace aspiration;Pharyngeal residue - valleculae;Pharyngeal residue - pyriform Pharyngeal -- Pharyngeal- Thin Straw -- Pharyngeal -- Pharyngeal- Puree -- Pharyngeal -- Pharyngeal- Mechanical Soft Delayed swallow initiation-vallecula;Reduced epiglottic inversion;Reduced anterior laryngeal mobility;Reduced laryngeal elevation;Reduced airway/laryngeal closure;Reduced tongue base retraction;Pharyngeal residue - valleculae;Pharyngeal residue - pyriform Pharyngeal -- Pharyngeal- Regular -- Pharyngeal -- Pharyngeal- Multi-consistency -- Pharyngeal -- Pharyngeal- Pill -- Pharyngeal -- Pharyngeal Comment --  CHL IP CERVICAL ESOPHAGEAL PHASE 03/24/2018 Cervical Esophageal Phase Impaired Pudding Teaspoon Reduced cricopharyngeal relaxation Pudding Cup -- Honey Teaspoon -- Honey Cup -- Nectar Teaspoon -- Nectar Cup -- Nectar Straw -- Thin Teaspoon -- Thin Cup Reduced cricopharyngeal relaxation Thin Straw -- Puree -- Mechanical Soft Reduced cricopharyngeal relaxation Regular -- Multi-consistency -- Pill -- Cervical Esophageal Comment EGD showed esophagitis and patient has know Schatcki's ring, reduced cricopharygeal opening [place], [place], CCC-SLP 03/24/2018 12:16 PM               Labs:  CBC: Recent Labs    03/23/18 0413 03/24/18 0313 03/25/18 0019 04/01/18 0000 04-01-18 0338 April 01, 2018 0811  WBC 10.6* 10.4  --  8.5 8.4  --   HGB 8.3* 7.8* 7.7* 5.2* 6.0* 7.3*  HCT 26.8* 25.9* 25.1* 17.1* 20.3* 22.9*  PLT 284 185  --  13* 14* 13*    COAGS: Recent Labs    11/05/17 0932 03/08/2018 1818 Apr 02, 2018 0811  INR 1.05 1.13 2.20  APTT  --  30 49*    BMP: Recent Labs    03/24/18 0313 03/25/18 0019 04/02/18 0000 04-02-2018 0338  NA 139 133* 141 141  K 3.5 3.1* 3.6 3.6  CL 107 103 112* 112*  CO2 22 25 21* 21*  GLUCOSE 104* 135* 180*  163*  BUN 15 21 41* 44*  CALCIUM 8.5* 8.0* 7.6* 7.7*  CREATININE 0.79 0.78 1.01 0.99  GFRNONAA >60 >60 >60 >60  GFRAA >60 >60 >60 >60    LIVER FUNCTION TESTS: Recent Labs    03/19/2018 1818  BILITOT 0.4  AST 20  ALT 9  ALKPHOS 170*  PROT 7.3  ALBUMIN 3.1*    TUMOR MARKERS: No results for input(s): AFPTM, CEA, CA199, CHROMGRNA in the last 8760 hours.  Assessment and Plan:  Known Hx GI bleeds with AVM clipping 2013 GI bleed + NM study just now Hg 5.2 this am Scheduled for mesenteric arteriogram with possible embolization Risks and benefits of cerebral angiogram with intervention were discussed with the patient's wife including, but not limited to bleeding, infection, vascular injury, contrast induced renal failure, stroke or even death.  This interventional procedure involves the use of X-rays and because of the nature of the planned procedure, it is possible that we will have prolonged use of X-ray fluoroscopy.  Potential radiation risks to you include (but are not limited to) the following: - A slightly elevated risk for cancer  several years later in life. This risk is typically less than 0.5% percent. This risk is low in comparison to the normal incidence of human cancer, which is 33% for women and 50% for men according to the [place]. - Radiation induced injury can include skin redness, resembling a rash, tissue breakdown / ulcers and hair loss (which can be temporary or permanent).   The likelihood of either of these occurring depends on the difficulty of the procedure and whether you are sensitive to radiation due to previous procedures, disease, or genetic conditions.   IF your procedure requires a prolonged use of radiation, you will be notified and given written instructions for further action.  It is your responsibility to monitor the irradiated area for the 2 weeks following the procedure and to notify your physician if you are concerned that you  have suffered a radiation induced injury.    All of the patient's questions were answered, patient is agreeable to proceed.  Consent signed and in chart.  Thank you for this interesting consult.  I greatly enjoyed meeting Timothy BENNIS Sr. and look forward to participating in their care.  A copy of this report was sent to the requesting provider on this date.  Electronically Signed: Lavonia Drafts, PA-C 02-Apr-2018, 3:16 PM   I spent a total of 40 Minutes    in face to face in clinical consultation, greater than 50% of which was counseling/coordinating care for mesenteric arteriogram with embolization

## 2018-04-07 NOTE — Progress Notes (Addendum)
CRITICAL VALUE ALERT  Critical Value: Platelets 13  Date & Time Notied: April 04, 2018 0940  Provider Notified: Candiss Norse  Orders Received/Actions taken: Currently receiving Platelets.

## 2018-04-07 NOTE — Progress Notes (Addendum)
Pt had an episode of unresponsiveness and decreased LOC. Also had a large bloody stool and incontinence while receiving a bath. Returned to baseline quickly afterwards.BP subsequently dropped to 87/40 MAP 55. Ordered a 1L NS Bolus, Stat H&H, as well as type and screen with preparation of 2 units of PRBC's. Will transfuse for HGB less than 7.5. Placed pt on a protonix gtt due to bloody stool and prior history of AVM bleed.    Upon entering the room pt laying in bed resting in NAD. He arouses easily to voice and light touch. Follows simple commands.Abdomen soft and non-tender with positive bowel sounds. Noticed crackles in LLL but small left pleural effusion noted on CXR from 11/18.   Family at bedside and present with pt. Discussed the event and current course of treatment and they were agreeable to the plan. Addressed all questions and concerns.  Arby Barrette AGPCNP-BC, AGNP-C [facility] Pager [phone]   Addendum: Hgb 5.2. Ordered to Transfuse 2 units of PRBC's and repeat H&H once complete.  Pt has had another medium sized bloody BM and remains hypotensive with SBP in the 90's. Currently receiving his second unit of blood. GI paged and awaiting response.  Also pts 02 sats began to drop and he was subsequently placed on NRB. Stat ABG and CXR have been ordered.

## 2018-04-07 NOTE — Progress Notes (Signed)
Received patient to 57mo5 in bed on monitor and with nonrebreather patient was not responsive with sbp 40 and heart rate 45 . I called 5 [place] for family to come to beside and patient passed at 31 with family surrounding him. No heart sounds auscultated by myself and Lucina Mellow . Dr Candiss Norse notified and he came to bedside to talk with the family. Chaplain services were also called to the bedside

## 2018-04-07 NOTE — Progress Notes (Addendum)
Patient had large bloody bowel movements throughout the morning. 2/2 units PRBCs given and 1/2 unit platelets. Received order for 5 units plasma but PRBCs was still infusing. BP 73/35. MD notified and was told to go ahead and infuse PRBCs and plasma the same time. Patient brought to IR for emergent embolization with an Therapist, sports. Plasma given to IR RN to initiate. Received call from IR nurse that patient went unconscious when put on the table and that they were returning back to the unit. Patient had current bed assignment on 38M prior to return to 5W. Patient transported to 38M by IR nurse and rapid response RN. Report called to 38M. Belongings returned to family.

## 2018-04-07 DEATH — deceased

## 2018-04-18 ENCOUNTER — Ambulatory Visit: Payer: Self-pay | Admitting: Radiation Oncology

## 2020-05-07 IMAGING — CT NM PET TUM IMG INITIAL (PI) SKULL BASE T - THIGH
1 of 8 series · 1 of 25 positions shown · non-contrast
Comparison: CT chest 10/05/2017

CLINICAL DATA: Initial treatment strategy for lung nodule.

EXAM:
NUCLEAR MEDICINE PET SKULL BASE TO THIGH
TECHNIQUE: 6.4 mCi F-18 FDG was injected intravenously. Full-ring PET imaging
was performed from the skull base to thigh after the radiotracer. CT
data was obtained and used for attenuation correction and anatomic
localization.
Fasting blood glucose: 98 mg/dl

[Series 4: ct sk_thigh 5.0 b31f · axial · 5.0mm · 0.76mm/px · 1 of 223 slices shown]
[im 223/223  brain]
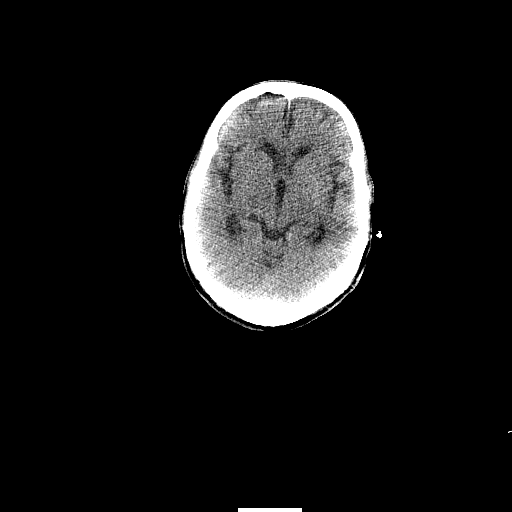

[1 of 25 positions shown; findings below may reference images not displayed]

FINDINGS: Mediastinal blood pool activity: SUV max

NECK: No hypermetabolic lymph nodes in the neck.

Incidental CT findings: none

CHEST: The posterior left lower lobe lung nodule measures 1.5 by
cm and has an SUV max of 5.99. No additional hypermetabolic
pulmonary nodules. No hypermetabolic thoracic lymph nodes.

Incidental CT findings: Changes of chronic interstitial lung disease
are again noted. Findings likely reflect a combination of severe
centrilobular emphysema and usual interstitial pneumonia.

Aortic atherosclerosis noted.  Previous CABG procedure.

ABDOMEN/PELVIS: No abnormal hypermetabolic activity within the
liver, pancreas, adrenal glands, or spleen. No hypermetabolic lymph
nodes in the abdomen or pelvis.

Incidental CT findings: There is aortic atherosclerosis. No
aneurysm.

SKELETON: No focal hypermetabolic activity to suggest skeletal
metastasis.

Incidental CT findings: none
IMPRESSION: 1. Moderate FDG uptake associated with the irregular enlarging
subpleural nodule within the posterior left lower lobe with an SUV
max of 5.99. Suspicious for malignancy. Correlation with tissue
sampling advise.
2. Aortic Atherosclerosis ([id number]-[id number].L) and Emphysema ([id number]-[id number]).
3. Chronic interstitial lung disease suggestive of usual
interstitial pneumonia (UIP).
